# Patient Record
Sex: Female | Born: 1990
Health system: Southern US, Community
[De-identification: ages and names within clinical notes are randomized; demographics above are authoritative.]

## PROBLEM LIST (undated history)

## (undated) ENCOUNTER — Inpatient Hospital Stay (HOSPITAL_COMMUNITY): Payer: Self-pay

## (undated) DIAGNOSIS — R51 Headache: Secondary | ICD-10-CM

## (undated) DIAGNOSIS — T8859XA Other complications of anesthesia, initial encounter: Secondary | ICD-10-CM

## (undated) DIAGNOSIS — R7303 Prediabetes: Secondary | ICD-10-CM

## (undated) DIAGNOSIS — E669 Obesity, unspecified: Secondary | ICD-10-CM

## (undated) DIAGNOSIS — Z789 Other specified health status: Secondary | ICD-10-CM

## (undated) DIAGNOSIS — J189 Pneumonia, unspecified organism: Secondary | ICD-10-CM

## (undated) DIAGNOSIS — R519 Headache, unspecified: Secondary | ICD-10-CM

## (undated) HISTORY — PX: KNEE SURGERY: SHX244

## (undated) HISTORY — PX: INCISION AND DRAINAGE: SHX5863

## (undated) HISTORY — PX: ANTERIOR CRUCIATE LIGAMENT REPAIR: SHX115

---
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
## 2010-08-07 ENCOUNTER — Emergency Department (HOSPITAL_COMMUNITY): Admission: EM | Admit: 2010-08-07 | Discharge: 2010-08-07 | Payer: Self-pay | Admitting: Emergency Medicine

## 2010-12-09 ENCOUNTER — Emergency Department (HOSPITAL_COMMUNITY)
Admission: EM | Admit: 2010-12-09 | Discharge: 2010-12-09 | Payer: Self-pay | Source: Home / Self Care | Admitting: Family Medicine

## 2011-01-29 ENCOUNTER — Encounter: Payer: Self-pay | Admitting: *Deleted

## 2011-04-25 NOTE — Op Note (Signed)
Shannon Francis, Shannon Francis             ACCOUNT NO.:  [id number]   MEDICAL RECORD NO.:  [id number]          PATIENT TYPE:  AMB   LOCATION:  DSC                          FACILITY:  [facility]   PHYSICIAN:  Robert A. Thurston Hole, M.D. DATE OF BIRTH:  [birth year]   DATE OF PROCEDURE:  05/27/2007  DATE OF DISCHARGE:                               OPERATIVE REPORT   PREOPERATIVE DIAGNOSES:  1. Left knee anterior cruciate ligament deficiency.  2. Left knee medial lateral meniscal tears.   POSTOPERATIVE DIAGNOSES:  1. Left knee anterior cruciate ligament deficiency.  2. Left knee medial lateral meniscal tears.   PROCEDURE PERFORMED:  1. Left knee examination under anesthesia followed by arthroscopically      assisted endoscopic bone patellar tendon/bone allograft; anterior      cruciate ligament reconstruction using an 8 x 25 mm femoral      interference titanium screw, and an 8 x 25 mm tibial interference      BioScrew.  2. Left knee medial and lateral partial meniscectomies.   SURGEON:  Elana Alm. Thurston Hole, M.D.   ASSISTANT SURGEON:  Julien Girt, P.A.   ANESTHESIA:  General.   OPERATIVE TIME:  One hour and 30 minutes.   COMPLICATIONS:  None.   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  She  did well after this and then, over the last 3-6 months, had while  playing basketball again recurrent injuries to her left knee with  increased pain and swelling, with a new MRI scan showing ACL tear with  meniscal tearing.  She has failed conservative care and is now to  undergo arthroscopy with ACL reconstruction.   DESCRIPTION OF PROCEDURE:  Shannon Francis was brought into the operating room  on May 27, 2007.  A left anterior femoral block had been placed in the  holding  area by Anesthesia.  She was placed on the operative table in  the supine position.  After being placed under general anesthesia, her  left knee was examined.  She had full range of motion, 2 to 3+  Lachman's, positive pivot shift, and the knee stable to varus, valgus  and posterior stress, with normal patellar tracking.  The knee was  sterilely injected with 0.25% Marcaine with epinephrine.  She had  received Ancef 1 gm IV preoperatively for prophylaxis.  Her left leg was  then prepped using sterile DuraPrep and draped using sterile technique.   Originally through an anterolateral portal, the arthroscope with a pump  attached was placed, and through a anteromedial portal an arthroscope  probe was placed.  On initial inspection of the medial compartment, she  was found to have 25% grade III chondromalacia on the medial femoral  condyle which was debrided; only grade I-II changes medial tibial  plateau.  The medial meniscus, of which she had a previous partial  medial meniscectomy, had 75% remaining, and there was  tearing of another  25% of this posterior horn which was resected back to a stable rim.  The  intercondylar notch was inspected.  The ACL was found to be torn and  scarred and the notch with significant anterior laxity.  This was  thoroughly debrided and a notchplasty was performed.  The posterior  cruciate was intact and stable.   The lateral compartment was inspected, articular cartilage showed only  mild grade I-II chondromalacia.  The lateral meniscus showed a tear of  the posterior and lateral horn, of which 25% was resected back to a  stable rim.  The patellofemoral joint showed grade I-II chondromalacia.  The patella tracked normally.  The medial and lateral gutters had  moderate synovitis which was debrided.  Otherwise they were free of  pathology.   At this point, then, Julien Girt, whose expert surgical  assistance was necessary to prepare the ACL allograft on  the back table  while I proceeded to prepare the inside of the knee to accept the graft.  She prepared a 10 x 25 mm graft of patellar bone and tibial tubercle  bone in a tube 12 mm graft.  During this time I proceeded through a  separate anteromedial portal and a 1.5 cm anteromedial proximal tibial  incision using a tibial drill guide, a Steinmann pin was drilled up into  the ACL insertion point on the tibial plateau, and then overdrilled with  a 10 mm drill.  Through this tibial tunnel, the posterior femoral guide  was placed in the posterior femoral notch, the Steinmann drilled up into  the ACL origin point, and then overdrilled with a 10 mm drill to a depth  of 30 mm, leaving a posterior 2 mm of bone bridge.  A double Pentasa was  brought up through the tibial tunnel and joining up through the femoral  tunnel and through the femoral cortex and tied through a stab wound.  This was used to pass the ACL allograft up through the tibial tunnel and  joining into the femoral tunnel.  It was locked into position there with  an 8 x 25 mm interference titanium screw.   The knee was then brought through a full range of motion.  There was no  impingement of the graft.  The tibial bone plug was then looked in its  tunnel with an 8 x 25 mm interference BioScrew with the knee in 30  degrees of flexion and the tibia held reduced on the femur.  After this  was done, the knee was tested for stability.  Lachman's and pivot shift  were found to be totally eliminated, and the knee could be brought  through a full range of motion with no impingement on the graft.  At  this point it was felt that all pathology had been satisfactorily  addressed.  The instruments were removed.  The incisions were closed  with 0 and 2-0 Vicryl and 4-0 Prolene.  The arthroscopic portal was  closed with 3-0 nylon.  The knee was injected with 0.25% Marcaine with epinephrine and 4 mg of morphine.  Sterile dressings and a  long-leg  splint were applied, and then the patient was awakened and taken to the  recovery room in stable condition.   FOLLOW-UP:  Dannah will be followed overnight in the recovery care  center for IV pain control and neurovascular monitoring and CPM use.   DISPOSITION:  To be discharged tomorrow on Percocet and Robaxin with a  home  CPM.  She will be seen back in the office in a week for sutures out  and follow-up.      Robert A. Thurston Hole, M.D.  Electronically Signed     RAW/MEDQ  D:  05/27/2007  T:  05/27/2007  Job:  [id number]

## 2011-04-28 NOTE — Op Note (Signed)
Shannon Francis, Shannon Francis             ACCOUNT NO.:  [id number]   MEDICAL RECORD NO.:  [id number]          PATIENT TYPE:  AMB   LOCATION:  DSC                          FACILITY:  [facility]   PHYSICIAN:  Robert A. Thurston Hole, M.D. DATE OF BIRTH:  June 28, 1991   DATE OF PROCEDURE:  10/24/2004  DATE OF DISCHARGE:                                 OPERATIVE REPORT   PREOPERATIVE DIAGNOSES:  Left knee partial versus complete anterior cruciate  ligament tear with medial and lateral meniscal tears.   POSTOPERATIVE DIAGNOSES:  1.  Left knee lateral partial anterior cruciate ligament tear.  2.  Left knee medial and lateral meniscal tears.   PROCEDURE:  Left knee examination under anesthesia followed by arthroscopic  partial medial and lateral meniscectomies.   SURGEON:  Elana Alm. Thurston Hole, M.D.   ASSISTANT:  Julien Girt, P.A.   ANESTHESIA:  General.   OPERATIVE TIME:  30 minutes.   COMPLICATIONS:  None.   INDICATIONS FOR PROCEDURE:  Shannon Francis is a 20 year old who injured her left  knee approximately 2-1/2 months ago.  Significant pain with exam and MRI  documenting meniscal tearing and possible ACL tear, and is now to undergo  arthroscopy.   DESCRIPTION OF PROCEDURE:  Shannon Francis was brought to the operating room on  October 24, 2004, placed on the operating table in the supine position.  After an adequate level of general anesthesia was obtained, her left knee  was examined under anesthesia. Range of motion from 0-125 degrees, she had a  1-2+ Lachman but she did have an endpoint.  She had a negative pivot shift.  Her knee was stable to varus, valgus and posterior stress with normal  patellar tracking.  The knee was sterilely injected with 0.25% Marcaine with  epinephrine.  Left leg was prepped using sterile DuraPrep and draped using  sterile technique.   Originally, through an anterolateral portal, the arthroscope with a pump  attached was placed through an anteromedial portal and arthroscopic  probe  was placed.  On initial inspection of the medial compartment, the articular  cartilage was normal.  The medial meniscus showed a tear of the posterior  and medial horn, a complex type non-repairable tear, 50% of the posterior  horn was resected back to a stable rim. The intercondylar notch was  inspected, the anterior cruciate ligament had a partial tear of the  posteromedial bundle but not of the anterolateral bundle, and there was 3-4  mm of anxiety laxity; but there was a firm endpoint and thus I did not feel  an ACL reconstruction was indicated.  Posterior cruciate was intact and  stable.   The lateral compartment was then inspected. The articular cartilage was  normal.  Lateral meniscus showed a radial type tear of the posterolateral  corner of which 20% was resected back to a stable rim.   Patellofemoral joint was inspected. The articular cartilage in this joint  was normal.  The patella tracked normally.  The articular cartilage in the  lateral compartment was also inspected. This was found to be normal.  The medial and lateral gutters were free of  pathology.   After this was done, it was felt that all pathology had been satisfactorily  addressed. The instruments were removed. The portals were closed with 3-0  nylon suture.  Sterile dressings were applied.  The patient was awakened and  taken to the recovery room in stable condition.   FOLLOW UP CARE:  She will be treated as an outpatient on Vicodin and  Naprosyn. I will see her back in the office in a week for sutures out and  follow up.       RAW/MEDQ  D:  10/24/2004  T:  10/24/2004  Job:  [id number]

## 2011-05-09 ENCOUNTER — Emergency Department (HOSPITAL_COMMUNITY)
Admission: EM | Admit: 2011-05-09 | Discharge: 2011-05-10 | Disposition: A | Discharge status: Home / Self Care | Payer: Medicaid Other | Attending: Emergency Medicine | Admitting: Emergency Medicine

## 2011-05-09 DIAGNOSIS — IMO0002 Reserved for concepts with insufficient information to code with codable children: Secondary | ICD-10-CM | POA: Insufficient documentation

## 2011-05-09 DIAGNOSIS — H9209 Otalgia, unspecified ear: Secondary | ICD-10-CM | POA: Insufficient documentation

## 2011-05-09 DIAGNOSIS — S0003XA Contusion of scalp, initial encounter: Secondary | ICD-10-CM | POA: Insufficient documentation

## 2011-05-09 DIAGNOSIS — E669 Obesity, unspecified: Secondary | ICD-10-CM | POA: Insufficient documentation

## 2011-08-12 ENCOUNTER — Ambulatory Visit (INDEPENDENT_AMBULATORY_CARE_PROVIDER_SITE_OTHER): Payer: Medicaid Other

## 2011-08-12 ENCOUNTER — Inpatient Hospital Stay (INDEPENDENT_AMBULATORY_CARE_PROVIDER_SITE_OTHER)
Admission: RE | Admit: 2011-08-12 | Discharge: 2011-08-12 | Disposition: A | Discharge status: Home / Self Care | Payer: Medicaid Other | Source: Ambulatory Visit | Attending: Emergency Medicine | Admitting: Emergency Medicine

## 2011-08-12 DIAGNOSIS — S90129A Contusion of unspecified lesser toe(s) without damage to nail, initial encounter: Secondary | ICD-10-CM

## 2011-11-01 ENCOUNTER — Encounter: Payer: Self-pay | Admitting: *Deleted

## 2011-11-01 ENCOUNTER — Emergency Department (HOSPITAL_COMMUNITY): Payer: Medicaid Other

## 2011-11-01 ENCOUNTER — Emergency Department (HOSPITAL_COMMUNITY)
Admission: EM | Admit: 2011-11-01 | Discharge: 2011-11-01 | Disposition: A | Discharge status: Home / Self Care | Payer: Medicaid Other | Attending: Emergency Medicine | Admitting: Emergency Medicine

## 2011-11-01 DIAGNOSIS — Y92009 Unspecified place in unspecified non-institutional (private) residence as the place of occurrence of the external cause: Secondary | ICD-10-CM | POA: Insufficient documentation

## 2011-11-01 DIAGNOSIS — S161XXA Strain of muscle, fascia and tendon at neck level, initial encounter: Secondary | ICD-10-CM

## 2011-11-01 DIAGNOSIS — M545 Low back pain, unspecified: Secondary | ICD-10-CM | POA: Insufficient documentation

## 2011-11-01 DIAGNOSIS — Y998 Other external cause status: Secondary | ICD-10-CM | POA: Insufficient documentation

## 2011-11-01 DIAGNOSIS — S139XXA Sprain of joints and ligaments of unspecified parts of neck, initial encounter: Secondary | ICD-10-CM | POA: Insufficient documentation

## 2011-11-01 DIAGNOSIS — S86919A Strain of unspecified muscle(s) and tendon(s) at lower leg level, unspecified leg, initial encounter: Secondary | ICD-10-CM

## 2011-11-01 DIAGNOSIS — IMO0002 Reserved for concepts with insufficient information to code with codable children: Secondary | ICD-10-CM | POA: Insufficient documentation

## 2011-11-01 DIAGNOSIS — Z79899 Other long term (current) drug therapy: Secondary | ICD-10-CM | POA: Insufficient documentation

## 2011-11-01 MED ORDER — CYCLOBENZAPRINE HCL 10 MG PO TABS: 10.0000 mg | ORAL_TABLET | Freq: Two times a day (BID) | ORAL | Status: DC | PRN

## 2011-11-01 MED ORDER — HYDROCODONE-ACETAMINOPHEN 5-325 MG PO TABS: 2.0000 | ORAL_TABLET | ORAL | Status: DC | PRN

## 2011-11-01 NOTE — ED Notes (Signed)
Pt c/o left neck pain and left knee pain. No swelling or deformity noted. Pt is alert and oriented. Denies LOC. c Collar and spine board removed by dr. Rennis Chris on arrival.

## 2011-11-01 NOTE — ED Notes (Signed)
Patient transported to X-ray 
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
## 2011-11-09 ENCOUNTER — Emergency Department (HOSPITAL_COMMUNITY)
Admission: EM | Admit: 2011-11-09 | Discharge: 2011-11-10 | Disposition: A | Discharge status: Home / Self Care | Payer: Medicaid Other | Attending: Emergency Medicine | Admitting: Emergency Medicine

## 2011-11-09 ENCOUNTER — Encounter (HOSPITAL_COMMUNITY): Payer: Self-pay | Admitting: Emergency Medicine

## 2011-11-09 DIAGNOSIS — M79609 Pain in unspecified limb: Secondary | ICD-10-CM | POA: Insufficient documentation

## 2011-11-09 DIAGNOSIS — M545 Low back pain, unspecified: Secondary | ICD-10-CM | POA: Insufficient documentation

## 2011-11-09 DIAGNOSIS — M549 Dorsalgia, unspecified: Secondary | ICD-10-CM | POA: Insufficient documentation

## 2011-11-09 DIAGNOSIS — Z79899 Other long term (current) drug therapy: Secondary | ICD-10-CM | POA: Insufficient documentation

## 2011-11-09 HISTORY — DX: Obesity, unspecified: E66.9
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
## 2011-11-10 MED ORDER — CYCLOBENZAPRINE HCL 10 MG PO TABS
10.0000 mg | ORAL_TABLET | Freq: Once | ORAL | Status: AC
  Administered 2011-11-10: 10 mg via ORAL
  Filled 2011-11-10: qty 1

## 2011-11-10 MED ORDER — IBUPROFEN 800 MG PO TABS: 800.0000 mg | ORAL_TABLET | Freq: Three times a day (TID) | ORAL | Status: AC

## 2011-11-10 MED ORDER — CYCLOBENZAPRINE HCL 10 MG PO TABS: 10.0000 mg | ORAL_TABLET | Freq: Three times a day (TID) | ORAL | Status: AC | PRN

## 2011-11-10 MED ORDER — IBUPROFEN 800 MG PO TABS
800.0000 mg | ORAL_TABLET | Freq: Once | ORAL | Status: AC
  Administered 2011-11-10: 800 mg via ORAL
  Filled 2011-11-10: qty 1

## 2011-11-10 NOTE — ED Provider Notes (Signed)
History     CSN: [id number] Arrival date & time: 11/09/2011  7:33 PM   First MD Initiated Contact with Patient 11/10/11 0013      Chief Complaint  Patient presents with  . Back Pain  SUBJECTIVE:  Shannon Francis is a 20 y.o. female who complains of an injury causing low back pain 3 weeks ago. The pain is positional with bending or lifting, with radiation down the legs. Mechanism of injury: none. Symptoms have been constant since that time. Prior history of back problems: no prior back problems and recurrent self limited episodes of low back pain in the past. There is no numbness in the legs.  OBJECTIVE: Vital signs as noted above. Patient appears to be in mild to moderate pain, antalgic gait noted. Lumbosacral spine area reveals no local tenderness or mass. Painful and reduced LS ROM noted. Straight leg raise is positive at 30 degrees on right. DTR's, motor strength and sensation normal, including heel and toe gait.  Peripheral pulses are palpable. Lumbar spine X-Ray: not indicated.   ASSESSMENT:  lumbar strain  PLAN: For acute pain, rest, intermittent application of cold packs (later, may switch to heat, but do not sleep on heating pad), analgesics and muscle relaxants are recommended. Discussed longer term treatment plan of prn NSAID's and discussed a home back care exercise program with flexion exercise routine. Proper lifting with avoidance of heavy lifting discussed. Consider Physical Therapy and XRay studies if not improving. Call or return to clinic prn if these symptoms worsen or fail to improve as anticipated.  HPI  Past Medical History  Diagnosis Date  . Obesity     Past Surgical History  Procedure Date  . Knee surgery     History reviewed. No pertinent family history.  History  Substance Use Topics  . Smoking status: Never Smoker   . Smokeless tobacco: Not on file  . Alcohol Use: No    OB History    Grav Para Term Preterm Abortions TAB SAB Ect Mult Living                 Review of Systems  Constitutional: Negative for fever and chills.  HENT: Negative.   Respiratory: Negative.   Cardiovascular: Negative.   Gastrointestinal: Negative.  Negative for nausea.  Musculoskeletal: Positive for back pain.  Skin: Negative.   Neurological: Negative.  Negative for numbness and headaches.    Allergies  Review of patient's allergies indicates no known allergies.  Home Medications   Current Outpatient Rx  Name Route Sig Dispense Refill  . CYCLOBENZAPRINE HCL 10 MG PO TABS Oral Take 10 mg by mouth 2 (two) times daily as needed. FOR MUSCLE SPASMS     . HYDROCODONE-ACETAMINOPHEN 5-325 MG PO TABS Oral Take 2 tablets by mouth every 4 (four) hours as needed. FOR PAIN       BP 122/91  Pulse 97  Temp(Src) 98.3 F (36.8 C) (Oral)  Resp 18  SpO2 99%  LMP 10/09/2011  Physical Exam  Constitutional: She appears well-developed and well-nourished.  HENT:  Head: Normocephalic.  Neck: Normal range of motion. Neck supple.  Pulmonary/Chest: Effort normal.  Abdominal: Soft. Bowel sounds are normal. There is no tenderness. There is no rebound and no guarding.  Musculoskeletal: Normal range of motion. She exhibits no edema.       Mild lumbar and paralumbar tenderness without discoloration.  Neurological: She is alert. No cranial nerve deficit. Coordination normal.  Skin: Skin is warm and dry. No rash  noted.  Psychiatric: She has a normal mood and affect.    ED Course  Procedures (including critical care time)  Labs Reviewed - No data to display No results found.   No diagnosis found.    MDM          Rodena Medin, PA 11/10/11 [phone]

## 2011-11-10 NOTE — ED Provider Notes (Signed)
Medical screening examination/treatment/procedure(s) were performed by non-physician practitioner and as supervising physician I was immediately available for consultation/collaboration.   Lyanne Co, MD 11/10/11 [phone]

## 2011-12-01 ENCOUNTER — Emergency Department (INDEPENDENT_AMBULATORY_CARE_PROVIDER_SITE_OTHER)
Admission: EM | Admit: 2011-12-01 | Discharge: 2011-12-01 | Disposition: A | Discharge status: Home / Self Care | Payer: Medicaid Other | Source: Home / Self Care | Attending: Emergency Medicine | Admitting: Emergency Medicine

## 2011-12-01 ENCOUNTER — Encounter (HOSPITAL_COMMUNITY): Payer: Self-pay | Admitting: *Deleted

## 2011-12-01 DIAGNOSIS — N760 Acute vaginitis: Secondary | ICD-10-CM

## 2011-12-01 DIAGNOSIS — A499 Bacterial infection, unspecified: Secondary | ICD-10-CM

## 2011-12-01 DIAGNOSIS — L600 Ingrowing nail: Secondary | ICD-10-CM

## 2011-12-01 DIAGNOSIS — B9689 Other specified bacterial agents as the cause of diseases classified elsewhere: Secondary | ICD-10-CM

## 2011-12-01 LAB — POCT URINALYSIS DIP (DEVICE)
Ketones, ur: NEGATIVE mg/dL
Specific Gravity, Urine: 1.025 (ref 1.005–1.030)
pH: 6.5 (ref 5.0–8.0)

## 2011-12-01 LAB — POCT PREGNANCY, URINE: Preg Test, Ur: NEGATIVE

## 2011-12-01 LAB — WET PREP, GENITAL

## 2011-12-01 MED ORDER — MUPIROCIN 2 % EX OINT: TOPICAL_OINTMENT | Freq: Three times a day (TID) | CUTANEOUS | Status: AC

## 2011-12-01 MED ORDER — METRONIDAZOLE 500 MG PO TABS: 500.0000 mg | ORAL_TABLET | Freq: Two times a day (BID) | ORAL | Status: AC

## 2011-12-01 MED ORDER — CEPHALEXIN 500 MG PO CAPS: 500.0000 mg | ORAL_CAPSULE | Freq: Three times a day (TID) | ORAL | Status: AC

## 2011-12-01 NOTE — ED Notes (Signed)
Pt  Has symptoms  Of  Vaginal bleeding  And  White  Discharge     Which  She  Noticed  Last  Week  -  She  Also    Reports  She  stunbbed  Her l  Big toe  yest    And  It is  painfull

## 2011-12-01 NOTE — ED Provider Notes (Signed)
History     CSN: [id number]  Arrival date & time 12/01/11  1743   First MD Initiated Contact with Patient 12/01/11 1749      Chief Complaint  Patient presents with  . Vaginal Discharge    (Consider location/radiation/quality/duration/timing/severity/associated sxs/prior treatment) HPI Comments: [name] is a 20 year old female who is in today for 2 problems: Vaginal discharge and left great toe pain.  The vaginal discharge has been going on for a week. The discharge is white and she denies any odor, itching, burning, or pain. She has no external lesions. No pelvic pain, fever, chills, nausea, or vomiting. Her menses are somewhat irregular. Last menstrual period was November 24. She is sexually active and uses condoms for birth control although not consistently.  The second problem is pain over the lateral nail fold of left great toe. This seemed to come on yesterday. She thinks she might of stubbed it. It's not been draining a pus. It has been tender to touch.  Patient is a 20 y.o. female presenting with vaginal discharge.  Vaginal Discharge Pertinent negatives include no abdominal pain.    Past Medical History  Diagnosis Date  . Obesity     Past Surgical History  Procedure Date  . Knee surgery     History reviewed. No pertinent family history.  History  Substance Use Topics  . Smoking status: Never Smoker   . Smokeless tobacco: Not on file  . Alcohol Use: No    OB History    Grav Para Term Preterm Abortions TAB SAB Ect Mult Living                  Review of Systems  Constitutional: Negative for fever and chills.  Gastrointestinal: Negative for nausea, vomiting, abdominal pain and diarrhea.  Genitourinary: Positive for vaginal discharge. Negative for dysuria, urgency, frequency, hematuria, vaginal bleeding, difficulty urinating, genital sores, vaginal pain, menstrual problem, pelvic pain and dyspareunia.    Allergies  Hydrocodone  Home Medications    Current Outpatient Rx  Name Route Sig Dispense Refill  . CEPHALEXIN 500 MG PO CAPS Oral Take 1 capsule (500 mg total) by mouth 3 (three) times daily. 30 capsule 0  . METRONIDAZOLE 500 MG PO TABS Oral Take 1 tablet (500 mg total) by mouth 2 (two) times daily. 14 tablet 0  . MUPIROCIN 2 % EX OINT Topical Apply topically 3 (three) times daily. 22 g 0    BP 139/94  Pulse 74  Temp(Src) 98.3 F (36.8 C) (Oral)  Resp 20  SpO2 100%  Physical Exam  Nursing note and vitals reviewed. Constitutional: She appears well-developed and well-nourished. No distress.  Cardiovascular: Normal rate, regular rhythm, normal heart sounds and intact distal pulses.  Exam reveals no gallop and no friction rub.   No murmur heard. Pulmonary/Chest: Effort normal and breath sounds normal. No respiratory distress. She has no wheezes. She has no rales.  Abdominal: Soft. Bowel sounds are normal. She exhibits no distension and no mass. There is no tenderness. There is no rebound and no guarding.  Genitourinary: Uterus normal. There is no rash or lesion on the right labia. There is no rash or lesion on the left labia. Uterus is not deviated, not enlarged, not fixed and not tender. Cervix exhibits no motion tenderness, no discharge and no friability. Right adnexum displays no mass and no tenderness. Left adnexum displays no mass and no tenderness. No erythema, tenderness or bleeding around the vagina. No foreign body around the vagina. Vaginal discharge  found.       Pelvic exam reveals normal external genitalia. There was a small amount of dark blood in the vaginal vault. There was no clotting. She has very little discharge but did have some odor. The cervix otherwise appeared normal. There was no cervical motion tenderness. Uterus was midposition and nontender to palpation. No adnexal masses or tenderness.  Musculoskeletal: She exhibits tenderness.       Exam of the left great toe reveals an ingrown toenail with slight  inflammation and pain to palpation over the lateral nail fold. There was no purulent drainage and no granulation tissue.  Skin: Skin is warm and dry. No rash noted. She is not diaphoretic.    ED Course  Procedures (including critical care time)  Results for orders placed during the hospital encounter of 12/01/11  POCT URINALYSIS DIP (DEVICE)      Component Value Range   Glucose, UA NEGATIVE  NEGATIVE (mg/dL)   Bilirubin Urine SMALL (*) NEGATIVE    Ketones, ur NEGATIVE  NEGATIVE (mg/dL)   Specific Gravity, Urine 1.025  1.005 - 1.030    Hgb urine dipstick LARGE (*) NEGATIVE    pH 6.5  5.0 - 8.0    Protein, ur 100 (*) NEGATIVE (mg/dL)   Urobilinogen, UA 1.0  0.0 - 1.0 (mg/dL)   Nitrite NEGATIVE  NEGATIVE    Leukocytes, UA TRACE (*) NEGATIVE   POCT PREGNANCY, URINE      Component Value Range   Preg Test, Ur NEGATIVE       Labs Reviewed  POCT URINALYSIS DIP (DEVICE) - Abnormal; Notable for the following:    Bilirubin Urine SMALL (*)    Hgb urine dipstick LARGE (*)    Protein, ur 100 (*)    Leukocytes, UA TRACE (*) Biochemical Testing Only. Please order routine urinalysis from main lab if confirmatory testing is needed.   All other components within normal limits  POCT PREGNANCY, URINE  POCT URINALYSIS DIPSTICK  POCT PREGNANCY, URINE  GC/CHLAMYDIA PROBE AMP, GENITAL  WET PREP, GENITAL   No results found.   1. Bacterial vaginosis   2. Ingrown toenail       MDM  Her discharge appears to be do to bacterial vaginosis. We'll treat with metronidazole and I told her we would give her a call if anything else shows up.  She has a mild ingrown toenail. She hasn't tried any conservative treatment yet, so we'll treat with warm soaks, cephalexin, and mupirocin ointment. We will told her if no better in 10 days to return again for nail splitting procedure.        Roque Lias, MD 12/01/11 [phone]

## 2011-12-02 LAB — GC/CHLAMYDIA PROBE AMP, GENITAL: GC Probe Amp, Genital: NEGATIVE

## 2011-12-10 ENCOUNTER — Encounter (HOSPITAL_COMMUNITY): Payer: Self-pay | Admitting: *Deleted

## 2011-12-10 ENCOUNTER — Emergency Department (HOSPITAL_COMMUNITY)
Admission: EM | Admit: 2011-12-10 | Discharge: 2011-12-10 | Disposition: A | Discharge status: Home / Self Care | Payer: Medicaid Other | Attending: Emergency Medicine | Admitting: Emergency Medicine

## 2011-12-10 DIAGNOSIS — R509 Fever, unspecified: Secondary | ICD-10-CM | POA: Insufficient documentation

## 2011-12-10 DIAGNOSIS — H9201 Otalgia, right ear: Secondary | ICD-10-CM

## 2011-12-10 DIAGNOSIS — R51 Headache: Secondary | ICD-10-CM | POA: Insufficient documentation

## 2011-12-10 DIAGNOSIS — R070 Pain in throat: Secondary | ICD-10-CM | POA: Insufficient documentation

## 2011-12-10 DIAGNOSIS — J069 Acute upper respiratory infection, unspecified: Secondary | ICD-10-CM | POA: Insufficient documentation

## 2011-12-10 DIAGNOSIS — J3489 Other specified disorders of nose and nasal sinuses: Secondary | ICD-10-CM | POA: Insufficient documentation

## 2011-12-10 DIAGNOSIS — H9209 Otalgia, unspecified ear: Secondary | ICD-10-CM | POA: Insufficient documentation

## 2011-12-10 MED ORDER — ANTIPYRINE-BENZOCAINE 5.4-1.4 % OT SOLN
3.0000 [drp] | Freq: Once | OTIC | Status: AC
  Administered 2011-12-10: 4 [drp] via OTIC
  Filled 2011-12-10: qty 10

## 2011-12-10 MED ORDER — PSEUDOEPHEDRINE HCL 60 MG PO TABS: 60.0000 mg | ORAL_TABLET | Freq: Four times a day (QID) | ORAL | Status: AC | PRN

## 2011-12-10 MED ORDER — PSEUDOEPHEDRINE HCL 60 MG PO TABS
60.0000 mg | ORAL_TABLET | Freq: Once | ORAL | Status: AC
  Administered 2011-12-10: 60 mg via ORAL
  Filled 2011-12-10: qty 1

## 2011-12-10 NOTE — ED Notes (Signed)
Rx given x1 D/c instructions reviewed w/ pt - pt denies any further questions or concerns at present.

## 2011-12-10 NOTE — ED Notes (Signed)
Pt reports rt ear and rt side throat pain that began on 12/25 - pt denies any drainage. Pt admits to some fever.

## 2011-12-10 NOTE — ED Provider Notes (Signed)
Medical screening examination/treatment/procedure(s) were performed by non-physician practitioner and as supervising physician I was immediately available for consultation/collaboration.   Vida Roller, MD 12/10/11 0800

## 2011-12-10 NOTE — ED Provider Notes (Signed)
History     CSN: [id number]  Arrival date & time 12/10/11  0208   First MD Initiated Contact with Patient 12/10/11 0249      Chief Complaint  Patient presents with  . Otalgia  . Sore Throat  . Fever  . Headache    (Consider location/radiation/quality/duration/timing/severity/associated sxs/prior treatment) HPI Comments: Patient with history of URI symptoms for the last 5 days more congestion on the right than the left noticed that her right ear hurts, especially when she is laying on it on has taken numerous over-the-counter products one or 2 times without relief.  States she had a fever with chills, although in the emergency department.  She is normothermic  Patient is a 20 y.o. female presenting with ear pain, pharyngitis, fever, and headaches. The history is provided by the patient.  Otalgia This is a new problem. The current episode started more than 2 days ago. There is pain in the right ear. The problem occurs constantly. The problem has not changed since onset.There has been no fever. The pain is at a severity of 2/10. The pain is mild. Associated symptoms include rhinorrhea. Pertinent negatives include no headaches, no hearing loss, no sore throat, no neck pain and no cough. Her past medical history does not include chronic ear infection.  Sore Throat Associated symptoms include congestion and a fever. Pertinent negatives include no coughing, headaches, myalgias, neck pain or sore throat.  Fever Primary symptoms of the febrile illness include fever. Primary symptoms do not include headaches, cough, shortness of breath or myalgias.  Headache  Associated symptoms include a fever. Pertinent negatives include no shortness of breath.    Past Medical History  Diagnosis Date  . Obesity     Past Surgical History  Procedure Date  . Knee surgery     History reviewed. No pertinent family history.  History  Substance Use Topics  . Smoking status: Never Smoker   . Smokeless  tobacco: Not on file  . Alcohol Use: No    OB History    Grav Para Term Preterm Abortions TAB SAB Ect Mult Living                  Review of Systems  Constitutional: Positive for fever.  HENT: Positive for ear pain, congestion and rhinorrhea. Negative for hearing loss, sore throat and neck pain.   Respiratory: Negative for cough and shortness of breath.   Cardiovascular: Negative.   Musculoskeletal: Negative for myalgias.  Skin: Negative.   Neurological: Negative for dizziness and headaches.  Hematological: Negative for adenopathy.    Allergies  Hydrocodone  Home Medications   Current Outpatient Rx  Name Route Sig Dispense Refill  . CEPHALEXIN 500 MG PO CAPS Oral Take 1 capsule (500 mg total) by mouth 3 (three) times daily. 30 capsule 0  . IBUPROFEN 200 MG PO TABS Oral Take 600 mg by mouth every 6 (six) hours as needed.      Marland Kitchen METRONIDAZOLE 500 MG PO TABS Oral Take 1 tablet (500 mg total) by mouth 2 (two) times daily. 14 tablet 0  . PSEUDOEPHEDRINE HCL 60 MG PO TABS Oral Take 1 tablet (60 mg total) by mouth every 6 (six) hours as needed for congestion. 30 tablet 0    BP 136/84  Pulse 76  Temp(Src) 98.3 F (36.8 C) (Oral)  Resp 16  SpO2 100%  LMP 12/07/2011  Physical Exam  ED Course  Procedures (including critical care time)  Labs Reviewed - No data to  display No results found.   1. URI (upper respiratory infection)   2. Otalgia of right ear       MDM  Upper respiratory tract infection with right ear pain, sinus congestion        Arman Filter, NP 12/10/11 0301  Arman Filter, NP 12/10/11 1610  Arman Filter, NP 12/10/11 0305  Arman Filter, NP 12/10/11 [phone]

## 2011-12-10 NOTE — ED Notes (Signed)
Pt c/o sore throat, ear pain, fever and headache x 5 days.

## 2011-12-12 ENCOUNTER — Telehealth (HOSPITAL_COMMUNITY): Payer: Self-pay | Admitting: *Deleted

## 2012-04-15 ENCOUNTER — Emergency Department (HOSPITAL_COMMUNITY)
Admission: EM | Admit: 2012-04-15 | Discharge: 2012-04-15 | Disposition: A | Discharge status: Home / Self Care | Payer: Self-pay | Source: Home / Self Care

## 2012-07-15 ENCOUNTER — Inpatient Hospital Stay (HOSPITAL_COMMUNITY)
Admission: AD | Admit: 2012-07-15 | Discharge: 2012-07-15 | Disposition: A | Discharge status: Home / Self Care | Payer: Medicaid Other | Source: Ambulatory Visit | Attending: Obstetrics | Admitting: Obstetrics

## 2012-07-15 ENCOUNTER — Inpatient Hospital Stay (HOSPITAL_COMMUNITY): Payer: Medicaid Other

## 2012-07-15 DIAGNOSIS — N926 Irregular menstruation, unspecified: Secondary | ICD-10-CM

## 2012-07-15 DIAGNOSIS — N938 Other specified abnormal uterine and vaginal bleeding: Secondary | ICD-10-CM | POA: Insufficient documentation

## 2012-07-15 DIAGNOSIS — N939 Abnormal uterine and vaginal bleeding, unspecified: Secondary | ICD-10-CM

## 2012-07-15 DIAGNOSIS — N949 Unspecified condition associated with female genital organs and menstrual cycle: Secondary | ICD-10-CM | POA: Insufficient documentation

## 2012-07-15 LAB — CBC
MCH: 25.5 pg — ABNORMAL LOW (ref 26.0–34.0)
Platelets: 336 10*3/uL (ref 150–400)
RBC: 4.44 MIL/uL (ref 3.87–5.11)
RDW: 15 % (ref 11.5–15.5)

## 2012-07-15 LAB — URINALYSIS, ROUTINE W REFLEX MICROSCOPIC
Ketones, ur: NEGATIVE mg/dL
Leukocytes, UA: NEGATIVE
Nitrite: NEGATIVE
Protein, ur: 100 mg/dL — AB
Urobilinogen, UA: 1 mg/dL (ref 0.0–1.0)
pH: 6 (ref 5.0–8.0)

## 2012-07-15 LAB — WET PREP, GENITAL
Clue Cells Wet Prep HPF POC: NONE SEEN
WBC, Wet Prep HPF POC: NONE SEEN
Yeast Wet Prep HPF POC: NONE SEEN

## 2012-07-15 MED ORDER — NORGESTIMATE-ETH ESTRADIOL 0.25-35 MG-MCG PO TABS: ORAL_TABLET | ORAL | Status: DC

## 2012-07-15 NOTE — MAU Provider Note (Signed)
History     CSN: [id number]  Arrival date and time: 07/15/12 1352   First Provider Initiated Contact with Patient 07/15/12 1445      No chief complaint on file.  HPI  Pt is not pregnant and presents with irregular periods with LMP in June 2013 and has continued .  She is sexual active, not using anything for birth control and wants to be pregnant.  She denies vaginal discharge, itching or burning.  She was to be scheduled for an ultrasound.    She changes a pad every hour with clots.  She has some cramping.  Pt denies constipation, diarrhea, UTI symptoms or vaginal discharge.    Past Medical History  Diagnosis Date  . Obesity     Past Surgical History  Procedure Date  . Knee surgery     No family history on file.  History  Substance Use Topics  . Smoking status: Never Smoker   . Smokeless tobacco: Not on file  . Alcohol Use: No    Allergies:  Allergies  Allergen Reactions  . Hydrocodone Nausea And Vomiting    Prescriptions prior to admission  Medication Sig Dispense Refill  . ibuprofen (ADVIL,MOTRIN) 200 MG tablet Take 600 mg by mouth every 6 (six) hours as needed. For pain        Review of Systems  Constitutional: Negative for fever and chills.  Gastrointestinal: Negative for nausea, vomiting, abdominal pain, diarrhea and constipation.  Genitourinary: Negative for dysuria, urgency and frequency.  Neurological: Negative for headaches.   Physical Exam   There were no vitals taken for this visit.  Physical Exam  Vitals reviewed. Constitutional: She is oriented to person, place, and time. She appears well-developed and well-nourished. No distress.       Massively obese  HENT:  Head: Normocephalic.  Eyes: Pupils are equal, round, and reactive to light.  Neck: Normal range of motion. Neck supple.  Cardiovascular: Normal rate.   Respiratory: Effort normal.  GI: Soft. She exhibits no distension. There is no tenderness. There is no rebound and no guarding.    Genitourinary:       Small amount of bright red blood in vault; cervix clean, nullip, uterus and adnexa without palpable enlargement- bimanual difficult to assess due to habitus  Musculoskeletal: Normal range of motion.  Neurological: She is alert and oriented to person, place, and time.  Skin: Skin is warm and dry.  Psychiatric: She has a normal mood and affect.    MAU Course  Procedures Results for orders placed during the hospital encounter of 07/15/12 (from the past 24 hour(s))  URINALYSIS, ROUTINE W REFLEX MICROSCOPIC     Status: Abnormal   Collection Time   07/15/12  2:30 PM      Component Value Range   Color, Urine RED (*) YELLOW   APPearance CLOUDY (*) CLEAR   Specific Gravity, Urine >1.030 (*) 1.005 - 1.030   pH 6.0  5.0 - 8.0   Glucose, UA NEGATIVE  NEGATIVE mg/dL   Hgb urine dipstick LARGE (*) NEGATIVE   Bilirubin Urine SMALL (*) NEGATIVE   Ketones, ur NEGATIVE  NEGATIVE mg/dL   Protein, ur 409 (*) NEGATIVE mg/dL   Urobilinogen, UA 1.0  0.0 - 1.0 mg/dL   Nitrite NEGATIVE  NEGATIVE   Leukocytes, UA NEGATIVE  NEGATIVE  URINE MICROSCOPIC-ADD ON     Status: Abnormal   Collection Time   07/15/12  2:30 PM      Component Value Range   Squamous  Epithelial / LPF FEW (*) RARE   RBC / HPF TOO NUMEROUS TO COUNT  <3 RBC/hpf   Bacteria, UA FEW (*) RARE  WET PREP, GENITAL     Status: Normal   Collection Time   07/15/12  2:55 PM      Component Value Range   Yeast Wet Prep HPF POC NONE SEEN  NONE SEEN   Trich, Wet Prep NONE SEEN  NONE SEEN   Clue Cells Wet Prep HPF POC NONE SEEN  NONE SEEN   WBC, Wet Prep HPF POC NONE SEEN  NONE SEEN  CBC     Status: Abnormal   Collection Time   07/15/12  3:10 PM      Component Value Range   WBC 9.3  4.0 - 10.5 K/uL   RBC 4.44  3.87 - 5.11 MIL/uL   Hemoglobin 11.3 (*) 12.0 - 15.0 g/dL   HCT 96.0 (*) 45.4 - 09.8 %   MCV 80.0  78.0 - 100.0 fL   MCH 25.5 (*) 26.0 - 34.0 pg   MCHC 31.8  30.0 - 36.0 g/dL   RDW 11.9  14.7 - 82.9 %   Platelets 336   150 - 400 K/uL   Clinical Data: Abnormal uterine bleeding. Bleeding since June.  Premenopausal female.  TRANSABDOMINAL AND TRANSVAGINAL ULTRASOUND OF PELVIS  Technique: Both transabdominal and transvaginal ultrasound  examinations of the pelvis were performed. Transabdominal  technique was performed for global imaging of the pelvis including  uterus, ovaries, adnexal regions, and pelvic cul-de-sac.  It was necessary to proceed with endovaginal exam following the  transabdominal exam to visualize the endometrium and ovaries.  Comparison: None.  Findings:  Uterus: 7.8 x 3.5 x 4.5 cm. No fibroids or other uterine mass  identified.  Endometrium: Tiny cystic foci noted throughout endometrium. Double  layer thickness measures 17 mm transvaginally.  Right ovary: not directly visualized by transabdominal or  transvaginal sonography, however no adnexal mass identified.  Left ovary: 2.0 x 1.3 x 1.5 cm. Normal appearance.  Other Findings: No free fluid  IMPRESSION:  1. Diffusely thickened endometrium measuring 17 mm. Tinyinternal  cystic foci are suspicious for cystic hyperplasia. If bleeding  remains unresponsive to hormonal or medical therapy, focal lesion  work-up with sonohysterogram should be considered. Endometrial  biopsy should also be considered in pre-menopausal patients at high  risk for endometrial carcinoma. (Ref: Radiological Reasoning:  Algorithmic Workup of Abnormal Vaginal Bleeding with Endovaginal  Sonography and Sonohysterography. AJR 2008; 562:Z30-86).  2. Normal left ovary. Nonvisualization of right ovary.  3. No adnexal mass or free fluid identified.  Original Report Authenticated By: Danae Orleans, M.D.            External Result   Discussed findings with Dr. Gaynell Face Pt will go home on BCP and schedule an appointment for endometrial bx in Dr. Elsie Stain office   Assessment and Plan  Abnormal uterine bleeding- suspicious for hyperplasia BCP one every 4 hours  for 4 days the one BID for 7 days then one daily for one month F/u in Dr. Elsie Stain office for endometrial biopsy Morbid obesity  Neeraj Housand 07/15/2012, 2:45 PM

## 2012-07-16 LAB — GC/CHLAMYDIA PROBE AMP, GENITAL: GC Probe Amp, Genital: NEGATIVE

## 2012-08-08 ENCOUNTER — Encounter (HOSPITAL_COMMUNITY): Payer: Self-pay | Admitting: *Deleted

## 2012-08-08 ENCOUNTER — Emergency Department (HOSPITAL_COMMUNITY)
Admission: EM | Admit: 2012-08-08 | Discharge: 2012-08-09 | Disposition: A | Discharge status: Home / Self Care | Payer: Medicaid Other | Attending: Emergency Medicine | Admitting: Emergency Medicine

## 2012-08-08 DIAGNOSIS — N898 Other specified noninflammatory disorders of vagina: Secondary | ICD-10-CM | POA: Insufficient documentation

## 2012-08-08 DIAGNOSIS — R109 Unspecified abdominal pain: Secondary | ICD-10-CM | POA: Insufficient documentation

## 2012-08-08 DIAGNOSIS — E669 Obesity, unspecified: Secondary | ICD-10-CM | POA: Insufficient documentation

## 2012-08-08 DIAGNOSIS — N390 Urinary tract infection, site not specified: Secondary | ICD-10-CM | POA: Insufficient documentation

## 2012-08-08 DIAGNOSIS — N939 Abnormal uterine and vaginal bleeding, unspecified: Secondary | ICD-10-CM

## 2012-08-08 LAB — POCT PREGNANCY, URINE: Preg Test, Ur: NEGATIVE

## 2012-08-08 MED ORDER — OXYCODONE-ACETAMINOPHEN 5-325 MG PO TABS
1.0000 | ORAL_TABLET | Freq: Once | ORAL | Status: AC
  Administered 2012-08-09: 1 via ORAL
  Filled 2012-08-08: qty 1

## 2012-08-08 MED ORDER — ONDANSETRON 4 MG PO TBDP
8.0000 mg | ORAL_TABLET | Freq: Once | ORAL | Status: AC
  Administered 2012-08-09: 8 mg via ORAL
  Filled 2012-08-08: qty 2

## 2012-08-08 MED ORDER — NAPROXEN 250 MG PO TABS
500.0000 mg | ORAL_TABLET | Freq: Once | ORAL | Status: AC
  Administered 2012-08-09: 500 mg via ORAL
  Filled 2012-08-08: qty 2

## 2012-08-08 NOTE — ED Provider Notes (Signed)
History     CSN: [id number]  Arrival date & time 08/08/12  2209   First MD Initiated Contact with Patient 08/08/12 2323      Chief Complaint  Patient presents with  . Abdominal Pain    (Consider location/radiation/quality/duration/timing/severity/associated sxs/prior treatment) HPI Comments: Patient is a 21 year old obese female that presents emergency department with a chief complaint of suprapubic abdominal pain and vaginal bleeding.  Onset of current symptoms began around 9 p.m. this evening and are described as a sharp pressure-like sensation that is constant.  Severity was 10/10 but it has slightly improved now at 8/10.  Patient states that she did become diaphoretic when the pain started but this has since resolved.  She denies fevers, night sweats, chills, nausea, vomiting, syncope, dizziness, constipation or diarrhea. Pt was evaluated at [facility] on August [facility]th for irregular periods and abdominal pain. Results of [place] below. Pt was placed on birthcontrol and advised to follow up with Dr. Jerolyn Center in one month if symptoms persist for endometrial biopsy & sonohysterogram.   US transvaginal/pelvic IMPRESSION, 07/15/2012  1.  Diffusely thickened endometrium measuring 17 mm.  Tinyinternal cystic foci are suspicious for cystic hyperplasia. If bleeding remains unresponsive to hormonal or medical therapy, focal lesion work-up with sonohysterogram should be considered.  Endometrial biopsy should also be considered in pre-menopausal patients at high risk for endometrial carcinoma. (Ref:  Radiological Reasoning: Algorithmic Workup of Abnormal Vaginal Bleeding with Endovaginal Sonography and Sonohysterography. AJR 2008; 782:N56-21). 2.  Normal left ovary.  Nonvisualization of right ovary. 3.  No adnexal mass or free fluid identified.   Patient is a 21 y.o. female presenting with abdominal pain. The history is provided by the patient.  Abdominal Pain The primary symptoms of the illness include  abdominal pain and vaginal bleeding. The primary symptoms of the illness do not include fever, shortness of breath, nausea, vomiting, diarrhea, dysuria or vaginal discharge.  Additional symptoms associated with the illness include chills. Symptoms associated with the illness do not include constipation, urgency or hematuria.    Past Medical History  Diagnosis Date  . Obesity     Past Surgical History  Procedure Date  . Knee surgery     History reviewed. No pertinent family history.  History  Substance Use Topics  . Smoking status: Never Smoker   . Smokeless tobacco: Not on file  . Alcohol Use: No    OB History    Grav Para Term Preterm Abortions TAB SAB Ect Mult Living                  Review of Systems  Constitutional: Positive for chills. Negative for fever and appetite change.  HENT: Negative for neck stiffness and dental problem.   Eyes: Negative for visual disturbance.  Respiratory: Negative for cough, chest tightness, shortness of breath and wheezing.   Cardiovascular: Negative for chest pain.  Gastrointestinal: Positive for abdominal pain. Negative for nausea, vomiting, diarrhea, constipation, blood in stool, abdominal distention, anal bleeding and rectal pain.  Genitourinary: Positive for vaginal bleeding. Negative for dysuria, urgency, hematuria, flank pain and vaginal discharge.  Musculoskeletal: Negative for myalgias and arthralgias.  Skin: Negative for rash.  Neurological: Negative for dizziness, syncope, speech difficulty, numbness and headaches.  Hematological: Does not bruise/bleed easily.  All other systems reviewed and are negative.    Allergies  Hydrocodone  Home Medications   Current Outpatient Rx  Name Route Sig Dispense Refill  . IBUPROFEN 200 MG PO TABS Oral Take 600 mg by mouth  every 6 (six) hours as needed. For pain    . NORGESTIMATE-ETH ESTRADIOL 0.25-35 MG-MCG PO TABS Oral Take 1 tablet by mouth daily. Take 1 pill every 4 hours for 4 days  then one 2 times a day for 7 days and then one daily for one month; Start date 07/15/12    . HYDROCODONE-ACETAMINOPHEN 5-325 MG PO TABS Oral Take 1 tablet by mouth every 6 (six) hours as needed for pain. 15 tablet 0  . NAPROXEN 500 MG PO TABS Oral Take 1 tablet (500 mg total) by mouth 2 (two) times daily. 30 tablet 0    BP 143/86  Temp 98.3 F (36.8 C) (Oral)  Resp 20  SpO2 100%  LMP 07/08/2012  Physical Exam  Constitutional: She is oriented to person, place, and time. She appears well-developed and well-nourished. No distress.  HENT:  Head: Normocephalic and atraumatic.  Mouth/Throat: Oropharynx is clear and moist. No oropharyngeal exudate.  Eyes: Conjunctivae and EOM are normal. Pupils are equal, round, and reactive to light. No scleral icterus.  Neck: Normal range of motion. Neck supple. No tracheal deviation present. No thyromegaly present.  Cardiovascular: Normal rate, regular rhythm, normal heart sounds and intact distal pulses.   Pulmonary/Chest: Effort normal and breath sounds normal. No stridor. No respiratory distress. She has no wheezes.  Abdominal: Soft. Bowel sounds are normal. She exhibits no abdominal bruit and no pulsatile midline mass. There is tenderness.    Genitourinary:       Bimanual exam chaperoned. Unable to asses much d/t body habitus. No obvious masses palpable. Positive gross blood. No Cervical motion tenderness  Musculoskeletal: Normal range of motion. She exhibits no edema and no tenderness.  Neurological: She is alert and oriented to person, place, and time. Coordination normal.  Skin: Skin is warm and dry. No rash noted. She is not diaphoretic. No erythema. No pallor.  Psychiatric: She has a normal mood and affect. Her behavior is normal.    ED Course  Procedures (including critical care time)  Labs Reviewed  POCT I-STAT, CHEM 8 - Abnormal; Notable for the following:    Hemoglobin 11.9 (*)     HCT 35.0 (*)     All other components within normal  limits  POCT PREGNANCY, URINE  URINALYSIS, ROUTINE W REFLEX MICROSCOPIC   US Transvaginal Non-ob  08/09/2012  *RADIOLOGY REPORT*  Clinical Data: Vaginal bleeding.  Pain.  TRANSABDOMINAL AND TRANSVAGINAL ULTRASOUND OF PELVIS Technique:  Both transabdominal and transvaginal ultrasound examinations of the pelvis were performed. Transabdominal technique was performed for global imaging of the pelvis including uterus, ovaries, adnexal regions, and pelvic cul-de-sac.  It was necessary to proceed with endovaginal exam following the transabdominal exam to visualize the endometrium.  Comparison:  Pelvic ultrasound 07/15/2012.  Findings:  Uterus: Measures 8.2 x 4.3 x 4.9 cm.  No focal lesion.  Endometrium: Measures 13.4 mm in thickness compared to 17 mm on the prior study.  Small hypoechoic foci within the endometrial canal are less conspicuous.  Right ovary:  Not visualized.  Left ovary: Not visualized.  Other findings: No free fluid  IMPRESSION:  1. Some decrease in endometrial stripe thickness with decreased conspicuity of tiny cystic foci within the endometrial canal.  No new abnormality.  As on the prior study, if bleeding remains unresponsive to hormonal or medical therapy, focal lesion workup with sonohysterogram should be considered.  Endometrial biopsy should also be considered in a premenopausal patient is at high risk for endometrial carcinoma. 2.  Ovaries are not  visualized.   Original Report Authenticated By: Bernadene Bell. Maricela Curet, M.D.    US Pelvis Complete  08/09/2012  *RADIOLOGY REPORT*  Clinical Data: Vaginal bleeding.  Pain.  TRANSABDOMINAL AND TRANSVAGINAL ULTRASOUND OF PELVIS Technique:  Both transabdominal and transvaginal ultrasound examinations of the pelvis were performed. Transabdominal technique was performed for global imaging of the pelvis including uterus, ovaries, adnexal regions, and pelvic cul-de-sac.  It was necessary to proceed with endovaginal exam following the transabdominal exam to  visualize the endometrium.  Comparison:  Pelvic ultrasound 07/15/2012.  Findings:  Uterus: Measures 8.2 x 4.3 x 4.9 cm.  No focal lesion.  Endometrium: Measures 13.4 mm in thickness compared to 17 mm on the prior study.  Small hypoechoic foci within the endometrial canal are less conspicuous.  Right ovary:  Not visualized.  Left ovary: Not visualized.  Other findings: No free fluid  IMPRESSION:  1. Some decrease in endometrial stripe thickness with decreased conspicuity of tiny cystic foci within the endometrial canal.  No new abnormality.  As on the prior study, if bleeding remains unresponsive to hormonal or medical therapy, focal lesion workup with sonohysterogram should be considered.  Endometrial biopsy should also be considered in a premenopausal patient is at high risk for endometrial carcinoma. 2.  Ovaries are not visualized.   Original Report Authenticated By: Bernadene Bell. D'ALESSIO, M.D.      1. Vaginal bleeding   2. Abdominal pain       MDM  Abdominal pain & vaginal bleeding / UTI  Pt presents to ER c/o suprapubic abd pain and vaginal bleeding that has been previously evaluated at [facility] earlier this month. Pt at that time was placed on birthcontrol which she states she has been taking as directed. Pt has been re-US today bc of new onset of acute worse abdominal pain to assure no torsion or abscess of ovaries present.  Pt has been advised to follow up with Dr. Ashley Royalty as soon as possible for further evaluation. Dc with pain medication, return precautions discussed. Pt verbalizes understanding and is agreeable with plan. (Pt dc w Keflex for UTI)        Jaci Carrel, PA-C 08/09/12 2349

## 2012-08-08 NOTE — ED Notes (Signed)
Pt reports suprapubic pain and abnormal vaginal bleeding starting approx 2100 tonight. Pt denies N/V/D, vaginal odor, vaginal d/c, or burning w/urination. Pt reports LMP July 01 2012.

## 2012-08-08 NOTE — ED Notes (Signed)
Pt c/o mid-lower abdominal pain since 9 pm.  C/o vaginal bleeding as well. Denies n/v/d.

## 2012-08-09 ENCOUNTER — Emergency Department (HOSPITAL_COMMUNITY): Payer: Medicaid Other

## 2012-08-09 LAB — URINALYSIS, ROUTINE W REFLEX MICROSCOPIC
Bilirubin Urine: NEGATIVE
Glucose, UA: NEGATIVE mg/dL
Specific Gravity, Urine: 1.025 (ref 1.005–1.030)
pH: 5.5 (ref 5.0–8.0)

## 2012-08-09 LAB — POCT I-STAT, CHEM 8
BUN: 16 mg/dL (ref 6–23)
Creatinine, Ser: 1.1 mg/dL (ref 0.50–1.10)
Potassium: 4 mEq/L (ref 3.5–5.1)
Sodium: 143 mEq/L (ref 135–145)

## 2012-08-09 LAB — URINE MICROSCOPIC-ADD ON

## 2012-08-09 MED ORDER — OXYCODONE-ACETAMINOPHEN 5-325 MG PO TABS
1.0000 | ORAL_TABLET | Freq: Once | ORAL | Status: AC
  Administered 2012-08-09: 1 via ORAL
  Filled 2012-08-09: qty 1

## 2012-08-09 MED ORDER — NAPROXEN 500 MG PO TABS: 500.0000 mg | ORAL_TABLET | Freq: Two times a day (BID) | ORAL | Status: AC

## 2012-08-09 MED ORDER — CEPHALEXIN 500 MG PO CAPS: 500.0000 mg | ORAL_CAPSULE | Freq: Four times a day (QID) | ORAL | Status: AC

## 2012-08-09 MED ORDER — HYDROCODONE-ACETAMINOPHEN 5-325 MG PO TABS: 1.0000 | ORAL_TABLET | Freq: Four times a day (QID) | ORAL | Status: AC | PRN

## 2012-08-09 NOTE — ED Notes (Signed)
Patient transported to Ultrasound 

## 2012-08-10 LAB — URINE CULTURE: Colony Count: >=100000

## 2012-08-11 NOTE — ED Notes (Signed)
+  Urine. Patient treated with Keflex. Sensitive to same. Per protocol MD. °

## 2012-08-13 NOTE — ED Provider Notes (Signed)
Medical screening examination/treatment/procedure(s) were performed by non-physician practitioner and as supervising physician I was immediately available for consultation/collaboration.  Sunnie Nielsen, MD 08/13/12 [phone]

## 2013-12-10 ENCOUNTER — Encounter (HOSPITAL_COMMUNITY): Payer: Self-pay | Admitting: Emergency Medicine

## 2013-12-10 ENCOUNTER — Emergency Department (HOSPITAL_COMMUNITY)
Admission: EM | Admit: 2013-12-10 | Discharge: 2013-12-10 | Disposition: A | Discharge status: Home / Self Care | Payer: No Typology Code available for payment source | Attending: Emergency Medicine | Admitting: Emergency Medicine

## 2013-12-10 DIAGNOSIS — R509 Fever, unspecified: Secondary | ICD-10-CM | POA: Insufficient documentation

## 2013-12-10 DIAGNOSIS — E669 Obesity, unspecified: Secondary | ICD-10-CM | POA: Insufficient documentation

## 2013-12-10 DIAGNOSIS — Z3202 Encounter for pregnancy test, result negative: Secondary | ICD-10-CM | POA: Insufficient documentation

## 2013-12-10 DIAGNOSIS — R112 Nausea with vomiting, unspecified: Secondary | ICD-10-CM | POA: Insufficient documentation

## 2013-12-10 LAB — POCT PREGNANCY, URINE: Preg Test, Ur: NEGATIVE

## 2013-12-10 LAB — POCT I-STAT, CHEM 8
Calcium, Ion: 1.19 mmol/L (ref 1.12–1.23)
Chloride: 101 mEq/L (ref 96–112)
Glucose, Bld: 94 mg/dL (ref 70–99)
HCT: 39 % (ref 36.0–46.0)
Hemoglobin: 13.3 g/dL (ref 12.0–15.0)
Potassium: 3.6 mEq/L — ABNORMAL LOW (ref 3.7–5.3)

## 2013-12-10 LAB — URINALYSIS, ROUTINE W REFLEX MICROSCOPIC
Glucose, UA: NEGATIVE mg/dL
Ketones, ur: NEGATIVE mg/dL
Nitrite: NEGATIVE
Protein, ur: 30 mg/dL — AB
pH: 6 (ref 5.0–8.0)

## 2013-12-10 LAB — URINE MICROSCOPIC-ADD ON

## 2013-12-10 LAB — CBC WITH DIFFERENTIAL/PLATELET
Basophils Absolute: 0 10*3/uL (ref 0.0–0.1)
Basophils Relative: 0 % (ref 0–1)
HCT: 36.2 % (ref 36.0–46.0)
Lymphocytes Relative: 35 % (ref 12–46)
MCHC: 33.1 g/dL (ref 30.0–36.0)
Neutro Abs: 4.1 10*3/uL (ref 1.7–7.7)
Platelets: 352 10*3/uL (ref 150–400)
RBC: 4.58 MIL/uL (ref 3.87–5.11)
RDW: 15.1 % (ref 11.5–15.5)
WBC: 7.3 10*3/uL (ref 4.0–10.5)

## 2013-12-10 MED ORDER — ONDANSETRON 8 MG PO TBDP
8.0000 mg | ORAL_TABLET | Freq: Once | ORAL | Status: AC
  Administered 2013-12-10: 8 mg via ORAL
  Filled 2013-12-10: qty 1

## 2013-12-10 MED ORDER — OMEPRAZOLE 20 MG PO CPDR: 20.0000 mg | DELAYED_RELEASE_CAPSULE | Freq: Every day | ORAL | Status: DC

## 2013-12-10 MED ORDER — ONDANSETRON 8 MG PO TBDP: ORAL_TABLET | ORAL | Status: DC

## 2013-12-10 MED ORDER — FAMOTIDINE 20 MG PO TABS: 20.0000 mg | ORAL_TABLET | Freq: Two times a day (BID) | ORAL | Status: DC

## 2013-12-10 MED ORDER — GI COCKTAIL ~~LOC~~
30.0000 mL | Freq: Once | ORAL | Status: AC
  Administered 2013-12-10: 30 mL via ORAL
  Filled 2013-12-10: qty 30

## 2013-12-10 NOTE — ED Provider Notes (Signed)
CSN: [id number]     Arrival date & time 12/10/13  0252 History   First MD Initiated Contact with Patient 12/10/13 0444     Chief Complaint  Patient presents with  . Abdominal Pain   HPI  History provided by the patient. Patient is a 22 year old female with no significant PMH who presents with complaints of nausea vomiting and abdominal discomfort. Patient states she began to feel nauseous with vomiting symptoms yesterday morning. This was followed by intermittent episodes of sharp abdominal pains generally in the central abdomen area. Patient states she has felt like she has had to use the bathroom at bowel movements but has not had any diarrhea or constipation. She denies any urinary symptoms. No dysuria, hematuria urinary frequency. She is currently menstruating without any other abnormalities or discharge. Denies any lower abdomen or pelvic pains. She does report some subjective fevers at home. She has not used any medicines for her symptoms. No other aggravating or alleviating factors. No other associated symptoms.    Past Medical History  Diagnosis Date  . Obesity    Past Surgical History  Procedure Laterality Date  . Knee surgery     History reviewed. No pertinent family history. History  Substance Use Topics  . Smoking status: Never Smoker   . Smokeless tobacco: Not on file  . Alcohol Use: No   OB History   Grav Para Term Preterm Abortions TAB SAB Ect Mult Living                 Review of Systems  Constitutional: Positive for fever. Negative for chills and diaphoresis.  Gastrointestinal: Positive for nausea, vomiting and abdominal pain. Negative for diarrhea and constipation.  Genitourinary: Negative for dysuria, frequency, hematuria and flank pain.  All other systems reviewed and are negative.    Allergies  Hydrocodone  Home Medications  No current outpatient prescriptions on file. BP 141/74  Pulse 77  Temp(Src) 98.1 F (36.7 C) (Oral)  Resp 16  SpO2 100%   LMP 11/09/2013 Physical Exam  Nursing note and vitals reviewed. Constitutional: She is oriented to person, place, and time. She appears well-developed and well-nourished. No distress.  HENT:  Head: Normocephalic.  Cardiovascular: Normal rate and regular rhythm.   Pulmonary/Chest: Effort normal and breath sounds normal. No respiratory distress. She has no wheezes. She has no rales.  Abdominal: Soft. There is tenderness in the periumbilical area. There is no rebound, no guarding, no tenderness at McBurney's point and negative Murphy's sign.  Morbidly obese. Pains are mild.  Neurological: She is alert and oriented to person, place, and time.  Skin: Skin is warm and dry. No rash noted.  Psychiatric: She has a normal mood and affect. Her behavior is normal.    ED Course  Procedures   DIAGNOSTIC STUDIES: Oxygen Saturation is 100% on room air.    COORDINATION OF CARE:  Nursing notes reviewed. Vital signs reviewed. Initial pt interview and examination performed.   5:28 AM-patient seen and evaluated. Patient appears well no acute distress. Does not appear in significant discomfort or pain. Discussed work up plan with pt at bedside, which includes lab testing. Pt agrees with plan.  Patient is feeling better after IV fluids and nausea medications. She has unremarkable labs testing. Repeat abdominal exam has very mild tenderness. At this time did not suspect any concerning acute abdominal process. Symptoms most likely related to gastritis or possible viral gastroenteritis. Patient is tolerating by mouth fluids. At this time she'll be discharged with  prescription for Zofran. Advised to have a recheck of symptoms in 2-3 days or return if worsened.   Treatment plan initiated: Medications  ondansetron (ZOFRAN-ODT) disintegrating tablet 8 mg (8 mg Oral Given 12/10/13 0502)    Results for orders placed during the hospital encounter of 12/10/13  URINALYSIS, ROUTINE W REFLEX MICROSCOPIC      Result  Value Range   Color, Urine AMBER (*) YELLOW   APPearance CLOUDY (*) CLEAR   Specific Gravity, Urine 1.035 (*) 1.005 - 1.030   pH 6.0  5.0 - 8.0   Glucose, UA NEGATIVE  NEGATIVE mg/dL   Hgb urine dipstick LARGE (*) NEGATIVE   Bilirubin Urine SMALL (*) NEGATIVE   Ketones, ur NEGATIVE  NEGATIVE mg/dL   Protein, ur 30 (*) NEGATIVE mg/dL   Urobilinogen, UA 1.0  0.0 - 1.0 mg/dL   Nitrite NEGATIVE  NEGATIVE   Leukocytes, UA SMALL (*) NEGATIVE  CBC WITH DIFFERENTIAL      Result Value Range   WBC 7.3  4.0 - 10.5 K/uL   RBC 4.58  3.87 - 5.11 MIL/uL   Hemoglobin 12.0  12.0 - 15.0 g/dL   HCT 16.1  09.6 - 04.5 %   MCV 79.0  78.0 - 100.0 fL   MCH 26.2  26.0 - 34.0 pg   MCHC 33.1  30.0 - 36.0 g/dL   RDW 40.9  81.1 - 91.4 %   Platelets 352  150 - 400 K/uL   Neutrophils Relative % 57  43 - 77 %   Neutro Abs 4.1  1.7 - 7.7 K/uL   Lymphocytes Relative 35  12 - 46 %   Lymphs Abs 2.6  0.7 - 4.0 K/uL   Monocytes Relative 8  3 - 12 %   Monocytes Absolute 0.6  0.1 - 1.0 K/uL   Eosinophils Relative 0  0 - 5 %   Eosinophils Absolute 0.0  0.0 - 0.7 K/uL   Basophils Relative 0  0 - 1 %   Basophils Absolute 0.0  0.0 - 0.1 K/uL  URINE MICROSCOPIC-ADD ON      Result Value Range   Squamous Epithelial / LPF MANY (*) RARE   WBC, UA 3-6  <3 WBC/hpf   RBC / HPF 21-50  <3 RBC/hpf   Bacteria, UA MANY (*) RARE   Urine-Other MUCOUS PRESENT    POCT PREGNANCY, URINE      Result Value Range   Preg Test, Ur NEGATIVE  NEGATIVE  POCT I-STAT, CHEM 8      Result Value Range   Sodium 141  137 - 147 mEq/L   Potassium 3.6 (*) 3.7 - 5.3 mEq/L   Chloride 101  96 - 112 mEq/L   BUN 15  6 - 23 mg/dL   Creatinine, Ser 7.82  0.50 - 1.10 mg/dL   Glucose, Bld 94  70 - 99 mg/dL   Calcium, Ion 9.56  2.13 - 1.23 mmol/L   TCO2 29  0 - 100 mmol/L   Hemoglobin 13.3  12.0 - 15.0 g/dL   HCT 08.6  57.8 - 46.9 %      MDM   1. Nausea & vomiting        Angus Seller, PA-C 12/10/13 2059

## 2013-12-10 NOTE — ED Notes (Signed)
Pt complains of abd pain and vomiting since yesterday morning

## 2013-12-10 NOTE — ED Notes (Signed)
Pt tolerated fluid challenge well. 

## 2013-12-11 NOTE — ED Provider Notes (Signed)
Medical screening examination/treatment/procedure(s) were performed by non-physician practitioner and as supervising physician I was immediately available for consultation/collaboration.    Tyjay Galindo, MD 12/11/13 1000 

## 2013-12-12 LAB — URINE CULTURE: Colony Count: >=100000

## 2013-12-13 ENCOUNTER — Telehealth (HOSPITAL_COMMUNITY): Payer: Self-pay | Admitting: Emergency Medicine

## 2013-12-13 NOTE — ED Notes (Addendum)
Post ED Visit - Positive Culture Follow-up  Culture report reviewed by antimicrobial stewardship pharmacist: [x]  Wes [name], Pharm.D., BCPS []  Celedonio MiyamotoJeremy Frens, Pharm.D., BCPS []  Georgina PillionElizabeth Martin, [address]Ph[address]D., BCPS []  Legend LakeMinh Pham, [address]Pharm[address]D., BCPS, AAHIVP []  Estella HuskMichelle Turner, Pharm.D., BCPS, AAHIVP  Positive urine culture Per Felicie Mornavid Smith NP, no treatment indicated and no further patient follow-up is required at this time.  Zeb ComfortHolland, Novia Lansberry 12/13/2013, 6:36 PM

## 2013-12-13 NOTE — Progress Notes (Signed)
ED Antimicrobial Stewardship Positive Culture Follow Up   Shannon Francis is an 23 y.o. female [facility] with a chief complaint of  Chief Complaint  Patient presents with  . Abdominal Pain    Recent Results (from the past 720 hour(s))  URINE CULTURE     Status: None   Collection Time    12/10/13  4:00 AM      Result Value Range Status   Specimen Description URINE, CLEAN CATCH   Final   Special Requests NONE   Final   Culture  Setup Time     Final   Value: 12/10/2013 08:56     [facility]formed at [facility]   Colony Count     Final   Value: >=100,000 COLONIES/ML     [facility] at [facility]   Culture     Final   Value: ESCHERICHIA COLI     [facility] at [facility]   Report Status 12/12/2013 FINAL   Final   Organism ID, Bacteria ESCHERICHIA COLI   Final    Positive urine culture but most likely contamination with no urinary symptoms reported - no treatment indicated at this time.   ED Provider: Felicie Mornavid Smith, NP-C   Cleon DewDulaney, [name] 12/13/2013, 7:26 PM Infectious Diseases Pharmacist Phone# [phone]

## 2014-04-21 ENCOUNTER — Emergency Department (HOSPITAL_COMMUNITY)
Admission: EM | Admit: 2014-04-21 | Discharge: 2014-04-22 | Disposition: A | Discharge status: Home / Self Care | Payer: No Typology Code available for payment source | Attending: Emergency Medicine | Admitting: Emergency Medicine

## 2014-04-21 ENCOUNTER — Encounter (HOSPITAL_COMMUNITY): Payer: Self-pay | Admitting: Emergency Medicine

## 2014-04-21 DIAGNOSIS — R630 Anorexia: Secondary | ICD-10-CM | POA: Insufficient documentation

## 2014-04-21 DIAGNOSIS — R109 Unspecified abdominal pain: Secondary | ICD-10-CM

## 2014-04-21 DIAGNOSIS — N390 Urinary tract infection, site not specified: Secondary | ICD-10-CM | POA: Insufficient documentation

## 2014-04-21 DIAGNOSIS — Z3202 Encounter for pregnancy test, result negative: Secondary | ICD-10-CM | POA: Insufficient documentation

## 2014-04-21 DIAGNOSIS — E669 Obesity, unspecified: Secondary | ICD-10-CM | POA: Insufficient documentation

## 2014-04-21 LAB — POC URINE PREG, ED: PREG TEST UR: NEGATIVE

## 2014-04-21 NOTE — ED Notes (Signed)
Pt reports LLQ pain x 2 days, denies n/v/d at this time.  Pt reports pain is worse with food.  Denies any urinary sxs at this time.

## 2014-04-22 ENCOUNTER — Emergency Department (HOSPITAL_COMMUNITY): Payer: No Typology Code available for payment source

## 2014-04-22 LAB — BASIC METABOLIC PANEL
BUN: 14 mg/dL (ref 6–23)
CHLORIDE: 102 meq/L (ref 96–112)
CO2: 29 meq/L (ref 19–32)
Calcium: 9.1 mg/dL (ref 8.4–10.5)
Creatinine, Ser: 0.97 mg/dL (ref 0.50–1.10)
GFR calc Af Amer: >90 mL/min (ref >90)
GFR calc non Af Amer: 82 mL/min — ABNORMAL LOW (ref >90)
Glucose, Bld: 103 mg/dL — ABNORMAL HIGH (ref 70–99)
Potassium: 4.2 mEq/L (ref 3.7–5.3)
SODIUM: 141 meq/L (ref 137–147)

## 2014-04-22 LAB — URINALYSIS, ROUTINE W REFLEX MICROSCOPIC
Bilirubin Urine: NEGATIVE
Glucose, UA: NEGATIVE mg/dL
Ketones, ur: NEGATIVE mg/dL
Nitrite: POSITIVE — AB
PROTEIN: NEGATIVE mg/dL
SPECIFIC GRAVITY, URINE: 1.02 (ref 1.005–1.030)
Urobilinogen, UA: 0.2 mg/dL (ref 0.0–1.0)
pH: 5.5 (ref 5.0–8.0)

## 2014-04-22 LAB — URINE MICROSCOPIC-ADD ON

## 2014-04-22 LAB — CBC WITH DIFFERENTIAL/PLATELET
Basophils Absolute: 0 10*3/uL (ref 0.0–0.1)
Basophils Relative: 0 % (ref 0–1)
EOS PCT: 1 % (ref 0–5)
Eosinophils Absolute: 0.1 10*3/uL (ref 0.0–0.7)
HEMATOCRIT: 34.7 % — AB (ref 36.0–46.0)
Hemoglobin: 11.2 g/dL — ABNORMAL LOW (ref 12.0–15.0)
LYMPHS ABS: 3.5 10*3/uL (ref 0.7–4.0)
LYMPHS PCT: 38 % (ref 12–46)
MCH: 25.1 pg — ABNORMAL LOW (ref 26.0–34.0)
MCHC: 32.3 g/dL (ref 30.0–36.0)
MCV: 77.8 fL — AB (ref 78.0–100.0)
MONO ABS: 0.6 10*3/uL (ref 0.1–1.0)
Monocytes Relative: 6 % (ref 3–12)
Neutro Abs: 5 10*3/uL (ref 1.7–7.7)
Neutrophils Relative %: 54 % (ref 43–77)
Platelets: 325 10*3/uL (ref 150–400)
RBC: 4.46 MIL/uL (ref 3.87–5.11)
RDW: 15.7 % — ABNORMAL HIGH (ref 11.5–15.5)
WBC: 9.3 10*3/uL (ref 4.0–10.5)

## 2014-04-22 MED ORDER — ACETAMINOPHEN 500 MG PO TABS
1000.0000 mg | ORAL_TABLET | Freq: Once | ORAL | Status: AC
  Administered 2014-04-22: 1000 mg via ORAL
  Filled 2014-04-22: qty 2

## 2014-04-22 MED ORDER — IBUPROFEN 800 MG PO TABS
800.0000 mg | ORAL_TABLET | Freq: Once | ORAL | Status: AC
  Administered 2014-04-22: 800 mg via ORAL
  Filled 2014-04-22: qty 1

## 2014-04-22 MED ORDER — NITROFURANTOIN MONOHYD MACRO 100 MG PO CAPS: 100.0000 mg | ORAL_CAPSULE | Freq: Two times a day (BID) | ORAL | Status: DC

## 2014-04-22 MED ORDER — NITROFURANTOIN MONOHYD MACRO 100 MG PO CAPS
100.0000 mg | ORAL_CAPSULE | Freq: Once | ORAL | Status: AC
  Administered 2014-04-22: 100 mg via ORAL
  Filled 2014-04-22: qty 1

## 2014-04-22 MED ORDER — OXYCODONE-ACETAMINOPHEN 5-325 MG PO TABS: 1.0000 | ORAL_TABLET | ORAL | Status: DC | PRN

## 2014-04-22 MED ORDER — PROMETHAZINE HCL 25 MG PO TABS: 25.0000 mg | ORAL_TABLET | Freq: Four times a day (QID) | ORAL | Status: DC | PRN

## 2014-04-22 NOTE — Discharge Instructions (Signed)
You have a urinary tract infection. Antibiotic for one week. Medication for pain and nausea. Return if worse

## 2014-04-22 NOTE — ED Provider Notes (Signed)
CSN: [id number]     Arrival date & time 04/21/14  2320 History   First MD Initiated Contact with Patient 04/21/14 2339     Chief Complaint  Patient presents with  . Abdominal Pain     (Consider location/radiation/quality/duration/timing/severity/associated sxs/prior Treatment) HPI .Shannon Francis.... left lower quadrant abdominal pain for 2 days without nausea, vomiting, diarrhea, dysuria, fever, chills. Vaginal bleeding, vaginal discharge. Decreased appetite. Normal urination and bowel movements. Last menstrual period 3 days ago. Patient is obese but claims no other health problems. Pain is minimal. No radiation.   Past Medical History  Diagnosis Date  . Obesity    Past Surgical History  Procedure Laterality Date  . Knee surgery     No family history on file. History  Substance Use Topics  . Smoking status: Never Smoker   . Smokeless tobacco: Not on file  . Alcohol Use: No   OB History   Grav Para Term Preterm Abortions TAB SAB Ect Mult Living                 Review of Systems  All other systems reviewed and are negative.     Allergies  Hydrocodone  Home Medications   Prior to Admission medications   Medication Sig Start Date End Date Taking? Authorizing Provider  nitrofurantoin, macrocrystal-monohydrate, (MACROBID) 100 MG capsule Take 1 capsule (100 mg total) by mouth 2 (two) times daily. X 7 days 04/22/14   Donnetta HutchingBrian Kenyetta Wimbish, MD  oxyCODONE-acetaminophen (PERCOCET) 5-325 MG per tablet Take 1 tablet by mouth every 4 (four) hours as needed. 04/22/14   Donnetta HutchingBrian Cheralyn Oliver, MD  promethazine (PHENERGAN) 25 MG tablet Take 1 tablet (25 mg total) by mouth every 6 (six) hours as needed for nausea. 04/22/14   Donnetta HutchingBrian Manning Luna, MD   BP 136/81  Pulse 76  Temp(Src) 98.8 F (37.1 C)  Resp 20  SpO2 100%  LMP 04/20/2014 Physical Exam  Nursing note and vitals reviewed. Constitutional: She is oriented to person, place, and time.  Obese, no acute distress  HENT:  Head: Normocephalic and atraumatic.  Eyes:  Conjunctivae and EOM are normal. Pupils are equal, round, and reactive to light.  Neck: Normal range of motion. Neck supple.  Cardiovascular: Normal rate, regular rhythm and normal heart sounds.   Pulmonary/Chest: Effort normal and breath sounds normal.  Abdominal: Soft. Bowel sounds are normal.  Musculoskeletal: Normal range of motion.  Neurological: She is alert and oriented to person, place, and time.  Skin: Skin is warm and dry.  Psychiatric: She has a normal mood and affect. Her behavior is normal.    ED Course  Procedures (including critical care time) Labs Review Labs Reviewed  URINALYSIS, ROUTINE W REFLEX MICROSCOPIC - Abnormal; Notable for the following:    APPearance CLOUDY (*)    Hgb urine dipstick TRACE (*)    Nitrite POSITIVE (*)    Leukocytes, UA SMALL (*)    All other components within normal limits  BASIC METABOLIC PANEL - Abnormal; Notable for the following:    Glucose, Bld 103 (*)    GFR calc non Af Amer 82 (*)    All other components within normal limits  CBC WITH DIFFERENTIAL - Abnormal; Notable for the following:    Hemoglobin 11.2 (*)    HCT 34.7 (*)    MCV 77.8 (*)    MCH 25.1 (*)    RDW 15.7 (*)    All other components within normal limits  URINE MICROSCOPIC-ADD ON - Abnormal; Notable for the following:  Squamous Epithelial / LPF FEW (*)    Bacteria, UA MANY (*)    All other components within normal limits  URINE CULTURE  POC URINE PREG, ED    Imaging Review Dg Abd Acute W/chest  04/22/2014   CLINICAL DATA:  Left lower quadrant pain for 24 hr.  EXAM: ACUTE ABDOMEN SERIES (ABDOMEN 2 VIEW & CHEST 1 VIEW)  COMPARISON:  None.  FINDINGS: There is no evidence of dilated bowel loops or free intraperitoneal air. No radiopaque calculi or other significant radiographic abnormality is seen. Heart size and mediastinal contours are within normal limits. Both lungs are clear.  IMPRESSION: Negative abdominal radiographs.  No acute cardiopulmonary disease.    Electronically Signed   By: Burman NievesWilliam  Stevens M.D.   On: 04/22/2014 00:36     EKG Interpretation None      MDM   Final diagnoses:  Abdominal pain  UTI (lower urinary tract infection)    No acute abdomen. Pain is superior to the left pelvis. Urinalysis shows evidence of infection. Discharge medications Macrobid, Percocet, Phenergan 25 mg    Donnetta HutchingBrian Alphonzo Devera, MD 04/22/14 [phone]

## 2014-04-24 LAB — URINE CULTURE: Special Requests: NORMAL

## 2014-04-24 NOTE — ED Notes (Signed)
Post ED Visit - Positive Culture Follow-up  Culture report reviewed by antimicrobial stewardship pharmacist: []  Wes [name], Pharm.D., BCPS []  Celedonio MiyamotoJeremy Frens, Pharm.D., BCPS []  Georgina PillionElizabeth Martin, [address]Ph[address]., BCPS []  HopeMinh Pham, [address]Ph[address]., BCPS, AAHIVP []  Estella HuskMichelle Turner, Pharm.D., BCPS, AAHIVP []  Harvie JuniorNathan Cope, Pharm.D. X   Syang Positive urine culture Treated with nitrofurantoin, organism sensitive to the same and no further patient follow-up is required at this time.  Ashley Jacobsoni C Adante Courington 04/24/2014, 2:10 PM

## 2014-04-25 ENCOUNTER — Telehealth (HOSPITAL_BASED_OUTPATIENT_CLINIC_OR_DEPARTMENT_OTHER): Payer: Self-pay | Admitting: Emergency Medicine

## 2014-04-25 NOTE — Telephone Encounter (Signed)
Post ED Visit - Positive Culture Follow-up  Culture report reviewed by antimicrobial stewardship pharmacist: []  Wes [name], Pharm.D., BCPS []  Celedonio MiyamotoJeremy Frens, Pharm.D., BCPS []  Georgina PillionElizabeth Martin, Pharm.D., BCPS []  IukaMinh Pham, [address] BoulevardPharm.D., BCPS, AAHIVP []  Estella HuskMichelle Turner, Pharm.D., BCPS, AAHIVP []  Harvie JuniorNathan Cope, Pharm.D. [x]  Lysle Pearlachel Rumbarger, Pharm.D  Positive urine culture Treated with Macrobid, organism sensitive to the same and no further patient follow-up is required at this time.  Nora Sabey 04/25/2014, 4:10 PM

## 2014-09-03 ENCOUNTER — Emergency Department (HOSPITAL_COMMUNITY)
Admission: EM | Admit: 2014-09-03 | Discharge: 2014-09-03 | Disposition: A | Discharge status: Home / Self Care | Payer: No Typology Code available for payment source | Attending: Emergency Medicine | Admitting: Emergency Medicine

## 2014-09-03 ENCOUNTER — Emergency Department (HOSPITAL_COMMUNITY): Payer: No Typology Code available for payment source

## 2014-09-03 ENCOUNTER — Encounter (HOSPITAL_COMMUNITY): Payer: Self-pay | Admitting: Emergency Medicine

## 2014-09-03 DIAGNOSIS — W1811XA Fall from or off toilet without subsequent striking against object, initial encounter: Secondary | ICD-10-CM | POA: Insufficient documentation

## 2014-09-03 DIAGNOSIS — S93402A Sprain of unspecified ligament of left ankle, initial encounter: Secondary | ICD-10-CM

## 2014-09-03 DIAGNOSIS — Z792 Long term (current) use of antibiotics: Secondary | ICD-10-CM | POA: Insufficient documentation

## 2014-09-03 DIAGNOSIS — S99919A Unspecified injury of unspecified ankle, initial encounter: Secondary | ICD-10-CM | POA: Insufficient documentation

## 2014-09-03 DIAGNOSIS — E669 Obesity, unspecified: Secondary | ICD-10-CM | POA: Insufficient documentation

## 2014-09-03 DIAGNOSIS — Y9389 Activity, other specified: Secondary | ICD-10-CM | POA: Insufficient documentation

## 2014-09-03 DIAGNOSIS — S8990XA Unspecified injury of unspecified lower leg, initial encounter: Secondary | ICD-10-CM | POA: Insufficient documentation

## 2014-09-03 DIAGNOSIS — Y9289 Other specified places as the place of occurrence of the external cause: Secondary | ICD-10-CM | POA: Insufficient documentation

## 2014-09-03 DIAGNOSIS — S93409A Sprain of unspecified ligament of unspecified ankle, initial encounter: Secondary | ICD-10-CM | POA: Insufficient documentation

## 2014-09-03 DIAGNOSIS — S93401A Sprain of unspecified ligament of right ankle, initial encounter: Secondary | ICD-10-CM

## 2014-09-03 DIAGNOSIS — S99929A Unspecified injury of unspecified foot, initial encounter: Secondary | ICD-10-CM

## 2014-09-03 DIAGNOSIS — Z9889 Other specified postprocedural states: Secondary | ICD-10-CM | POA: Insufficient documentation

## 2014-09-03 MED ORDER — OXYCODONE-ACETAMINOPHEN 5-325 MG PO TABS
2.0000 | ORAL_TABLET | Freq: Once | ORAL | Status: AC
  Administered 2014-09-03: 2 via ORAL
  Filled 2014-09-03: qty 2

## 2014-09-03 MED ORDER — OXYCODONE-ACETAMINOPHEN 5-325 MG PO TABS: 2.0000 | ORAL_TABLET | ORAL | Status: DC | PRN

## 2014-09-03 NOTE — Discharge Instructions (Signed)
Take Percocet as needed for pain. Wear ankle braces as needed for support. Rest, ice, and elevate your ankles. Refer to attached documents for more information. Follow up with Dr. Turner Daniels for further evaluation.

## 2014-09-03 NOTE — ED Notes (Signed)
C/o pain and swelling  in both ankles. Pt fell twisting both ankles on 9/19. Marland Kitchen Seen at hospital in [facility] . Stated that she was told xray's where negative

## 2014-09-03 NOTE — ED Provider Notes (Signed)
CSN: [id number]     Arrival date & time 09/03/14  1642 History  This chart was scribed for non-physician practitioner Emilia Beck, PA-C, working with Audree Camel, MD by Littie Deeds, ED Scribe. This patient was seen in room WTR9/WTR9 and the patient's care was started at 5:50 PM.    Chief Complaint  Patient presents with  . Ankle Pain    both ankle are painful after a fall      The history is provided by the patient. No language interpreter was used.   HPI Comments: Shannon Francis is a 23 y.o. female who presents to the Emergency Department complaining of constant bilateral ankle pain that began 5 days ago. She was at [facility] when she fell off a toilet because the seat was not on correctly. Patient went to the hospital in [facility] where she had XR's done, which turned out negative. She was prescribed Tylenol and Ibuprofen which did not provide relief to symptoms. She has been elevating her ankles. Patient has not followed up with an orthopedic surgeon. She states that she cannot walk due to the pain. Patient denies any other injuries.   Past Medical History  Diagnosis Date  . Obesity    Past Surgical History  Procedure Laterality Date  . Knee surgery    . Anterior cruciate ligament repair      left   Family History  Problem Relation Age of Onset  . Hypertension Mother   . Cancer Sister   . Diabetes Other    History  Substance Use Topics  . Smoking status: Never Smoker   . Smokeless tobacco: Not on file  . Alcohol Use: No   OB History   Grav Para Term Preterm Abortions TAB SAB Ect Mult Living                 Review of Systems  Musculoskeletal: Positive for arthralgias (bilateral ankles).  All other systems reviewed and are negative.     Allergies  Hydrocodone  Home Medications   Prior to Admission medications   Medication Sig Start Date End Date Taking? Authorizing Provider  nitrofurantoin, macrocrystal-monohydrate, (MACROBID) 100 MG  capsule Take 1 capsule (100 mg total) by mouth 2 (two) times daily. X 7 days 04/22/14   Donnetta Hutching, MD  oxyCODONE-acetaminophen (PERCOCET) 5-325 MG per tablet Take 1 tablet by mouth every 4 (four) hours as needed. 04/22/14   Donnetta Hutching, MD  promethazine (PHENERGAN) 25 MG tablet Take 1 tablet (25 mg total) by mouth every 6 (six) hours as needed for nausea. 04/22/14   Donnetta Hutching, MD   BP 116/63  Pulse 82  Temp(Src) 98.8 F (37.1 C) (Oral)  Resp 20  SpO2 100%  LMP 08/20/2014 Physical Exam  Nursing note and vitals reviewed. Constitutional: She is oriented to person, place, and time. She appears well-developed and well-nourished. No distress.  HENT:  Head: Normocephalic and atraumatic.  Mouth/Throat: Oropharynx is clear and moist. No oropharyngeal exudate.  Eyes: Pupils are equal, round, and reactive to light.  Neck: Neck supple.  Cardiovascular: Normal rate and intact distal pulses.   Pulmonary/Chest: Effort normal.  Musculoskeletal: She exhibits no edema.  Bilateral generalized ankle edema and TTP, no obvious deformities, pt is unable to bear weight bilateral feet  Neurological: She is alert and oriented to person, place, and time. No cranial nerve deficit.  Skin: Skin is warm and dry. No rash noted.  Psychiatric: She has a normal mood and affect. Her behavior is  normal.    ED Course  Procedures  DIAGNOSTIC STUDIES: Oxygen Saturation is 100% on RA, nml by my interpretation.    COORDINATION OF CARE: 5:55 PM-Discussed treatment plan which includes a splint, pain medication with pt at bedside and pt agreed to plan.   Labs Review Labs Reviewed - No data to display  SPLINT APPLICATION Date/Time: 7:07 PM Authorized by: Emilia Beck Consent: Verbal consent obtained. Risks and benefits: risks, benefits and alternatives were discussed Consent given by: patient Splint applied by: orthopedic technician Location details: right ankle Splint type: ASO brace Supplies used: ASO  brace Post-procedure: The splinted body part was neurovascularly unchanged following the procedure. Patient tolerance: Patient tolerated the procedure well with no immediate complications.   SPLINT APPLICATION Date/Time: 7:07 PM Authorized by: Emilia Beck Consent: Verbal consent obtained. Risks and benefits: risks, benefits and alternatives were discussed Consent given by: patient Splint applied by: orthopedic technician Location details: left ankle Splint type: ASO brace Supplies used: ASO brace Post-procedure: The splinted body part was neurovascularly unchanged following the procedure. Patient tolerance: Patient tolerated the procedure well with no immediate complications.   Imaging Review Dg Ankle Complete Left  09/03/2014   CLINICAL DATA:  Bilateral ankle pain and foot pain  EXAM: LEFT ANKLE COMPLETE - 3+ VIEW  COMPARISON:  None.  FINDINGS: There is no evidence of fracture, dislocation, or joint effusion. There is mild osteoarthritis of the dorsal talonavicular joint. Enthesopathic changes are noted at the Achilles tendon insertion. There is generalized soft tissue swelling around the left ankle.  IMPRESSION: No acute osseous injury of the left ankle. Generalized soft tissue swelling around the left ankle.   Electronically Signed   By: Elige Ko   On: 09/03/2014 17:39   Dg Ankle Complete Right  09/03/2014   CLINICAL DATA:  Status post fall 5 days ago with bilateral ankle pain  EXAM: RIGHT ANKLE - COMPLETE 3+ VIEW  COMPARISON:  None.  FINDINGS: There is no evidence of fracture, dislocation, or joint effusion. There is no evidence of arthropathy or other focal bone abnormality. There is marked soft tissue swelling medially.  IMPRESSION: No acute fracture or dislocation. Marked soft tissue swelling medial ankle.   Electronically Signed   By: Sherian Rein M.D.   On: 09/03/2014 17:39   Dg Foot Complete Left  09/03/2014   CLINICAL DATA:  Bilateral ankle and foot pain  EXAM: LEFT  FOOT - COMPLETE 3+ VIEW  COMPARISON:  None.  FINDINGS: There is no evidence of fracture or dislocation. There are enthesopathic changes of the Achilles tendon insertion. There is mild osteoarthritis of the dorsal talonavicular joint. There is generalized soft tissue swelling of the left foot.  IMPRESSION: No acute osseous injury of the left foot.   Electronically Signed   By: Elige Ko   On: 09/03/2014 17:40   Dg Foot Complete Right  09/03/2014   CLINICAL DATA:  Status post fall 5 days ago with bilateral foot pain.  EXAM: RIGHT FOOT COMPLETE - 3+ VIEW  COMPARISON:  None.  FINDINGS: There is no evidence of fracture or dislocation. There is calcification at the Achilles tendon insertion to the calcaneus. Soft tissues are unremarkable.  IMPRESSION: No acute fracture or dislocation.   Electronically Signed   By: Sherian Rein M.D.   On: 09/03/2014 17:42     EKG Interpretation None      MDM   Final diagnoses:  Ankle sprain, left, initial encounter  Ankle sprain, right, initial encounter  Patient's bilateral ankle xrays unremarkable for fractures. Patient will have bilateral ASO brace for support. Patient requesting crutches. Patient will have Orthopedic referral. No neurovascular compromise. Patient will have Percocet for pain. No other injury.   I personally performed the services described in this documentation, which was scribed in my presence. The recorded information has been reviewed and is accurate.    Emilia Beck, [place] 09/03/14 1908

## 2014-09-08 NOTE — ED Provider Notes (Signed)
Medical screening examination/treatment/procedure(s) were performed by non-physician practitioner and as supervising physician I was immediately available for consultation/collaboration.  Audree CamelScott T Muzamil Harker, MD 09/08/14 [phone]

## 2014-10-30 ENCOUNTER — Emergency Department (HOSPITAL_COMMUNITY): Payer: Self-pay

## 2014-10-30 ENCOUNTER — Emergency Department (HOSPITAL_COMMUNITY)
Admission: EM | Admit: 2014-10-30 | Discharge: 2014-10-31 | Disposition: A | Discharge status: Home / Self Care | Payer: Self-pay | Attending: Emergency Medicine | Admitting: Emergency Medicine

## 2014-10-30 ENCOUNTER — Emergency Department (HOSPITAL_COMMUNITY): Payer: No Typology Code available for payment source

## 2014-10-30 ENCOUNTER — Encounter (HOSPITAL_COMMUNITY): Payer: Self-pay | Admitting: Emergency Medicine

## 2014-10-30 DIAGNOSIS — R071 Chest pain on breathing: Secondary | ICD-10-CM | POA: Insufficient documentation

## 2014-10-30 DIAGNOSIS — R05 Cough: Secondary | ICD-10-CM

## 2014-10-30 DIAGNOSIS — B9789 Other viral agents as the cause of diseases classified elsewhere: Secondary | ICD-10-CM

## 2014-10-30 DIAGNOSIS — J069 Acute upper respiratory infection, unspecified: Secondary | ICD-10-CM | POA: Insufficient documentation

## 2014-10-30 DIAGNOSIS — R0789 Other chest pain: Secondary | ICD-10-CM

## 2014-10-30 DIAGNOSIS — Z3202 Encounter for pregnancy test, result negative: Secondary | ICD-10-CM | POA: Insufficient documentation

## 2014-10-30 DIAGNOSIS — E669 Obesity, unspecified: Secondary | ICD-10-CM | POA: Insufficient documentation

## 2014-10-30 DIAGNOSIS — R0602 Shortness of breath: Secondary | ICD-10-CM

## 2014-10-30 DIAGNOSIS — R059 Cough, unspecified: Secondary | ICD-10-CM

## 2014-10-30 LAB — CBC WITH DIFFERENTIAL/PLATELET
Basophils Absolute: 0 10*3/uL (ref 0.0–0.1)
Basophils Relative: 0 % (ref 0–1)
EOS ABS: 0.1 10*3/uL (ref 0.0–0.7)
EOS PCT: 1 % (ref 0–5)
HEMATOCRIT: 40.5 % (ref 36.0–46.0)
Hemoglobin: 13.1 g/dL (ref 12.0–15.0)
LYMPHS ABS: 2.1 10*3/uL (ref 0.7–4.0)
Lymphocytes Relative: 26 % (ref 12–46)
MCH: 24.4 pg — AB (ref 26.0–34.0)
MCHC: 32.3 g/dL (ref 30.0–36.0)
MCV: 75.6 fL — AB (ref 78.0–100.0)
MONOS PCT: 9 % (ref 3–12)
Monocytes Absolute: 0.7 10*3/uL (ref 0.1–1.0)
Neutro Abs: 5.1 10*3/uL (ref 1.7–7.7)
Neutrophils Relative %: 64 % (ref 43–77)
Platelets: 304 10*3/uL (ref 150–400)
RBC: 5.36 MIL/uL — ABNORMAL HIGH (ref 3.87–5.11)
RDW: 15.5 % (ref 11.5–15.5)
WBC: 7.9 10*3/uL (ref 4.0–10.5)

## 2014-10-30 LAB — PRO B NATRIURETIC PEPTIDE: Pro B Natriuretic peptide (BNP): 32.1 pg/mL (ref 0–125)

## 2014-10-30 LAB — BASIC METABOLIC PANEL
Anion gap: 15 (ref 5–15)
BUN: 12 mg/dL (ref 6–23)
CO2: 22 mEq/L (ref 19–32)
Calcium: 9.1 mg/dL (ref 8.4–10.5)
Chloride: 102 mEq/L (ref 96–112)
Creatinine, Ser: 0.85 mg/dL (ref 0.50–1.10)
GFR calc non Af Amer: >90 mL/min (ref >90)
GLUCOSE: 98 mg/dL (ref 70–99)
POTASSIUM: 4.5 meq/L (ref 3.7–5.3)
SODIUM: 139 meq/L (ref 137–147)

## 2014-10-30 LAB — D-DIMER, QUANTITATIVE (NOT AT ARMC): D DIMER QUANT: 0.98 ug{FEU}/mL — AB (ref 0.00–0.48)

## 2014-10-30 LAB — I-STAT TROPONIN, ED: Troponin i, poc: 0.01 ng/mL (ref 0.00–0.08)

## 2014-10-30 MED ORDER — IOHEXOL 350 MG/ML SOLN: 100.0000 mL | Freq: Once | INTRAVENOUS | Status: AC | PRN

## 2014-10-30 MED ORDER — IPRATROPIUM BROMIDE 0.02 % IN SOLN
0.5000 mg | Freq: Once | RESPIRATORY_TRACT | Status: AC
  Administered 2014-10-30: 0.5 mg via RESPIRATORY_TRACT
  Filled 2014-10-30: qty 2.5

## 2014-10-30 MED ORDER — ALBUTEROL SULFATE (2.5 MG/3ML) 0.083% IN NEBU
5.0000 mg | INHALATION_SOLUTION | Freq: Once | RESPIRATORY_TRACT | Status: AC
  Administered 2014-10-30: 5 mg via RESPIRATORY_TRACT
  Filled 2014-10-30: qty 6

## 2014-10-30 MED ORDER — OXYCODONE-ACETAMINOPHEN 5-325 MG PO TABS
2.0000 | ORAL_TABLET | Freq: Once | ORAL | Status: AC
  Administered 2014-10-30: 2 via ORAL
  Filled 2014-10-30: qty 2

## 2014-10-30 MED ORDER — PREDNISONE 20 MG PO TABS
60.0000 mg | ORAL_TABLET | Freq: Once | ORAL | Status: AC
  Administered 2014-10-30: 60 mg via ORAL
  Filled 2014-10-30: qty 3

## 2014-10-30 NOTE — ED Notes (Signed)
Pt presents with c/o URI with associated upper chest discomfort intermittent x 2 weeks.

## 2014-10-30 NOTE — ED Provider Notes (Signed)
CSN: [id number]     Arrival date & time 10/30/14  2006 History  This chart was scribed for non-physician practitioner, Antony Madura, PA-C working with Layla Maw Ward, DO by Luisa Dago, ED scribe. This patient was seen in room WTR9/WTR9 and the patient's care was started at 8:30 PM.    Chief Complaint  Patient presents with  . URI   The history is provided by the patient. No language interpreter was used.    HPI Comments: Shannon Francis is a 23 y.o. female who presents to the Emergency Department complaining of sudden onset worsening URI symptoms that started 2 week ago. Pt states that her symptoms started with a dry cough, which is still persistent. Over time the symptoms developed into a chest and nasal congestion. She states that she noticed herself wheezing and also reports a subjective fever with unknown TMAX. Current ED temperature is 98.2. Pt also endorses some chest discomfort which she describes as "stabbing" in nature. Chest discomfort is exacerbated by deep breathing. Denies any sore throat, trouble swallowing, hx of asthma, chills, emesis, nausea, or SOB.  Past Medical History  Diagnosis Date  . Obesity    Past Surgical History  Procedure Laterality Date  . Knee surgery    . Anterior cruciate ligament repair      left   Family History  Problem Relation Age of Onset  . Hypertension Mother   . Cancer Sister   . Diabetes Other    History  Substance Use Topics  . Smoking status: Never Smoker   . Smokeless tobacco: Not on file  . Alcohol Use: No   OB History    No data available      Review of Systems  HENT: Positive for congestion.   Respiratory: Positive for cough and wheezing. Negative for shortness of breath.   Gastrointestinal: Negative for nausea and vomiting.  All other systems reviewed and are negative.   Allergies  Hydrocodone  Home Medications   Prior to Admission medications   Medication Sig Start Date End Date Taking? Authorizing Provider   ibuprofen (ADVIL,MOTRIN) 200 MG tablet Take 200 mg by mouth every 6 (six) hours as needed for moderate pain.   Yes Historical Provider, MD  benzonatate (TESSALON) 100 MG capsule Take 1 capsule (100 mg total) by mouth every 8 (eight) hours. 10/31/14   Antony Madura, PA-C  nitrofurantoin, macrocrystal-monohydrate, (MACROBID) 100 MG capsule Take 1 capsule (100 mg total) by mouth 2 (two) times daily. X 7 days Patient not taking: Reported on 10/31/2014 04/22/14   Donnetta Hutching, MD  oxyCODONE-acetaminophen (PERCOCET/ROXICET) 5-325 MG per tablet Take 1-2 tablets by mouth every 6 (six) hours as needed for moderate pain or severe pain. 10/31/14   Antony Madura, PA-C  predniSONE (DELTASONE) 20 MG tablet Take 2 tablets (40 mg total) by mouth daily. 10/31/14   Antony Madura, PA-C  promethazine (PHENERGAN) 25 MG tablet Take 1 tablet (25 mg total) by mouth every 6 (six) hours as needed for nausea. Patient not taking: Reported on 10/31/2014 04/22/14   Donnetta Hutching, MD   Triage Vitals:BP 165/92 mmHg  Pulse 84  Temp(Src) 98.2 F (36.8 C) (Oral)  Resp 20  SpO2 94%  LMP 10/30/2014  Physical Exam  Constitutional: She is oriented to person, place, and time. She appears well-developed and well-nourished. No distress.  Nontoxic/nonseptic appearing  HENT:  Head: Normocephalic and atraumatic.  Audible nasal congestion  Eyes: Conjunctivae and EOM are normal.  Neck: Normal range of motion.  Cardiovascular: Normal  rate, regular rhythm and normal heart sounds.   Pulmonary/Chest: Effort normal. No respiratory distress. She has no wheezes. She has no rales.  Respirations even and unlabored. No tachypnea or accessory muscle use. Lungs clear bilaterally.  Musculoskeletal: Normal range of motion.  Neurological: She is alert and oriented to person, place, and time. She exhibits normal muscle tone. Coordination normal.  Skin: Skin is warm and dry. No rash noted. She is not diaphoretic. No erythema. No pallor.  Psychiatric: She has  a normal mood and affect. Her behavior is normal.  Nursing note and vitals reviewed.   ED Course  Procedures (including critical care time)  DIAGNOSTIC STUDIES: Oxygen Saturation is 94% on RA, normal by my interpretation.    COORDINATION OF CARE: 8:36 PM-Will order a CXR. Pt advised of plan for treatment and pt agrees.  Medications  iohexol (OMNIPAQUE) 350 MG/ML injection 100 mL (not administered)  albuterol (PROVENTIL) (2.5 MG/3ML) 0.083% nebulizer solution 5 mg (5 mg Nebulization Given 10/30/14 2057)  ipratropium (ATROVENT) nebulizer solution 0.5 mg (0.5 mg Nebulization Given 10/30/14 2057)  predniSONE (DELTASONE) tablet 60 mg (60 mg Oral Given 10/30/14 2058)  oxyCODONE-acetaminophen (PERCOCET/ROXICET) 5-325 MG per tablet 2 tablet (2 tablets Oral Given 10/30/14 2057)  iohexol (OMNIPAQUE) 350 MG/ML injection 100 mL (100 mLs Intravenous Contrast Given 10/31/14 0157)  albuterol (PROVENTIL HFA;VENTOLIN HFA) 108 (90 BASE) MCG/ACT inhaler 2 puff (2 puffs Inhalation Given 10/31/14 0311)   Labs Review Labs Reviewed  CBC WITH DIFFERENTIAL - Abnormal; Notable for the following:    RBC 5.36 (*)    MCV 75.6 (*)    MCH 24.4 (*)    All other components within normal limits  D-DIMER, QUANTITATIVE - Abnormal; Notable for the following:    D-Dimer, Quant 0.98 (*)    All other components within normal limits  PRO B NATRIURETIC PEPTIDE  BASIC METABOLIC PANEL  I-STAT TROPOININ, ED  POC URINE PREG, ED    Imaging Review Dg Chest 2 View  10/30/2014   CLINICAL DATA:  Initial evaluation for left-sided chest pain with cough, shortness of breath.  EXAM: CHEST  2 VIEW  COMPARISON:  Prior radiograph from 04/22/2014.  FINDINGS: There is mild cardiomegaly, stable from prior. Mediastinal silhouette within normal limits.  Lungs are normally inflated. There is mild central perihilar vascular congestion without overt pulmonary edema. No focal infiltrates identified. No focal infiltrates identified. No  pleural effusion or pneumothorax.  No acute osseous abnormality  IMPRESSION: Mild cardiomegaly with central perihilar vascular congestion without overt pulmonary edema. No focal infiltrates identified.   Electronically Signed   By: Rise MuBenjamin  McClintock M.D.   On: 10/30/2014 20:51   Ct Angio Chest Pe W/cm &/or Wo Cm  10/31/2014   CLINICAL DATA:  Upper respiratory infection symptoms for 2 weeks with chest and nasal congestion and discomfort with deep inspiration. Question pulmonary embolism. Initial encounter.  EXAM: CT ANGIOGRAPHY CHEST WITH CONTRAST  TECHNIQUE: Multidetector CT imaging of the chest was performed using the standard protocol during bolus administration of intravenous contrast. Multiplanar CT image reconstructions and MIPs were obtained to evaluate the vascular anatomy.  CONTRAST:  100mL OMNIPAQUE IOHEXOL 350 MG/ML SOLN  COMPARISON:  Chest radiographs 04/22/2014 and 10/30/2014.  FINDINGS: Vascular: The pulmonary arteries are marginally opacified with contrast. No central pulmonary emboli are demonstrated. Evaluation the pulmonary arteries beyond the segmental levels is suboptimal. No significant atherosclerosis is identified.  Mediastinum: There is extensive mediastinal and hilar adenopathy bilaterally. Representative nodes include a 3.2 x 2.4 cm AP window  node on image 25, a 2.8 x 3.3 cm subcarinal node on image 39 and a 4.5 x 2.7 cm right hilar node on image 45. The thyroid gland, trachea and esophagus demonstrate no significant findings.  Lungs/Pleura: There is no pleural or pericardial effusion.There is some central airway thickening on the left with infrahilar lower lobe atelectasis. There is minimal right lower lobe atelectasis. No consolidation or focal endobronchial lesion demonstrated.  Upper abdomen: Unremarkable.  There is no adrenal mass.  Musculoskeletal/Chest wall: No chest wall lesion or acute osseous findings.  Review of the MIP images confirms the above findings.  IMPRESSION: 1. No  evidence of acute pulmonary embolism. Contrast bolus is suboptimal. If there is persistent concern of thromboembolic disease, correlation with lower extremity Doppler ultrasound suggested. 2. Extensive mediastinal and hilar adenopathy bilaterally with associated central airway thickening and left lower lobe infrahilar atelectasis. Primary differential considerations include sarcoidosis and lymphoproliferative process. Clinical evaluation and possible tissue sampling recommended as clinically warranted.   Electronically Signed   By: Roxy HorsemanBill  Veazey M.D.   On: 10/31/2014 02:26    MDM   Final diagnoses:  Shortness of breath  Viral URI with cough  Costochondral chest pain    23 year old female presents to the emergency department for further evaluation of upper respiratory symptoms. She states that symptoms began 2 weeks ago and have been worsening since onset. Symptoms include cough as well as the development of chest discomfort which is pleuritic in nature. Suspect costochondral chest pain. Chest x-ray ordered to evaluate for pneumonia which was negative for infiltrate, but significant for cardiomegaly and vascular congestion. Patient denies any history of CHF or any cardiac problems or hypertension. Further workup was pursued at this time.   Labs revealed no leukocytosis, anemia, or electrolyte imbalance. Cardiac workup was unremarkable and BNP was normal. D-dimer was elevated to 0.98. For this, CT scan was performed to evaluate for PE. CT scan showed no evidence of pulmonary embolism, though radiologist did remark that the study was suboptimal. CT scan did also show extensive mediastinal and hilar adenopathy bilaterally with central airway thickening and left lower lobe infrahilar atelectasis. Given these findings, radiologist commented on possibility of sarcoidosis or lymphoproliferative process.  Labs and imaging findings reviewed with patient extensively. I answered all questions that the patient had  regarding these findings and advised outpatient pulmonology follow-up for further evaluation. Over ED course, patient has received Percocet for pain control as well as albuterol nebulizer treatment. She has had some improvement in her symptoms with this regimen. I do suspect that symptoms are secondary to an upper respiratory infection given her acute and persistent nature. We'll manage these symptoms as an outpatient with an albuterol inhaler, 5 day course of prednisone, Tessalon, and Percocet. Return precautions discussed and provided. Patient agreeable to plan with known address concerns. Patient discharged in good condition; VSS.  I personally performed the services described in this documentation, which was scribed in my presence. The recorded information has been reviewed and is accurate.   Filed Vitals:   10/30/14 2010 10/30/14 2209 10/31/14 0311  BP: 165/92 130/93 128/88  Pulse: 84 98 102  Temp: 98.2 F (36.8 C) 98.2 F (36.8 C) 98 F (36.7 C)  TempSrc: Oral Oral Oral  Resp: 20 21 20   SpO2: 94% 100% 94%     Antony MaduraKelly Nisha Dhami, PA-C 10/31/14 0328  Layla MawKristen N Ward, DO 11/05/14 1937

## 2014-10-31 LAB — POC URINE PREG, ED: PREG TEST UR: NEGATIVE

## 2014-10-31 MED ORDER — OXYCODONE-ACETAMINOPHEN 5-325 MG PO TABS: 1.0000 | ORAL_TABLET | Freq: Four times a day (QID) | ORAL | Status: DC | PRN

## 2014-10-31 MED ORDER — BENZONATATE 100 MG PO CAPS: 100.0000 mg | ORAL_CAPSULE | Freq: Three times a day (TID) | ORAL | Status: DC

## 2014-10-31 MED ORDER — PREDNISONE 20 MG PO TABS: 40.0000 mg | ORAL_TABLET | Freq: Every day | ORAL | Status: DC

## 2014-10-31 MED ORDER — IOHEXOL 350 MG/ML SOLN
100.0000 mL | Freq: Once | INTRAVENOUS | Status: AC | PRN
  Administered 2014-10-31: 100 mL via INTRAVENOUS

## 2014-10-31 MED ORDER — ALBUTEROL SULFATE HFA 108 (90 BASE) MCG/ACT IN AERS
2.0000 | INHALATION_SPRAY | Freq: Once | RESPIRATORY_TRACT | Status: AC
  Administered 2014-10-31: 2 via RESPIRATORY_TRACT
  Filled 2014-10-31: qty 6.7

## 2014-10-31 NOTE — Discharge Instructions (Signed)
Upper Respiratory Infection, Adult °An upper respiratory infection (URI) is also sometimes known as the common cold. The upper respiratory tract includes the nose, sinuses, throat, trachea, and bronchi. Bronchi are the airways leading to the lungs. Most people improve within 1 week, but symptoms can last up to 2 weeks. A residual cough may last even longer.  °CAUSES °Many different viruses can infect the tissues lining the upper respiratory tract. The tissues become irritated and inflamed and often become very moist. Mucus production is also common. A cold is contagious. You can easily spread the virus to others by oral contact. This includes kissing, sharing a glass, coughing, or sneezing. Touching your mouth or nose and then touching a surface, which is then touched by another person, can also spread the virus. °SYMPTOMS  °Symptoms typically develop 1 to 3 days after you come in contact with a cold virus. Symptoms vary from person to person. They may include: °· Runny nose. °· Sneezing. °· Nasal congestion. °· Sinus irritation. °· Sore throat. °· Loss of voice (laryngitis). °· Cough. °· Fatigue. °· Muscle aches. °· Loss of appetite. °· Headache. °· Low-grade fever. °DIAGNOSIS  °You might diagnose your own cold based on familiar symptoms, since most people get a cold 2 to 3 times a year. Your caregiver can confirm this based on your exam. Most importantly, your caregiver can check that your symptoms are not due to another disease such as strep throat, sinusitis, pneumonia, asthma, or epiglottitis. Blood tests, throat tests, and X-rays are not necessary to diagnose a common cold, but they may sometimes be helpful in excluding other more serious diseases. Your caregiver will decide if any further tests are required. °RISKS AND COMPLICATIONS  °You may be at risk for a more severe case of the common cold if you smoke cigarettes, have chronic heart disease (such as heart failure) or lung disease (such as asthma), or if  you have a weakened immune system. The very young and very old are also at risk for more serious infections. Bacterial sinusitis, middle ear infections, and bacterial pneumonia can complicate the common cold. The common cold can worsen asthma and chronic obstructive pulmonary disease (COPD). Sometimes, these complications can require emergency medical care and may be life-threatening. °PREVENTION  °The best way to protect against getting a cold is to practice good hygiene. Avoid oral or hand contact with people with cold symptoms. Wash your hands often if contact occurs. There is no clear evidence that vitamin C, vitamin E, echinacea, or exercise reduces the chance of developing a cold. However, it is always recommended to get plenty of rest and practice good nutrition. °TREATMENT  °Treatment is directed at relieving symptoms. There is no cure. Antibiotics are not effective, because the infection is caused by a virus, not by bacteria. Treatment may include: °· Increased fluid intake. Sports drinks offer valuable electrolytes, sugars, and fluids. °· Breathing heated mist or steam (vaporizer or shower). °· Eating chicken soup or other clear broths, and maintaining good nutrition. °· Getting plenty of rest. °· Using gargles or lozenges for comfort. °· Controlling fevers with ibuprofen or acetaminophen as directed by your caregiver. °· Increasing usage of your inhaler if you have asthma. °Zinc gel and zinc lozenges, taken in the first 24 hours of the common cold, can shorten the duration and lessen the severity of symptoms. Pain medicines may help with fever, muscle aches, and throat pain. A variety of non-prescription medicines are available to treat congestion and runny nose. Your caregiver   can make recommendations and may suggest nasal or lung inhalers for other symptoms.  HOME CARE INSTRUCTIONS   Only take over-the-counter or prescription medicines for pain, discomfort, or fever as directed by your  caregiver.  Use a warm mist humidifier or inhale steam from a shower to increase air moisture. This may keep secretions moist and make it easier to breathe.  Drink enough water and fluids to keep your urine clear or pale yellow.  Rest as needed.  Return to work when your temperature has returned to normal or as your caregiver advises. You may need to stay home longer to avoid infecting others. You can also use a face mask and careful hand washing to prevent spread of the virus. SEEK MEDICAL CARE IF:   After the first few days, you feel you are getting worse rather than better.  You need your caregiver's advice about medicines to control symptoms.  You develop chills, worsening shortness of breath, or brown or red sputum. These may be signs of pneumonia.  You develop yellow or brown nasal discharge or pain in the face, especially when you bend forward. These may be signs of sinusitis.  You develop a fever, swollen neck glands, pain with swallowing, or white areas in the back of your throat. These may be signs of strep throat. SEEK IMMEDIATE MEDICAL CARE IF:   You have a fever.  You develop severe or persistent headache, ear pain, sinus pain, or chest pain.  You develop wheezing, a prolonged cough, cough up blood, or have a change in your usual mucus (if you have chronic lung disease).  You develop sore muscles or a stiff neck. Document Released: 05/23/2001 Document Revised: 02/19/2012 Document Reviewed: 03/04/2014 [facility] Patient Information 2015 [employer] [place], [place]. This information is not intended to replace advice given to you by your health care provider. Make sure you discuss any questions you have with your health care provider. Costochondritis Costochondritis, sometimes called Tietze syndrome, is a swelling and irritation (inflammation) of the tissue (cartilage) that connects your ribs with your breastbone (sternum). It causes pain in the chest and rib area. Costochondritis usually  goes away on its own over time. It can take up to 6 weeks or longer to get better, especially if you are unable to limit your activities. CAUSES  Some cases of costochondritis have no known cause. Possible causes include:  Injury (trauma).  Exercise or activity such as lifting.  Severe coughing. SIGNS AND SYMPTOMS  Pain and tenderness in the chest and rib area.  Pain that gets worse when coughing or taking deep breaths.  Pain that gets worse with specific movements. DIAGNOSIS  Your health care provider will do a physical exam and ask about your symptoms. Chest X-rays or other tests may be done to rule out other problems. TREATMENT  Costochondritis usually goes away on its own over time. Your health care provider may prescribe medicine to help relieve pain. HOME CARE INSTRUCTIONS   Avoid exhausting physical activity. Try not to strain your ribs during normal activity. This would include any activities using chest, abdominal, and side muscles, especially if heavy weights are used.  Apply ice to the affected area for the first 2 days after the pain begins.  Put ice in a plastic bag.  Place a towel between your skin and the bag.  Leave the ice on for 20 minutes, 2-3 times a day.  Only take over-the-counter or prescription medicines as directed by your health care provider. SEEK MEDICAL CARE IF:  You have redness or swelling at the rib joints. These are signs of infection.  Your pain does not go away despite rest or medicine. SEEK IMMEDIATE MEDICAL CARE IF:   Your pain increases or you are very uncomfortable.  You have shortness of breath or difficulty breathing.  You cough up blood.  You have worse chest pains, sweating, or vomiting.  You have a fever or persistent symptoms for more than 2-3 days.  You have a fever and your symptoms suddenly get worse. MAKE SURE YOU:   Understand these instructions.  Will watch your condition.  Will get help right away if you are  not doing well or get worse. Document Released: 09/06/2005 Document Revised: 09/17/2013 Document Reviewed: 07/01/2013 [facility] Patient Information 2015 [place], [place]. This information is not intended to replace advice given to you by your health care provider. Make sure you discuss any questions you have with your health care provider. Cough, Adult  A cough is a reflex that helps clear your throat and airways. It can help heal the body or may be a reaction to an irritated airway. A cough may only last 2 or 3 weeks (acute) or may last more than 8 weeks (chronic).  CAUSES Acute cough:  Viral or bacterial infections. Chronic cough:  Infections.  Allergies.  Asthma.  Post-nasal drip.  Smoking.  Heartburn or acid reflux.  Some medicines.  Chronic lung problems (COPD).  Cancer. SYMPTOMS   Cough.  Fever.  Chest pain.  Increased breathing rate.  High-pitched whistling sound when breathing (wheezing).  Colored mucus that you cough up (sputum). TREATMENT   A bacterial cough may be treated with antibiotic medicine.  A viral cough must run its course and will not respond to antibiotics.  Your caregiver may recommend other treatments if you have a chronic cough. HOME CARE INSTRUCTIONS   Only take over-the-counter or prescription medicines for pain, discomfort, or fever as directed by your caregiver. Use cough suppressants only as directed by your caregiver.  Use a cold steam vaporizer or humidifier in your bedroom or home to help loosen secretions.  Sleep in a semi-upright position if your cough is worse at night.  Rest as needed.  Stop smoking if you smoke. SEEK IMMEDIATE MEDICAL CARE IF:   You have pus in your sputum.  Your cough starts to worsen.  You cannot control your cough with suppressants and are losing sleep.  You begin coughing up blood.  You have difficulty breathing.  You develop pain which is getting worse or is uncontrolled with  medicine.  You have a fever. MAKE SURE YOU:   Understand these instructions.  Will watch your condition.  Will get help right away if you are not doing well or get worse. Document Released: 05/26/2011 Document Revised: 02/19/2012 Document Reviewed: 05/26/2011 [facility] Patient Information 2015 [place], [place]. This information is not intended to replace advice given to you by your health care provider. Make sure you discuss any questions you have with your health care provider.

## 2014-10-31 NOTE — ED Notes (Signed)
Patient transported to CT 

## 2014-10-31 NOTE — ED Notes (Signed)
Continuing to await IV team.

## 2014-12-19 ENCOUNTER — Emergency Department (HOSPITAL_COMMUNITY)
Admission: EM | Admit: 2014-12-19 | Discharge: 2014-12-19 | Disposition: A | Discharge status: Home / Self Care | Payer: No Typology Code available for payment source | Attending: Emergency Medicine | Admitting: Emergency Medicine

## 2014-12-19 ENCOUNTER — Encounter (HOSPITAL_COMMUNITY): Payer: Self-pay | Admitting: Emergency Medicine

## 2014-12-19 ENCOUNTER — Emergency Department (HOSPITAL_COMMUNITY): Payer: No Typology Code available for payment source

## 2014-12-19 DIAGNOSIS — H73892 Other specified disorders of tympanic membrane, left ear: Secondary | ICD-10-CM | POA: Insufficient documentation

## 2014-12-19 DIAGNOSIS — R51 Headache: Secondary | ICD-10-CM | POA: Insufficient documentation

## 2014-12-19 DIAGNOSIS — Z3202 Encounter for pregnancy test, result negative: Secondary | ICD-10-CM | POA: Insufficient documentation

## 2014-12-19 DIAGNOSIS — R05 Cough: Secondary | ICD-10-CM

## 2014-12-19 DIAGNOSIS — E669 Obesity, unspecified: Secondary | ICD-10-CM | POA: Insufficient documentation

## 2014-12-19 DIAGNOSIS — R059 Cough, unspecified: Secondary | ICD-10-CM

## 2014-12-19 DIAGNOSIS — J159 Unspecified bacterial pneumonia: Secondary | ICD-10-CM | POA: Insufficient documentation

## 2014-12-19 DIAGNOSIS — R509 Fever, unspecified: Secondary | ICD-10-CM

## 2014-12-19 DIAGNOSIS — Z7952 Long term (current) use of systemic steroids: Secondary | ICD-10-CM | POA: Insufficient documentation

## 2014-12-19 DIAGNOSIS — R000 Tachycardia, unspecified: Secondary | ICD-10-CM | POA: Insufficient documentation

## 2014-12-19 DIAGNOSIS — J189 Pneumonia, unspecified organism: Secondary | ICD-10-CM

## 2014-12-19 DIAGNOSIS — Z79899 Other long term (current) drug therapy: Secondary | ICD-10-CM | POA: Insufficient documentation

## 2014-12-19 LAB — POC URINE PREG, ED: Preg Test, Ur: NEGATIVE

## 2014-12-19 MED ORDER — LEVOFLOXACIN 750 MG PO TABS: 750.0000 mg | ORAL_TABLET | Freq: Every day | ORAL | Status: DC

## 2014-12-19 MED ORDER — HYDROCODONE-HOMATROPINE 5-1.5 MG/5ML PO SYRP: 5.0000 mL | ORAL_SOLUTION | Freq: Four times a day (QID) | ORAL | Status: DC | PRN

## 2014-12-19 MED ORDER — OXYCODONE-ACETAMINOPHEN 5-325 MG PO TABS
2.0000 | ORAL_TABLET | Freq: Once | ORAL | Status: AC
  Administered 2014-12-19: 2 via ORAL
  Filled 2014-12-19: qty 2

## 2014-12-19 NOTE — Discharge Instructions (Signed)
Take Levaquin as directed until gone. Take hycodan as needed for cough. Refer to attached documents for more information. Return to the ED with worsening or concerning symptoms.  °

## 2014-12-19 NOTE — ED Notes (Signed)
Pt from home c/o bodyaches, fever,and congestion since November. She reports taking OTC meds without relief.

## 2014-12-19 NOTE — ED Provider Notes (Signed)
CSN: [id number]     Arrival date & time 12/19/14  1191 History  This chart was scribed for non-physician practitioner Emilia Beck, PA-C working with Gerhard Munch, MD, by Modena Jansky, ED Scribe. This patient was seen in room WTR6/WTR6 and the patient's care was started at 8:23 PM.  Chief Complaint  Patient presents with  . Generalized Body Aches  . Fever   Patient is a 24 y.o. female presenting with fever. The history is provided by the patient. No language interpreter was used.  Fever Temp source:  Subjective Severity:  Moderate Timing:  Intermittent Progression:  Unchanged Chronicity:  New Relieved by:  Nothing Worsened by:  Nothing tried Ineffective treatments: OTC medications. Associated symptoms: congestion, cough, ear pain, headaches, myalgias (generalized) and vomiting   Risk factors: no sick contacts    HPI Comments: Shannon Francis is a 24 y.o. female who presents to the Emergency Department complaining of a moderate intermittent subjective fever that has been going on for about 2 months ago. She states that she has been sick with constant moderate ear pain, generalized body aches, congestion, vomiting, cough, and headache. She states that she was She reports that OTC medications provided no relief. She denies any sick contacts.   Past Medical History  Diagnosis Date  . Obesity    Past Surgical History  Procedure Laterality Date  . Knee surgery    . Anterior cruciate ligament repair      left   Family History  Problem Relation Age of Onset  . Hypertension Mother   . Cancer Sister   . Diabetes Other    History  Substance Use Topics  . Smoking status: Never Smoker   . Smokeless tobacco: Not on file  . Alcohol Use: No   OB History    No data available     Review of Systems  Constitutional: Positive for fever.  HENT: Positive for congestion and ear pain.   Respiratory: Positive for cough.   Gastrointestinal: Positive for vomiting.   Musculoskeletal: Positive for myalgias (generalized).  Neurological: Positive for headaches.  All other systems reviewed and are negative.  Allergies  Hydrocodone  Home Medications   Prior to Admission medications   Medication Sig Start Date End Date Taking? Authorizing Provider  benzonatate (TESSALON) 100 MG capsule Take 1 capsule (100 mg total) by mouth every 8 (eight) hours. 10/31/14   Antony Madura, PA-C  ibuprofen (ADVIL,MOTRIN) 200 MG tablet Take 200 mg by mouth every 6 (six) hours as needed for moderate pain.    Historical Provider, MD  nitrofurantoin, macrocrystal-monohydrate, (MACROBID) 100 MG capsule Take 1 capsule (100 mg total) by mouth 2 (two) times daily. X 7 days Patient not taking: Reported on 10/31/2014 04/22/14   Donnetta Hutching, MD  oxyCODONE-acetaminophen (PERCOCET/ROXICET) 5-325 MG per tablet Take 1-2 tablets by mouth every 6 (six) hours as needed for moderate pain or severe pain. 10/31/14   Antony Madura, PA-C  predniSONE (DELTASONE) 20 MG tablet Take 2 tablets (40 mg total) by mouth daily. 10/31/14   Antony Madura, PA-C  promethazine (PHENERGAN) 25 MG tablet Take 1 tablet (25 mg total) by mouth every 6 (six) hours as needed for nausea. Patient not taking: Reported on 10/31/2014 04/22/14   Donnetta Hutching, MD   BP 149/87 mmHg  Pulse 115  Temp(Src) 99.6 F (37.6 C) (Oral)  Resp 20  SpO2 95%  LMP 10/30/2014 Physical Exam  Constitutional: She is oriented to person, place, and time. She appears well-developed and well-nourished. No distress.  HENT:  Head: Normocephalic and atraumatic.  Mouth/Throat: Oropharynx is clear and moist. No oropharyngeal exudate.  Left external ear canal and TM was erythematous. No TM bulging. Bilateral TM intact.   Eyes: Conjunctivae and EOM are normal. Pupils are equal, round, and reactive to light.  Neck: Neck supple. No tracheal deviation present.  Cardiovascular: Regular rhythm.   Tachycardic.   Pulmonary/Chest: Effort normal. No respiratory  distress.  Rhonchi noted in the left lower lobe. Pt coughing throughout exam.   Abdominal: Soft.  Musculoskeletal: Normal range of motion.  Neurological: She is alert and oriented to person, place, and time.  Skin: Skin is warm and dry.  Psychiatric: She has a normal mood and affect. Her behavior is normal.  Nursing note and vitals reviewed.   ED Course  Procedures (including critical care time) DIAGNOSTIC STUDIES: Oxygen Saturation is 95% on RA, normal by my interpretation.    COORDINATION OF CARE: 8:27 PM- Pt advised of plan for treatment which includes radiology and pt agrees.  Labs Review Labs Reviewed - No data to display  Imaging Review Dg Chest 2 View  12/19/2014   CLINICAL DATA:  Body aches, fever and cough, chest pain beginning in November 2015  EXAM: CHEST  2 VIEW  COMPARISON:  Chest radiograph October 30, 2014  FINDINGS: The cardiac silhouette appears mildly enlarged, unchanged. Mediastinal silhouette is unremarkable. Pulmonary vascular congestion without pleural effusion or focal consolidation. Bronchitic changes. Fullness of the RIGHT pulmonary hilum. No pneumothorax.  Large body habitus.  Osseous structures are nonsuspicious.  IMPRESSION: Mild cardiomegaly and pulmonary vascular congestion.  Fullness of the RIGHT pulmonary hilum corresponding to known lymphadenopathy.   Electronically Signed   By: Awilda Metroourtnay  Bloomer   On: 12/19/2014 21:36     EKG Interpretation None      MDM   Final diagnoses:  Cough  Fever  CAP (community acquired pneumonia)    9:50 PM Patient's chest xray shows lymphadenopathy. I will treat patient for CAP based on clinical appearance and focal lung finding on exam. Patient will have Levaquin and hycodan. Patient instructed to return with worsening or concerning symptoms.    I personally performed the services described in this documentation, which was scribed in my presence. The recorded information has been reviewed and is  accurate.     Emilia BeckKaitlyn Brittanni Cariker, PA-C 12/19/14 2151  Gerhard Munchobert Lockwood, MD 12/19/14 2245

## 2015-12-10 ENCOUNTER — Encounter (HOSPITAL_COMMUNITY): Payer: Self-pay | Admitting: Emergency Medicine

## 2015-12-10 ENCOUNTER — Emergency Department (HOSPITAL_COMMUNITY): Payer: No Typology Code available for payment source

## 2015-12-10 ENCOUNTER — Emergency Department (HOSPITAL_COMMUNITY)
Admission: EM | Admit: 2015-12-10 | Discharge: 2015-12-10 | Disposition: A | Discharge status: Home / Self Care | Payer: No Typology Code available for payment source | Attending: Emergency Medicine | Admitting: Emergency Medicine

## 2015-12-10 DIAGNOSIS — Y9289 Other specified places as the place of occurrence of the external cause: Secondary | ICD-10-CM | POA: Insufficient documentation

## 2015-12-10 DIAGNOSIS — Z9889 Other specified postprocedural states: Secondary | ICD-10-CM | POA: Insufficient documentation

## 2015-12-10 DIAGNOSIS — E669 Obesity, unspecified: Secondary | ICD-10-CM | POA: Insufficient documentation

## 2015-12-10 DIAGNOSIS — W01198A Fall on same level from slipping, tripping and stumbling with subsequent striking against other object, initial encounter: Secondary | ICD-10-CM | POA: Insufficient documentation

## 2015-12-10 DIAGNOSIS — S060X0A Concussion without loss of consciousness, initial encounter: Secondary | ICD-10-CM | POA: Insufficient documentation

## 2015-12-10 DIAGNOSIS — S8992XA Unspecified injury of left lower leg, initial encounter: Secondary | ICD-10-CM | POA: Insufficient documentation

## 2015-12-10 DIAGNOSIS — Y998 Other external cause status: Secondary | ICD-10-CM | POA: Insufficient documentation

## 2015-12-10 DIAGNOSIS — Y9389 Activity, other specified: Secondary | ICD-10-CM | POA: Insufficient documentation

## 2015-12-10 DIAGNOSIS — M25562 Pain in left knee: Secondary | ICD-10-CM

## 2015-12-10 MED ORDER — IBUPROFEN 800 MG PO TABS: 800.0000 mg | ORAL_TABLET | Freq: Three times a day (TID) | ORAL | Status: DC | PRN

## 2015-12-10 MED ORDER — OXYCODONE-ACETAMINOPHEN 5-325 MG PO TABS
1.0000 | ORAL_TABLET | Freq: Once | ORAL | Status: AC
  Administered 2015-12-10: 1 via ORAL
  Filled 2015-12-10: qty 1

## 2015-12-10 MED ORDER — TRAMADOL HCL 50 MG PO TABS: 50.0000 mg | ORAL_TABLET | Freq: Four times a day (QID) | ORAL | Status: DC | PRN

## 2015-12-10 MED ORDER — KETOROLAC TROMETHAMINE 60 MG/2ML IM SOLN
60.0000 mg | Freq: Once | INTRAMUSCULAR | Status: AC
  Administered 2015-12-10: 60 mg via INTRAMUSCULAR
  Filled 2015-12-10: qty 2

## 2015-12-10 NOTE — Discharge Instructions (Signed)
Return here as needed.  Follow-up with the orthopedist provided.  Your CT scan and x-rays did not show any abnormalities

## 2015-12-10 NOTE — ED Notes (Signed)
Pt c/o left knee pain and headache and pressure in left eye after fall yesterday in which she landed on posterior head and left knee, which has had multiple surgeries in the past. PERL, red reflex intact.

## 2015-12-10 NOTE — ED Provider Notes (Signed)
CSN: [id number]     Arrival date & time 12/10/15  1122 History   First MD Initiated Contact with Patient 12/10/15 1334     Chief Complaint  Patient presents with  . Fall     (Consider location/radiation/quality/duration/timing/severity/associated sxs/prior Treatment) HPI Patient presents to the emergency department with left knee pain and posterior headache with pressure behind her eyes from a fall that occurred yesterday.  The patient states she tripped, fell, landing on the back of her head and hitting her left knee.  Patient, states she has had surgery on her left knee in the past, states she did not lose consciousness.  She has no chest pain, shortness of breath, nausea, vomiting, weakness, dizziness, blurred vision, back pain, neck pain, near syncope or syncope.  The patient states that she did not take any medications prior to arrival.  She states nothing seems make her condition better or worse Past Medical History  Diagnosis Date  . Obesity    Past Surgical History  Procedure Laterality Date  . Knee surgery    . Anterior cruciate ligament repair      left   Family History  Problem Relation Age of Onset  . Hypertension Mother   . Cancer Sister   . Diabetes Other    Social History  Substance Use Topics  . Smoking status: Never Smoker   . Smokeless tobacco: None  . Alcohol Use: No   OB History    No data available     Review of Systems  All other systems negative except as documented in the HPI. All pertinent positives and negatives as reviewed in the HPI.  Allergies  Hydrocodone  Home Medications   Prior to Admission medications   Not on File   BP 138/89 mmHg  Pulse 72  Temp(Src) 98.4 F (36.9 C) (Oral)  Resp 16  SpO2 100%  LMP 12/07/2015 Physical Exam  Constitutional: She is oriented to person, place, and time. She appears well-developed and well-nourished. No distress.  HENT:  Head: Normocephalic and atraumatic.  Mouth/Throat: Oropharynx is clear  and moist.  Eyes: Pupils are equal, round, and reactive to light.  Neck: Normal range of motion. Neck supple.  Cardiovascular: Normal rate, regular rhythm and normal heart sounds.  Exam reveals no gallop and no friction rub.   No murmur heard. Pulmonary/Chest: Effort normal and breath sounds normal. No respiratory distress. She has no wheezes.  Musculoskeletal:       Left knee: She exhibits decreased range of motion. She exhibits no swelling, no effusion, no ecchymosis and no deformity. Tenderness found. Medial joint line and lateral joint line tenderness noted.  Neurological: She is alert and oriented to person, place, and time. She exhibits normal muscle tone. Coordination normal.  Skin: Skin is warm and dry. No rash noted. No erythema.  Psychiatric: She has a normal mood and affect. Her behavior is normal.  Nursing note and vitals reviewed.   ED Course  Procedures (including critical care time) Labs Review Labs Reviewed - No data to display  Imaging Review Ct Head Wo Contrast  12/10/2015  CLINICAL DATA:  Headache and pressure sensation in the left eye since the patient fell and struck her head yesterday. EXAM: CT HEAD WITHOUT CONTRAST TECHNIQUE: Contiguous axial images were obtained from the base of the skull through the vertex without intravenous contrast. COMPARISON:  None. FINDINGS: No mass lesion. No midline shift. No acute hemorrhage or hematoma. No extra-axial fluid collections. No evidence of acute infarction. Brain parenchyma  is normal. Osseous structures are normal. IMPRESSION: Normal exam. Electronically Signed   By: Francene Boyers M.D.   On: 12/10/2015 14:43   Dg Knee Complete 4 Views Left  12/10/2015  CLINICAL DATA:  Fall last night with left knee pain, initial encounter EXAM: LEFT KNEE - COMPLETE 4+ VIEW COMPARISON:  11/01/2011 FINDINGS: There are changes consistent with prior ACL repair. Degenerative changes are noted with osteophytic change medially and laterally. No acute  fracture or dislocation is noted. No joint effusion is seen. IMPRESSION: Postsurgical changes.  Mild degenerative changes are again noted. Electronically Signed   By: Alcide Clever M.D.   On: 12/10/2015 14:48   I have personally reviewed and evaluated these images and lab results as part of my medical decision-making.   EKG Interpretation None      Patient will be treated for her left knee discomfort.  Advised follow-up with orthopedics.  Told to return here as needed.  Ice and elevate her knee.  The patient does not have any significant findings noted on head CT.  I think she does have a concussion and that is causing her discomfort.  She does not have any neurological deficits noted on exam    Charlestine Night, PA-C 12/10/15 1457  Raeford Razor, MD 12/11/15 [phone]

## 2015-12-10 NOTE — ED Notes (Signed)
PT DISCHARGED. INSTRUCTIONS AND PRESCRIPTIONS GIVEN. AAOX3. PT IN NO APPARENT DISTRESS. THE OPPORTUNITY TO ASK QUESTIONS WAS PROVIDED. 

## 2016-02-10 ENCOUNTER — Encounter (HOSPITAL_COMMUNITY): Payer: Self-pay | Admitting: Emergency Medicine

## 2016-02-10 ENCOUNTER — Emergency Department (HOSPITAL_COMMUNITY)
Admission: EM | Admit: 2016-02-10 | Discharge: 2016-02-11 | Disposition: A | Discharge status: Home / Self Care | Payer: No Typology Code available for payment source | Attending: Emergency Medicine | Admitting: Emergency Medicine

## 2016-02-10 DIAGNOSIS — R509 Fever, unspecified: Secondary | ICD-10-CM | POA: Insufficient documentation

## 2016-02-10 DIAGNOSIS — J029 Acute pharyngitis, unspecified: Secondary | ICD-10-CM | POA: Insufficient documentation

## 2016-02-10 DIAGNOSIS — R51 Headache: Secondary | ICD-10-CM | POA: Insufficient documentation

## 2016-02-10 DIAGNOSIS — E669 Obesity, unspecified: Secondary | ICD-10-CM | POA: Insufficient documentation

## 2016-02-10 NOTE — ED Notes (Signed)
Patient presents for generalized body aches, sore throat, fever (didn't check at home), headache x3 days. Denies N/V/D, visual changes, lightheadedness, dizziness, tolerating PO fluids.

## 2016-02-11 NOTE — ED Notes (Signed)
[facility]

## 2016-02-11 NOTE — ED Notes (Signed)
Patient not in waiting room or restroom. 

## 2016-02-11 NOTE — ED Notes (Signed)
No answer from waiting room.

## 2016-04-11 DIAGNOSIS — E663 Overweight: Secondary | ICD-10-CM | POA: Diagnosis not present

## 2016-04-11 DIAGNOSIS — Z113 Encounter for screening for infections with a predominantly sexual mode of transmission: Secondary | ICD-10-CM | POA: Diagnosis not present

## 2016-04-11 DIAGNOSIS — Z1272 Encounter for screening for malignant neoplasm of vagina: Secondary | ICD-10-CM | POA: Diagnosis not present

## 2016-04-11 LAB — PROCEDURE REPORT - SCANNED: Pap: NEGATIVE

## 2016-09-03 ENCOUNTER — Encounter: Payer: Self-pay | Admitting: *Deleted

## 2016-09-26 DIAGNOSIS — N939 Abnormal uterine and vaginal bleeding, unspecified: Secondary | ICD-10-CM | POA: Diagnosis not present

## 2016-09-26 DIAGNOSIS — N925 Other specified irregular menstruation: Secondary | ICD-10-CM | POA: Diagnosis not present

## 2016-09-26 DIAGNOSIS — N979 Female infertility, unspecified: Secondary | ICD-10-CM | POA: Diagnosis not present

## 2016-09-26 DIAGNOSIS — Z113 Encounter for screening for infections with a predominantly sexual mode of transmission: Secondary | ICD-10-CM | POA: Diagnosis not present

## 2016-10-04 DIAGNOSIS — N979 Female infertility, unspecified: Secondary | ICD-10-CM | POA: Diagnosis not present

## 2016-10-14 ENCOUNTER — Ambulatory Visit (HOSPITAL_COMMUNITY)
Admission: EM | Admit: 2016-10-14 | Discharge: 2016-10-14 | Disposition: A | Discharge status: Home / Self Care | Payer: No Typology Code available for payment source

## 2016-10-16 ENCOUNTER — Encounter (HOSPITAL_COMMUNITY): Payer: Self-pay | Admitting: *Deleted

## 2016-10-16 ENCOUNTER — Inpatient Hospital Stay (HOSPITAL_COMMUNITY)
Admission: AD | Admit: 2016-10-16 | Discharge: 2016-10-16 | Disposition: A | Discharge status: Home / Self Care | Payer: BLUE CROSS/BLUE SHIELD | Source: Ambulatory Visit | Attending: Obstetrics and Gynecology | Admitting: Obstetrics and Gynecology

## 2016-10-16 DIAGNOSIS — N939 Abnormal uterine and vaginal bleeding, unspecified: Secondary | ICD-10-CM

## 2016-10-16 HISTORY — DX: Other specified health status: Z78.9

## 2016-10-16 LAB — CBC
HCT: 31.4 % — ABNORMAL LOW (ref 36.0–46.0)
HEMOGLOBIN: 10.6 g/dL — AB (ref 12.0–15.0)
MCH: 26.4 pg (ref 26.0–34.0)
MCHC: 33.8 g/dL (ref 30.0–36.0)
MCV: 78.3 fL (ref 78.0–100.0)
PLATELETS: 330 10*3/uL (ref 150–400)
RBC: 4.01 MIL/uL (ref 3.87–5.11)
RDW: 14.9 % (ref 11.5–15.5)
WBC: 9.8 10*3/uL (ref 4.0–10.5)

## 2016-10-16 NOTE — Discharge Instructions (Signed)

## 2016-10-16 NOTE — MAU Note (Signed)
Bleeding, started a medication to stop bleeding, only stopped it for 10 days, started back 5 days ago.  Is heavy , changing overnight pads, every 30 min

## 2016-10-16 NOTE — MAU Provider Note (Signed)
History     CSN: [id number]  Arrival date and time: 10/16/16 1722   First Provider Initiated Contact with Patient 10/16/16 1947      Chief Complaint  Patient presents with  . Vaginal Bleeding   HPI Shannon Francis is 25 y.o. G1P0010 presents with report of heavy vaginal bleeding.  She is currently being treated by Dr. Normand Sloopillard for same sxs.  Hx of abnormal bleeding treated in past with OCPs.  Treated recently with Francis medication to stop bleeding.  When I asked she thought Provera sounded "right".   AFter she began the medication, the bleeding stopped for 10 days.  She was told she shouldn't bleed for another 14 days. Today is the 8th straight day of bleeding with clots and soaking through an overnight pad q30 minutes.  She has sonohysterogram scheduled for  10/18/2016.     Past Medical History:  Diagnosis Date  . Medical history non-contributory   . Obesity     Past Surgical History:  Procedure Laterality Date  . ANTERIOR CRUCIATE LIGAMENT REPAIR     left  . KNEE SURGERY      Family History  Problem Relation Age of Onset  . Hypertension Mother   . Cancer Sister   . Diabetes Other     Social History  Substance Use Topics  . Smoking status: Never Smoker  . Smokeless tobacco: Never Used  . Alcohol use No    Allergies:  Allergies  Allergen Reactions  . Hydrocodone Nausea And Vomiting    Prescriptions Prior to Admission  Medication Sig Dispense Refill Last Dose  . ibuprofen (ADVIL,MOTRIN) 800 MG tablet Take 1 tablet (800 mg total) by mouth every 8 (eight) hours as needed. 21 tablet 0   . traMADol (ULTRAM) 50 MG tablet Take 1 tablet (50 mg total) by mouth every 6 (six) hours as needed. 15 tablet 0     Review of Systems  Constitutional: Negative for chills and fever.  Gastrointestinal: Positive for abdominal pain (cramping). Negative for nausea and vomiting.  Genitourinary: Negative for dysuria, frequency, hematuria and urgency.       + for heavy vaginal bleeding.   Neurological: Negative for dizziness, weakness and headaches.   Physical Exam   Blood pressure 143/90, pulse 86, temperature 99 F (37.2 C), temperature source Oral, resp. rate 18, height 5\' 4"  (1.626 m), weight (!) 370 lb 12.8 oz (168.2 kg), last menstrual period 10/12/2016.  Physical Exam  Nursing note and vitals reviewed. Constitutional: She is oriented to person, place, and time. She appears well-developed and well-nourished.  HENT:  Head: Normocephalic.  Neck: Normal range of motion.  GI: There is no tenderness. There is no rebound and no guarding.  Genitourinary: There is no rash, tenderness or lesion on the right labia. There is no rash, tenderness or lesion on the left labia. Uterus is not tender. Enlarged: mild tenderness on exam. Cervix exhibits no motion tenderness, no discharge and no friability. Right adnexum displays no mass, no tenderness and no fullness. Left adnexum displays no tenderness and no fullness. There is bleeding (moderate amount of bright red bleeding with Francis few small clots) in the vagina. No erythema or tenderness in the vagina. No foreign body in the vagina.  Genitourinary Comments: Exam is limited by body habitus  Neurological: She is alert and oriented to person, place, and time.  Skin: Skin is warm and dry.  Psychiatric: She has Francis normal mood and affect. Her behavior is normal. Thought content normal.  Results for orders placed or performed during the hospital encounter of 10/16/16 (from the past 24 hour(s))  CBC     Status: Abnormal (Preliminary result)   Collection Time: 10/16/16  9:32 PM  Result Value Ref Range   WBC PENDING 4.0 - 10.5 K/uL   RBC 4.01 3.87 - 5.11 MIL/uL   Hemoglobin 10.6 (L) 12.0 - 15.0 g/dL   HCT 09.831.4 (L) 11.936.0 - 14.746.0 %   MCV 78.3 78.0 - 100.0 fL   MCH 26.4 26.0 - 34.0 pg   MCHC 33.8 30.0 - 36.0 g/dL   RDW 82.914.9 56.211.5 - 13.015.5 %   Platelets PENDING 150 - 400 K/uL    MAU Course  Procedures  Cultures not done, patient states they  were done recently in the office  MDM MSE Labs Exam  Care turned over to H. Mathews RobinsonsHogan, CNM at 21:10  ASSESSMENT AND PLAN Francis:  Abnormal uterine bleeding  DC home Comfort measures reviewed  Bleeding precautions RX: none  Return to MAU as needed FU with OB as planned  Follow-up Information    Shannon Francis,Shannon A, MD Follow up.   Specialty:  Obstetrics and Gynecology Contact information: [address] [place] [address] [phone]            Matt HolmesKEY,EVE M 10/16/2016, 7:58 PM

## 2016-10-17 DIAGNOSIS — N939 Abnormal uterine and vaginal bleeding, unspecified: Secondary | ICD-10-CM | POA: Diagnosis not present

## 2016-10-30 DIAGNOSIS — N979 Female infertility, unspecified: Secondary | ICD-10-CM | POA: Diagnosis not present

## 2016-12-08 DIAGNOSIS — N939 Abnormal uterine and vaginal bleeding, unspecified: Secondary | ICD-10-CM | POA: Diagnosis not present

## 2017-01-01 DIAGNOSIS — N939 Abnormal uterine and vaginal bleeding, unspecified: Secondary | ICD-10-CM | POA: Diagnosis not present

## 2017-07-11 ENCOUNTER — Encounter (HOSPITAL_COMMUNITY): Payer: Self-pay | Admitting: Emergency Medicine

## 2017-07-11 ENCOUNTER — Ambulatory Visit (HOSPITAL_COMMUNITY)
Admission: EM | Admit: 2017-07-11 | Discharge: 2017-07-11 | Disposition: A | Discharge status: Home / Self Care | Payer: BLUE CROSS/BLUE SHIELD | Attending: Family Medicine | Admitting: Family Medicine

## 2017-07-11 DIAGNOSIS — N939 Abnormal uterine and vaginal bleeding, unspecified: Secondary | ICD-10-CM | POA: Diagnosis not present

## 2017-07-11 DIAGNOSIS — R102 Pelvic and perineal pain: Secondary | ICD-10-CM

## 2017-07-11 LAB — POCT PREGNANCY, URINE: PREG TEST UR: NEGATIVE

## 2017-07-11 MED ORDER — MEDROXYPROGESTERONE ACETATE 5 MG PO TABS: 5.0000 mg | ORAL_TABLET | Freq: Every day | ORAL | 0 refills | Status: DC

## 2017-07-11 NOTE — Discharge Instructions (Signed)
I'm going to prescribe you Provera as previous prescribed. Please follow up with OBGYN in the next week to discuss next steps. Please take tylenol and ibuprofen for the pain.

## 2017-07-11 NOTE — ED Triage Notes (Signed)
[facility] a [facility] discharge and pelvic pain x 3 days.

## 2017-07-11 NOTE — ED Provider Notes (Signed)
CSN: [id number]     Arrival date & time 07/11/17  1413 History   None    Chief Complaint  Patient presents with  . Vaginal Discharge  . Pelvic Pain   (Consider location/radiation/quality/duration/timing/severity/associated sxs/prior Treatment) HPI  Patient presenting with pelvic pain for about 3 days. Stabbing pain that comes ago. Patient states that the more active she is more pelvic pain she has. Patient states she has always had irregular periods. She often has periods that last for weeks. This menstrual period has been on/off for 3 weeks. However that 3 days patient has had heavy periods. No vaginal discharge. Patient has taken 650 mg of ibuprofen every 8 hours without much relief. Patient is sexually active. Is not using any contraception as she is trying to get pregnant. No concerns about STDs. Patient had pelvic pain similar to this [place]she went to [facility] this pain. Patient was given Provera; patient ran out of provera. Per patient, her OBGYN, has previously done ultrasound. No pain with urination   Symptoms Fever: None  Dysuria:None  Vaginal bleeding: Yes  Abdomen or Pelvic pain: Yes  Genital sores or ulcers:None Pain during sex: None    ROS see HPI Smoking Status noted   Past Medical History:  Diagnosis Date  . Medical history non-contributory   . Obesity    Past Surgical History:  Procedure Laterality Date  . ANTERIOR CRUCIATE LIGAMENT REPAIR     left  . KNEE SURGERY     Family History  Problem Relation Age of Onset  . Hypertension Mother   . Cancer Sister   . Diabetes Other    Social History  Substance Use Topics  . Smoking status: Never Smoker  . Smokeless tobacco: Never Used  . Alcohol use No   OB History    Gravida Para Term Preterm AB Living   1       1 0   SAB TAB Ectopic Multiple Live Births     1           Review of Systems  All other systems reviewed and are negative.   Allergies  Hydrocodone  Home Medications   Prior  to Admission medications   Medication Sig Start Date End Date Taking? Authorizing Provider  ibuprofen (ADVIL,MOTRIN) 800 MG tablet Take 1 tablet (800 mg total) by mouth every 8 (eight) hours as needed. 12/10/15  Yes Lawyer, Cristal Deerhristopher, PA-C  medroxyPROGESTERone (PROVERA) 5 MG tablet Take 1 tablet (5 mg total) by mouth daily. 07/11/17   Aziah Kaiser, Antionette PolesAsiyah Zahra, MD   Meds Ordered and Administered this Visit  Medications - No data to display  BP (!) 148/84 (BP Location: Right Arm)   Pulse 77   Temp 99.1 F (37.3 C) (Oral)   Resp 20   SpO2 100%  No data found.   Physical Exam  Constitutional: She is oriented to person, place, and time. She appears well-developed and well-nourished.  Eyes: Conjunctivae are normal.  Neck: Normal range of motion.  Cardiovascular: Normal rate.   Pulmonary/Chest: Effort normal and breath sounds normal.  Abdominal: Soft.  Very obese abdomen, slight suprapubic tenderness, bowel sounds present, no other pain throughout   Musculoskeletal: Normal range of motion.  Neurological: She is alert and oriented to person, place, and time.  Skin: Skin is warm and dry.    Urgent Care Course     Procedures (including critical care time)  Labs Review Labs Reviewed  POCT PREGNANCY, URINE    Imaging Review No  results found.   MDM   1. Abnormal uterine bleeding    Long-standing history of irregular vaginal bleeding followed by Dr. Normand Sloopillard her OB/GYN. Slight suprapubic pain consistent with menstrual period, negative pregnancy test. Provided Provera to patient as previously given by OBGYN to stop menstrual period     Berton BonMikell, Zyion Leidner Zahra, MD 07/11/17 [phone]

## 2018-01-23 DIAGNOSIS — Z6841 Body Mass Index (BMI) 40.0 and over, adult: Secondary | ICD-10-CM | POA: Diagnosis not present

## 2018-01-23 DIAGNOSIS — N915 Oligomenorrhea, unspecified: Secondary | ICD-10-CM | POA: Diagnosis not present

## 2018-03-27 DIAGNOSIS — N915 Oligomenorrhea, unspecified: Secondary | ICD-10-CM | POA: Diagnosis not present

## 2018-03-27 DIAGNOSIS — N939 Abnormal uterine and vaginal bleeding, unspecified: Secondary | ICD-10-CM | POA: Diagnosis not present

## 2018-08-07 DIAGNOSIS — N939 Abnormal uterine and vaginal bleeding, unspecified: Secondary | ICD-10-CM | POA: Diagnosis not present

## 2018-08-08 ENCOUNTER — Other Ambulatory Visit: Payer: Self-pay | Admitting: Obstetrics and Gynecology

## 2018-08-13 ENCOUNTER — Ambulatory Visit (HOSPITAL_COMMUNITY): Payer: BLUE CROSS/BLUE SHIELD | Admitting: Anesthesiology

## 2018-08-13 ENCOUNTER — Encounter (HOSPITAL_COMMUNITY)
Admission: RE | Disposition: A | Discharge status: Home / Self Care | Payer: Self-pay | Source: Ambulatory Visit | Attending: Obstetrics and Gynecology

## 2018-08-13 ENCOUNTER — Other Ambulatory Visit: Payer: Self-pay

## 2018-08-13 ENCOUNTER — Encounter (HOSPITAL_COMMUNITY): Payer: Self-pay

## 2018-08-13 ENCOUNTER — Ambulatory Visit (HOSPITAL_COMMUNITY)
Admission: RE | Admit: 2018-08-13 | Discharge: 2018-08-13 | Disposition: A | Discharge status: Home / Self Care | Payer: BLUE CROSS/BLUE SHIELD | Source: Ambulatory Visit | Attending: Obstetrics and Gynecology | Admitting: Obstetrics and Gynecology

## 2018-08-13 DIAGNOSIS — Z79899 Other long term (current) drug therapy: Secondary | ICD-10-CM | POA: Diagnosis not present

## 2018-08-13 DIAGNOSIS — N939 Abnormal uterine and vaginal bleeding, unspecified: Secondary | ICD-10-CM | POA: Diagnosis not present

## 2018-08-13 DIAGNOSIS — N840 Polyp of corpus uteri: Secondary | ICD-10-CM | POA: Diagnosis not present

## 2018-08-13 DIAGNOSIS — N871 Moderate cervical dysplasia: Secondary | ICD-10-CM | POA: Diagnosis not present

## 2018-08-13 DIAGNOSIS — Z885 Allergy status to narcotic agent status: Secondary | ICD-10-CM | POA: Diagnosis not present

## 2018-08-13 DIAGNOSIS — Z6841 Body Mass Index (BMI) 40.0 and over, adult: Secondary | ICD-10-CM | POA: Diagnosis not present

## 2018-08-13 HISTORY — DX: Headache, unspecified: R51.9

## 2018-08-13 HISTORY — DX: Headache: R51

## 2018-08-13 HISTORY — PX: DILATATION & CURETTAGE/HYSTEROSCOPY WITH MYOSURE: SHX6511

## 2018-08-13 LAB — CBC
HEMATOCRIT: 32.9 % — AB (ref 36.0–46.0)
HEMOGLOBIN: 10.4 g/dL — AB (ref 12.0–15.0)
MCH: 25.4 pg — ABNORMAL LOW (ref 26.0–34.0)
MCHC: 31.6 g/dL (ref 30.0–36.0)
MCV: 80.2 fL (ref 78.0–100.0)
Platelets: 366 10*3/uL (ref 150–400)
RBC: 4.1 MIL/uL (ref 3.87–5.11)
RDW: 15.3 % (ref 11.5–15.5)
WBC: 8.6 10*3/uL (ref 4.0–10.5)

## 2018-08-13 LAB — HCG, SERUM, QUALITATIVE: Preg, Serum: NEGATIVE

## 2018-08-13 SURGERY — DILATATION & CURETTAGE/HYSTEROSCOPY WITH MYOSURE
Anesthesia: General

## 2018-08-13 MED ORDER — SUGAMMADEX SODIUM 500 MG/5ML IV SOLN
INTRAVENOUS | Status: AC
  Filled 2018-08-13: qty 5

## 2018-08-13 MED ORDER — ROCURONIUM BROMIDE 100 MG/10ML IV SOLN
INTRAVENOUS | Status: AC
  Filled 2018-08-13: qty 1

## 2018-08-13 MED ORDER — SCOPOLAMINE 1 MG/3DAYS TD PT72
MEDICATED_PATCH | TRANSDERMAL | Status: AC
  Filled 2018-08-13: qty 1

## 2018-08-13 MED ORDER — BUPIVACAINE HCL (PF) 0.25 % IJ SOLN
INTRAMUSCULAR | Status: AC
  Filled 2018-08-13: qty 30

## 2018-08-13 MED ORDER — DEXAMETHASONE SODIUM PHOSPHATE 10 MG/ML IJ SOLN
INTRAMUSCULAR | Status: AC
  Filled 2018-08-13: qty 1

## 2018-08-13 MED ORDER — OXYMETAZOLINE HCL 0.05 % NA SOLN
1.0000 | Freq: Two times a day (BID) | NASAL | Status: DC
  Filled 2018-08-13: qty 15

## 2018-08-13 MED ORDER — ONDANSETRON HCL 4 MG/2ML IJ SOLN
INTRAMUSCULAR | Status: AC
  Filled 2018-08-13: qty 2

## 2018-08-13 MED ORDER — GLYCOPYRROLATE 0.2 MG/ML IJ SOLN
INTRAMUSCULAR | Status: AC
  Filled 2018-08-13: qty 1

## 2018-08-13 MED ORDER — PROPOFOL 10 MG/ML IV BOLUS
INTRAVENOUS | Status: DC | PRN
  Administered 2018-08-13: 250 mg via INTRAVENOUS

## 2018-08-13 MED ORDER — FENTANYL CITRATE (PF) 100 MCG/2ML IJ SOLN
INTRAMUSCULAR | Status: DC | PRN
  Administered 2018-08-13 (×2): 50 ug via INTRAVENOUS

## 2018-08-13 MED ORDER — METOCLOPRAMIDE HCL 5 MG/ML IJ SOLN: 10.0000 mg | Freq: Once | INTRAMUSCULAR | Status: DC | PRN

## 2018-08-13 MED ORDER — FENTANYL CITRATE (PF) 100 MCG/2ML IJ SOLN: 25.0000 ug | INTRAMUSCULAR | Status: DC | PRN

## 2018-08-13 MED ORDER — PROPOFOL 10 MG/ML IV BOLUS
INTRAVENOUS | Status: AC
  Filled 2018-08-13: qty 40

## 2018-08-13 MED ORDER — KETOROLAC TROMETHAMINE 30 MG/ML IJ SOLN
INTRAMUSCULAR | Status: DC | PRN
  Administered 2018-08-13: 30 mg via INTRAVENOUS

## 2018-08-13 MED ORDER — LACTATED RINGERS IV SOLN: INTRAVENOUS | Status: DC

## 2018-08-13 MED ORDER — SODIUM CHLORIDE 0.9 % IR SOLN
Status: DC | PRN
  Administered 2018-08-13: 3000 mL

## 2018-08-13 MED ORDER — MIDAZOLAM HCL 2 MG/2ML IJ SOLN
INTRAMUSCULAR | Status: AC
  Filled 2018-08-13: qty 2

## 2018-08-13 MED ORDER — SCOPOLAMINE 1 MG/3DAYS TD PT72
1.0000 | MEDICATED_PATCH | Freq: Once | TRANSDERMAL | Status: DC
  Administered 2018-08-13: 1.5 mg via TRANSDERMAL

## 2018-08-13 MED ORDER — GLYCOPYRROLATE 0.2 MG/ML IJ SOLN
INTRAMUSCULAR | Status: DC | PRN
  Administered 2018-08-13 (×2): 0.1 mg via INTRAVENOUS

## 2018-08-13 MED ORDER — TRAMADOL HCL 50 MG PO TABS: 50.0000 mg | ORAL_TABLET | Freq: Four times a day (QID) | ORAL | 0 refills | Status: DC | PRN

## 2018-08-13 MED ORDER — FENTANYL CITRATE (PF) 100 MCG/2ML IJ SOLN
INTRAMUSCULAR | Status: AC
  Filled 2018-08-13: qty 2

## 2018-08-13 MED ORDER — ROCURONIUM BROMIDE 100 MG/10ML IV SOLN
INTRAVENOUS | Status: AC
  Filled 2018-08-13: qty 2

## 2018-08-13 MED ORDER — LACTATED RINGERS IV SOLN
INTRAVENOUS | Status: DC
  Administered 2018-08-13: 15:00:00 via INTRAVENOUS
  Administered 2018-08-13: 1000 mL via INTRAVENOUS

## 2018-08-13 MED ORDER — LIDOCAINE HCL (CARDIAC) PF 100 MG/5ML IV SOSY
PREFILLED_SYRINGE | INTRAVENOUS | Status: AC
  Filled 2018-08-13: qty 5

## 2018-08-13 MED ORDER — BUPIVACAINE HCL (PF) 0.25 % IJ SOLN
INTRAMUSCULAR | Status: DC | PRN
  Administered 2018-08-13: 20 mL

## 2018-08-13 MED ORDER — MEPERIDINE HCL 25 MG/ML IJ SOLN: 6.2500 mg | INTRAMUSCULAR | Status: DC | PRN

## 2018-08-13 MED ORDER — SODIUM CHLORIDE 0.9 % IJ SOLN
INTRAMUSCULAR | Status: AC
  Filled 2018-08-13: qty 50

## 2018-08-13 MED ORDER — VASOPRESSIN 20 UNIT/ML IV SOLN
INTRAVENOUS | Status: AC
  Filled 2018-08-13: qty 1

## 2018-08-13 MED ORDER — ONDANSETRON HCL 4 MG/2ML IJ SOLN
INTRAMUSCULAR | Status: DC | PRN
  Administered 2018-08-13: 4 mg via INTRAVENOUS

## 2018-08-13 MED ORDER — DEXAMETHASONE SODIUM PHOSPHATE 4 MG/ML IJ SOLN
INTRAMUSCULAR | Status: DC | PRN
  Administered 2018-08-13: 4 mg via INTRAVENOUS

## 2018-08-13 MED ORDER — LIDOCAINE HCL (CARDIAC) PF 100 MG/5ML IV SOSY
PREFILLED_SYRINGE | INTRAVENOUS | Status: DC | PRN
  Administered 2018-08-13: 100 mg via INTRAVENOUS

## 2018-08-13 MED ORDER — MIDAZOLAM HCL 2 MG/2ML IJ SOLN
INTRAMUSCULAR | Status: DC | PRN
  Administered 2018-08-13: 2 mg via INTRAVENOUS

## 2018-08-13 MED ORDER — METOCLOPRAMIDE HCL 5 MG/ML IJ SOLN
INTRAMUSCULAR | Status: DC | PRN
  Administered 2018-08-13: 10 mg via INTRAVENOUS

## 2018-08-13 MED ORDER — KETOROLAC TROMETHAMINE 30 MG/ML IJ SOLN
INTRAMUSCULAR | Status: AC
  Filled 2018-08-13: qty 1

## 2018-08-13 MED ORDER — PHENYLEPHRINE HCL 0.5 % NA SOLN: 1.0000 [drp] | Freq: Four times a day (QID) | NASAL | Status: DC | PRN

## 2018-08-13 MED ORDER — VASOPRESSIN 20 UNIT/ML IV SOLN
INTRAVENOUS | Status: DC | PRN
  Administered 2018-08-13: 18 mL via INTRAMUSCULAR

## 2018-08-13 SURGICAL SUPPLY — 20 items
CANISTER SUCT 3000ML PPV (MISCELLANEOUS) ×2 IMPLANT
CATH ROBINSON RED A/P 16FR (CATHETERS) ×2 IMPLANT
DECANTER SPIKE VIAL GLASS SM (MISCELLANEOUS) ×4 IMPLANT
DEVICE MYOSURE LITE (MISCELLANEOUS) ×2 IMPLANT
DEVICE MYOSURE REACH (MISCELLANEOUS) IMPLANT
FILTER ARTHROSCOPY CONVERTOR (FILTER) ×2 IMPLANT
GLOVE BIO SURGEON STRL SZ7.5 (GLOVE) ×2 IMPLANT
GLOVE BIOGEL PI IND STRL 7.0 (GLOVE) ×1 IMPLANT
GLOVE BIOGEL PI INDICATOR 7.0 (GLOVE) ×1
GOWN STRL REUS W/TWL LRG LVL3 (GOWN DISPOSABLE) ×4 IMPLANT
HOVERMATT SINGLE USE (MISCELLANEOUS) ×2 IMPLANT
NEEDLE SPNL 22GX3.5 QUINCKE BK (NEEDLE) ×2 IMPLANT
PACK VAGINAL MINOR WOMEN LF (CUSTOM PROCEDURE TRAY) ×2 IMPLANT
PAD OB MATERNITY 4.3X12.25 (PERSONAL CARE ITEMS) ×2 IMPLANT
SEAL ROD LENS SCOPE MYOSURE (ABLATOR) ×2 IMPLANT
SYR CONTROL 10ML LL (SYRINGE) ×2 IMPLANT
SYR TB 1ML 25GX5/8 (SYRINGE) ×2 IMPLANT
TOWEL OR 17X24 6PK STRL BLUE (TOWEL DISPOSABLE) ×4 IMPLANT
TUBING AQUILEX INFLOW (TUBING) ×2 IMPLANT
TUBING AQUILEX OUTFLOW (TUBING) ×2 IMPLANT

## 2018-08-13 NOTE — Op Note (Signed)
08/13/2018  2:53 PM  PATIENT:  Shannon Francis  27 y.o. female  PRE-OPERATIVE DIAGNOSIS:  Abnormal Uterine Bleeding  POST-OPERATIVE DIAGNOSIS:  Abnormal Uterine Bleeding  PROCEDURE:  Procedure(s): DILATATION & CURETTAGE DIAGNOSTIC HYSTEROSCOPY WITH MYOSURE  SURGEON:  Surgeon(s): Olivia Mackie, MD  ASSISTANTS: none   ANESTHESIA:   local and general  ESTIMATED BLOOD LOSS: MINIMAL  DRAINS: none   LOCAL MEDICATIONS USED:  MARCAINE    and Amount: 20 ml  SPECIMEN:  Source of Specimen:  EMC AND POLYPOID FRAGMENTS  DISPOSITION OF SPECIMEN:  PATHOLOGY  COUNTS:  YES  DICTATION #: [id number]  PLAN OF CARE: DC HOME  PATIENT DISPOSITION:  PACU - hemodynamically stable.

## 2018-08-13 NOTE — Anesthesia Procedure Notes (Signed)
Procedure Name: LMA Insertion Date/Time: 08/13/2018 2:16 PM Performed by: Earmon Phoenix, CRNA Pre-anesthesia Checklist: Patient identified, Emergency Drugs available, Suction available, Patient being monitored and Timeout performed Patient Re-evaluated:Patient Re-evaluated prior to induction Oxygen Delivery Method: Circle system utilized Preoxygenation: Pre-oxygenation with 100% oxygen Induction Type: IV induction Ventilation: Mask ventilation without difficulty and Oral airway inserted - appropriate to patient size LMA: LMA with gastric port inserted LMA Size: 4.0 Number of attempts: 1 Intubation method: blankwts under head and chest for optimal sniffing. Placement Confirmation: positive ETCO2,  CO2 detector and breath sounds checked- equal and bilateral Tube secured with: Tape Dental Injury: Teeth and Oropharynx as per pre-operative assessment

## 2018-08-13 NOTE — Discharge Instructions (Signed)
DISCHARGE INSTRUCTIONS: HYSTEROSCOPY / ENDOMETRIAL ABLATION The following instructions have been prepared to help you care for yourself upon your return home.  May Remove Scop patch on or before Friday Morning.  May take Ibuprofen after 8:30 PM today.  May take stool softner while taking narcotic pain medication to prevent constipation.  Drink plenty of water.  Personal hygiene:  Use sanitary pads for vaginal drainage, not tampons.  Shower the day after your procedure.  NO tub baths, pools or Jacuzzis for 2-3 weeks.  Wipe front to back after using the bathroom.  Activity and limitations:  Do NOT drive or operate any equipment for 24 hours. The effects of anesthesia are still present and drowsiness may result.  Do NOT rest in bed all day.  Walking is encouraged.  Walk up and down stairs slowly.  You may resume your normal activity in one to two days or as indicated by your physician.  Sexual activity: NO intercourse for at least 2 weeks after the procedure, or as indicated by your Doctor.  Diet: Eat a light meal as desired this evening. You may resume your usual diet tomorrow.  Return to Work: You may resume your work activities in one to two days or as indicated by Therapist, sports.  What to expect after your surgery: Expect to have vaginal bleeding/discharge for 2-3 days and spotting for up to 10 days. It is not unusual to have soreness for up to 1-2 weeks. You may have a slight burning sensation when you urinate for the first day. Mild cramps may continue for a couple of days. You may have a regular period in 2-6 weeks.  Call your doctor for any of the following:  Excessive vaginal bleeding or clotting, saturating and changing one pad every hour.  Inability to urinate 6 hours after discharge from hospital.  Pain not relieved by pain medication.  Fever of 100.4 F or greater.  Unusual vaginal discharge or odor.   Post Anesthesia Home Care  Instructions  Activity: Get plenty of rest for the remainder of the day. A responsible individual must stay with you for 24 hours following the procedure.  For the next 24 hours, DO NOT: -Drive a car -Advertising copywriter -Drink alcoholic beverages -Take any medication unless instructed by your physician -Make any legal decisions or sign important papers.  Meals: Start with liquid foods such as gelatin or soup. Progress to regular foods as tolerated. Avoid greasy, spicy, heavy foods. If nausea and/or vomiting occur, drink only clear liquids until the nausea and/or vomiting subsides. Call your physician if vomiting continues.  Special Instructions/Symptoms: Your throat may feel dry or sore from the anesthesia or the breathing tube placed in your throat during surgery. If this causes discomfort, gargle with warm salt water. The discomfort should disappear within 24 hours.  If you had a scopolamine patch placed behind your ear for the management of post- operative nausea and/or vomiting:  1. The medication in the patch is effective for 72 hours, after which it should be removed.  Wrap patch in a tissue and discard in the trash. Wash hands thoroughly with soap and water. 2. You may remove the patch earlier than 72 hours if you experience unpleasant side effects which may include dry mouth, dizziness or visual disturbances. 3. Avoid touching the patch. Wash your hands with soap and water after contact with the patch.

## 2018-08-13 NOTE — Anesthesia Postprocedure Evaluation (Signed)
Anesthesia Post Note  Patient: Shannon Francis  Procedure(s) Performed: DILATATION & CURETTAGE/HYSTEROSCOPY WITH MYOSURE (N/A )     Patient location during evaluation: PACU Anesthesia Type: General Level of consciousness: awake and alert Pain management: pain level controlled Vital Signs Assessment: post-procedure vital signs reviewed and stable Respiratory status: spontaneous breathing, nonlabored ventilation, respiratory function stable and patient connected to nasal cannula oxygen Cardiovascular status: blood pressure returned to baseline and stable Postop Assessment: no apparent nausea or vomiting Anesthetic complications: yes Anesthetic complication details: anesthesia complicationsComments: Post op epistaxis from nasal trumpet. Ceased after packing with surgicell.    Last Vitals:  Vitals:   08/13/18 1545 08/13/18 1705  BP: (!) 113/58 (!) 155/91  Pulse: 86 71  Resp: (!) 22 20  Temp:    SpO2: 96% 97%    Last Pain:  Vitals:   08/13/18 1530  TempSrc:   PainSc: 0-No pain   Pain Goal: Patients Stated Pain Goal: 3 (08/13/18 1234)               Phillips Grout

## 2018-08-13 NOTE — H&P (Signed)
Shannon Francis is an 27 y.o. female. AUB with structural lesion of DIag HS and myosure.  Pertinent Gynecological History: Menses: regular every irregularly days without intermenstrual spotting Bleeding: intermenstrual bleeding Contraception: none DES exposure: denies Blood transfusions: none Sexually transmitted diseases: no past history Previous GYN Procedures: na  Last mammogram: na Date: na Last pap: normal Date: 2019 OB History: G0, P0   Menstrual History: Menarche age: 3 No LMP recorded. (Menstrual status: Irregular Periods).    Past Medical History:  Diagnosis Date  . Headache   . Medical history non-contributory   . Obesity     Past Surgical History:  Procedure Laterality Date  . ANTERIOR CRUCIATE LIGAMENT REPAIR     left  . INCISION AND DRAINAGE     chest  . KNEE SURGERY      Family History  Problem Relation Age of Onset  . Hypertension Mother   . Cancer Sister   . Diabetes Other     Social History:  reports that she has never smoked. She has never used smokeless tobacco. She reports that she does not drink alcohol or use drugs.  Allergies:  Allergies  Allergen Reactions  . Hydrocodone Nausea And Vomiting    Medications Prior to Admission  Medication Sig Dispense Refill Last Dose  . medroxyPROGESTERone (PROVERA) 10 MG tablet Take 10 mg by mouth daily.  0 Past Month at Unknown time  . ibuprofen (ADVIL,MOTRIN) 800 MG tablet Take 1 tablet (800 mg total) by mouth every 8 (eight) hours as needed. (Patient not taking: Reported on 08/08/2018) 21 tablet 0 Not Taking at Unknown time  . medroxyPROGESTERone (PROVERA) 5 MG tablet Take 1 tablet (5 mg total) by mouth daily. (Patient not taking: Reported on 08/08/2018) 30 tablet 0 Not Taking at Unknown time    Review of Systems  Constitutional: Negative.   All other systems reviewed and are negative.   Blood pressure (!) 145/95, pulse 72, temperature 98.4 F (36.9 C), temperature source Oral, resp. rate 16,  height 5\' 5"  (1.651 m), weight (!) 164.9 kg, SpO2 100 %. Physical Exam  Nursing note and vitals reviewed. Constitutional: She is oriented to person, place, and time. She appears well-developed and well-nourished.  HENT:  Head: Atraumatic.  Neck: Normal range of motion. Neck supple.  Cardiovascular: Normal rate and regular rhythm.  Respiratory: Effort normal and breath sounds normal.  GI: Soft. Bowel sounds are normal.  Genitourinary: Vagina normal and uterus normal.  Musculoskeletal: Normal range of motion.  Neurological: She is alert and oriented to person, place, and time. She has normal reflexes.  Skin: Skin is warm and dry.  Psychiatric: She has a normal mood and affect.    Results for orders placed or performed during the hospital encounter of 08/13/18 (from the past 24 hour(s))  CBC     Status: Abnormal   Collection Time: 08/13/18 12:23 PM  Result Value Ref Range   WBC 8.6 4.0 - 10.5 K/uL   RBC 4.10 3.87 - 5.11 MIL/uL   Hemoglobin 10.4 (L) 12.0 - 15.0 g/dL   HCT 78.2 (L) 95.6 - 21.3 %   MCV 80.2 78.0 - 100.0 fL   MCH 25.4 (L) 26.0 - 34.0 pg   MCHC 31.6 30.0 - 36.0 g/dL   RDW 08.6 57.8 - 46.9 %   Platelets 366 150 - 400 K/uL  hCG, serum, qualitative     Status: None   Collection Time: 08/13/18 12:23 PM  Result Value Ref Range   Preg, Serum NEGATIVE  NEGATIVE    No results found.  Assessment/Plan: AUB with structural lesion DIag HS with D&C , Myosure Risks vs benefits of surgery discussed. Consent done.  Jeromie Gainor J 08/13/2018, 1:10 PM

## 2018-08-13 NOTE — Anesthesia Preprocedure Evaluation (Signed)
Anesthesia Evaluation  Patient identified by MRN, date of birth, ID band Patient awake    Reviewed: Allergy & Precautions, NPO status , Patient's Chart, lab work & pertinent test results  Airway Mallampati: II  TM Distance: >3 FB Neck ROM: Full    Dental no notable dental hx.    Pulmonary neg pulmonary ROS,    Pulmonary exam normal breath sounds clear to auscultation       Cardiovascular negative cardio ROS Normal cardiovascular exam Rhythm:Regular Rate:Normal     Neuro/Psych negative neurological ROS  negative psych ROS   GI/Hepatic negative GI ROS, Neg liver ROS,   Endo/Other  Morbid obesity  Renal/GU negative Renal ROS  negative genitourinary   Musculoskeletal negative musculoskeletal ROS (+)   Abdominal   Peds negative pediatric ROS (+)  Hematology negative hematology ROS (+)   Anesthesia Other Findings   Reproductive/Obstetrics negative OB ROS                             Anesthesia Physical Anesthesia Plan  ASA: III  Anesthesia Plan: General   Post-op Pain Management:    Induction: Intravenous  PONV Risk Score and Plan: 4 or greater and Ondansetron and Treatment may vary due to age or medical condition  Airway Management Planned: LMA and Oral ETT  Additional Equipment:   Intra-op Plan:   Post-operative Plan: Extubation in OR  Informed Consent: I have reviewed the patients History and Physical, chart, labs and discussed the procedure including the risks, benefits and alternatives for the proposed anesthesia with the patient or authorized representative who has indicated his/her understanding and acceptance.   Dental advisory given  Plan Discussed with: CRNA  Anesthesia Plan Comments:         Anesthesia Quick Evaluation

## 2018-08-13 NOTE — Progress Notes (Signed)
Patient ID: Shannon Francis, female   DOB: [birth year], 27 y.o.   MRN: [id number] Patient seen and examined. Consent witnessed and signed. No changes noted. Update completed.

## 2018-08-13 NOTE — Transfer of Care (Signed)
Immediate Anesthesia Transfer of Care Note  Patient: Shannon Francis  Procedure(s) Performed: DILATATION & CURETTAGE/HYSTEROSCOPY WITH MYOSURE (N/A )  Patient Location: PACU  Anesthesia Type:General  Level of Consciousness: awake, alert  and oriented  Airway & Oxygen Therapy: Patient Spontanous Breathing and Patient connected to face mask oxygen  Post-op Assessment: Report given to RN and Post -op Vital signs reviewed and stable  Post vital signs: Reviewed and stable  Last Vitals:  Vitals Value Taken Time  BP 141/82 08/13/2018  3:05 PM  Temp    Pulse 87 08/13/2018  3:10 PM  Resp 27 08/13/2018  3:10 PM  SpO2 100 % 08/13/2018  3:10 PM  Vitals shown include unvalidated device data.  Last Pain:  Vitals:   08/13/18 1234  TempSrc: Oral  PainSc: 0-No pain      Patients Stated Pain Goal: 3 (08/13/18 1234)  Complications: No apparent anesthesia complications

## 2018-08-13 NOTE — Op Note (Signed)
NAME: Shannon Francis, MASLIN MEDICAL RECORD [id number] ACCOUNT [id number] DATE OF BIRTH:December 15, 1990 FACILITY: [facility] LOCATION: [facility] PHYSICIAN:Bertha Earwood J. Sendy Pluta, MD  OPERATIVE REPORT  DATE OF PROCEDURE:  08/13/2018  PREOPERATIVE DIAGNOSIS:  Abnormal uterine bleeding with structural lesion.  POSTOPERATIVE DIAGNOSIS:  Multiple endometrial polyps.  PROCEDURE:  Diagnostic hysteroscopy, D and C, MyoSure resection of multiple polypoid fragments.  SURGEON:  Olivia Mackie, MD  ASSISTANT:  None.  ANESTHESIA:  General and local.  ESTIMATED BLOOD LOSS:  Less than 50 mL.  FLUID DEFICIT:  500 mL.  COMPLICATIONS:  None.  DRAINS:  None.  COUNTS:  Correct.  The patient was taken to the recovery in good condition.  BRIEF OPERATIVE NOTE:  After being apprised of the risks of anesthesia, infection, bleeding, injury to surrounding organs, possible need for repair, delayed risks and complications to include bowel and bladder injury, possible need for repair, the  patient was brought to the operating room where she was administered general anesthetic without complications.  Prepped and draped in usual sterile fashion, catheterized the bladder until it was empty.  Exam under anesthesia revealed a bulky anteflexed  uterus and no adnexal masses.  Dilute Pitressin solution placed 3 and 9 o'clock, 18 mL total.  Standard paracervical block, 20 mL total.  Using 0.25% Marcaine, cervix easily dilated up to 21 Pratt dilator.  Hysteroscope placed.  Visualization reveals  multiple anterior and posterior wall polypoid areas which appear irregular, extending up towards the tubal ostia on the left and right.  These focal areas are resected using the MyoSure device, which is entered without difficulty.  D and C was performed  using sharp curettage in a 4-quadrant method.  SPECIMENS:  All sent together to pathology.  FLUID DEFICIT:  As noted.  The patient was awakened and transferred to recovery after assuring  good hemostasis and was recovered in good condition.  LN/NUANCE  D:08/13/2018 T:08/13/2018 JOB:[id number]

## 2018-08-14 ENCOUNTER — Encounter (HOSPITAL_COMMUNITY): Payer: Self-pay | Admitting: Obstetrics and Gynecology

## 2018-09-10 DIAGNOSIS — N939 Abnormal uterine and vaginal bleeding, unspecified: Secondary | ICD-10-CM | POA: Diagnosis not present

## 2018-10-23 DIAGNOSIS — Y939 Activity, unspecified: Secondary | ICD-10-CM | POA: Insufficient documentation

## 2018-10-23 DIAGNOSIS — Y929 Unspecified place or not applicable: Secondary | ICD-10-CM | POA: Diagnosis not present

## 2018-10-23 DIAGNOSIS — S8992XA Unspecified injury of left lower leg, initial encounter: Secondary | ICD-10-CM | POA: Diagnosis not present

## 2018-10-23 DIAGNOSIS — Y999 Unspecified external cause status: Secondary | ICD-10-CM | POA: Insufficient documentation

## 2018-10-23 DIAGNOSIS — M25462 Effusion, left knee: Secondary | ICD-10-CM | POA: Diagnosis not present

## 2018-10-23 DIAGNOSIS — M25562 Pain in left knee: Secondary | ICD-10-CM | POA: Diagnosis not present

## 2018-10-24 ENCOUNTER — Encounter (HOSPITAL_COMMUNITY): Payer: Self-pay | Admitting: Emergency Medicine

## 2018-10-24 ENCOUNTER — Other Ambulatory Visit: Payer: Self-pay

## 2018-10-24 ENCOUNTER — Emergency Department (HOSPITAL_COMMUNITY)
Admission: EM | Admit: 2018-10-24 | Discharge: 2018-10-24 | Disposition: A | Discharge status: Home / Self Care | Payer: BLUE CROSS/BLUE SHIELD | Attending: Emergency Medicine | Admitting: Emergency Medicine

## 2018-10-24 ENCOUNTER — Emergency Department (HOSPITAL_COMMUNITY): Payer: BLUE CROSS/BLUE SHIELD

## 2018-10-24 DIAGNOSIS — S8992XA Unspecified injury of left lower leg, initial encounter: Secondary | ICD-10-CM

## 2018-10-24 DIAGNOSIS — M25462 Effusion, left knee: Secondary | ICD-10-CM | POA: Diagnosis not present

## 2018-10-24 MED ORDER — NAPROXEN 500 MG PO TABS
ORAL_TABLET | ORAL | Status: AC
  Filled 2018-10-24: qty 1
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
## 2018-10-24 NOTE — ED Notes (Signed)
Bed: WA07 Expected date:  Expected time:  Means of arrival:  Comments: 

## 2018-10-24 NOTE — ED Provider Notes (Signed)
See Epic downtime documentation.   Paula LibraMolpus, Jackee Glasner, MD 10/24/18 [phone]
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
## 2018-11-19 DIAGNOSIS — N915 Oligomenorrhea, unspecified: Secondary | ICD-10-CM | POA: Diagnosis not present

## 2018-12-06 DIAGNOSIS — Z3202 Encounter for pregnancy test, result negative: Secondary | ICD-10-CM | POA: Diagnosis not present

## 2019-01-06 ENCOUNTER — Inpatient Hospital Stay (HOSPITAL_COMMUNITY): Payer: BLUE CROSS/BLUE SHIELD

## 2019-01-06 ENCOUNTER — Encounter (HOSPITAL_COMMUNITY): Payer: Self-pay | Admitting: *Deleted

## 2019-01-06 ENCOUNTER — Inpatient Hospital Stay (HOSPITAL_COMMUNITY)
Admission: AD | Admit: 2019-01-06 | Discharge: 2019-01-06 | Disposition: A | Discharge status: Home / Self Care | Payer: BLUE CROSS/BLUE SHIELD | Source: Ambulatory Visit | Attending: Obstetrics and Gynecology | Admitting: Obstetrics and Gynecology

## 2019-01-06 DIAGNOSIS — O200 Threatened abortion: Secondary | ICD-10-CM | POA: Diagnosis not present

## 2019-01-06 DIAGNOSIS — O209 Hemorrhage in early pregnancy, unspecified: Secondary | ICD-10-CM | POA: Diagnosis not present

## 2019-01-06 DIAGNOSIS — O208 Other hemorrhage in early pregnancy: Secondary | ICD-10-CM

## 2019-01-06 DIAGNOSIS — Z3A01 Less than 8 weeks gestation of pregnancy: Secondary | ICD-10-CM | POA: Diagnosis not present

## 2019-01-06 LAB — URINALYSIS, ROUTINE W REFLEX MICROSCOPIC
BILIRUBIN URINE: NEGATIVE
Glucose, UA: NEGATIVE mg/dL
KETONES UR: NEGATIVE mg/dL
LEUKOCYTES UA: NEGATIVE
NITRITE: NEGATIVE
PH: 5 (ref 5.0–8.0)
Protein, ur: NEGATIVE mg/dL
SPECIFIC GRAVITY, URINE: 1.029 (ref 1.005–1.030)

## 2019-01-06 LAB — POCT PREGNANCY, URINE: Preg Test, Ur: NEGATIVE

## 2019-01-06 LAB — CBC
HCT: 32.8 % — ABNORMAL LOW (ref 36.0–46.0)
Hemoglobin: 10.3 g/dL — ABNORMAL LOW (ref 12.0–15.0)
MCH: 24.4 pg — ABNORMAL LOW (ref 26.0–34.0)
MCHC: 31.4 g/dL (ref 30.0–36.0)
MCV: 77.7 fL — ABNORMAL LOW (ref 80.0–100.0)
Platelets: 317 10*3/uL (ref 150–400)
RBC: 4.22 MIL/uL (ref 3.87–5.11)
RDW: 16.9 % — ABNORMAL HIGH (ref 11.5–15.5)
WBC: 9.3 10*3/uL (ref 4.0–10.5)
nRBC: 0 % (ref 0.0–0.2)

## 2019-01-06 LAB — ABO/RH: ABO/RH(D): B POS

## 2019-01-06 LAB — HCG, QUANTITATIVE, PREGNANCY: hCG, Beta Chain, Quant, S: 32 m[IU]/mL — ABNORMAL HIGH (ref <5)

## 2019-01-06 NOTE — Discharge Instructions (Signed)
Threatened Miscarriage    A threatened miscarriage is when you have bleeding from your vagina during the first 20 weeks of pregnancy but the pregnancy does not end. Your doctor will do tests to make sure you are still pregnant. The cause of the bleeding may not be known. This condition does not mean your pregnancy will end, but it does increase the risk that it will end (complete miscarriage).  Follow these instructions at home:  · Get plenty of rest.  · If you have bleeding in your vagina, do not have sex or use tampons.  · Do not douche.  · Do not smoke or use drugs.  · Do not drink alcohol.  · Avoid caffeine.  · Keep all follow-up prenatal visits as told by your doctor. This is important.  Contact a doctor if:  · You have light bleeding from your vagina.  · You have belly pain or cramping.  · You have a fever.  Get help right away if:  · You have heavy bleeding from your vagina.  · You have clots of blood coming from your vagina.  · You pass tissue from your vagina.  · You have a gush of fluid from your vagina.  · You are leaking fluid from your vagina.  · You have very bad pain or cramps in your low back or belly.  · You have fever, chills, and very bad belly pain.  Summary  · A threatened miscarriage is when you have bleeding from your vagina during the first 20 weeks of pregnancy but the pregnancy does not end.  · This condition does not mean your pregnancy will end, but it does increase the risk that it will end (complete miscarriage).  · Get plenty of rest. If you have bleeding in your vagina, do not have sex or use tampons.  · Keep all follow-up prenatal visits as told by your doctor. This is important.  This information is not intended to replace advice given to you by your health care provider. Make sure you discuss any questions you have with your health care provider.  Document Released: 11/09/2008 Document Revised: 02/23/2017 Document Reviewed: 02/23/2017  Elsevier Interactive Patient Education © 2019  Elsevier Inc[employer]

## 2019-01-06 NOTE — MAU Note (Signed)
Pt reports positive home preg test today, spotting.

## 2019-01-06 NOTE — MAU Provider Note (Signed)
History     CSN: [id number]  Arrival date and time: 01/06/19 0258   None     Chief Complaint  Patient presents with  . Possible Pregnancy  . Vaginal Bleeding   HPI   Patient presenting for vaginal spotting. Reports spotting started this evening. Seeing some bleeding while wiping. She had a positive HPT this morning. LMP 12/29. Denies abdominal cramping and pain. No fevers, vomiting, vaginal discharge, dysuria, pelvic pain.   OB History    Gravida  1   Para      Term      Preterm      AB  1   Living  0     SAB      TAB  1   Ectopic      Multiple      Live Births              Past Medical History:  Diagnosis Date  . Headache   . Medical history non-contributory   . Obesity     Past Surgical History:  Procedure Laterality Date  . ANTERIOR CRUCIATE LIGAMENT REPAIR     left  . DILATATION & CURETTAGE/HYSTEROSCOPY WITH MYOSURE N/A 08/13/2018   Procedure: DILATATION & CURETTAGE/HYSTEROSCOPY WITH MYOSURE;  Surgeon: Olivia Mackie, MD;  Location: [facility];  Service: Gynecology;  Laterality: N/A;  . INCISION AND DRAINAGE     chest  . KNEE SURGERY      Family History  Problem Relation Age of Onset  . Hypertension Mother   . Cancer Sister   . Diabetes Other     Social History   Tobacco Use  . Smoking status: Never Smoker  . Smokeless tobacco: Never Used  Substance Use Topics  . Alcohol use: No  . Drug use: No    Allergies:  Allergies  Allergen Reactions  . Hydrocodone Nausea And Vomiting and Anaphylaxis    Medications Prior to Admission  Medication Sig Dispense Refill Last Dose  . traMADol (ULTRAM) 50 MG tablet Take 1-2 tablets (50-100 mg total) by mouth every 6 (six) hours as needed. 20 tablet 0     Review of Systems  Constitutional: Negative for appetite change, chills and fever.  Respiratory: Negative for shortness of breath.   Cardiovascular: Negative for chest pain.  Gastrointestinal: Negative for abdominal pain, diarrhea, nausea  and vomiting.  Genitourinary: Positive for vaginal bleeding. Negative for dysuria, pelvic pain and vaginal discharge.  Musculoskeletal: Negative for back pain.  Neurological: Negative for dizziness and light-headedness.   Physical Exam   Blood pressure 137/86, pulse 89, temperature 98.6 F (37 C), temperature source Oral, resp. rate 16, height 5\' 5"  (1.651 m), weight (!) 161.9 kg, last menstrual period 12/08/2018, SpO2 98 %.  Physical Exam  Constitutional: She is oriented to person, place, and time. She appears well-developed and well-nourished. No distress.  HENT:  Head: Normocephalic and atraumatic.  Eyes: Conjunctivae and EOM are normal.  Neck: Normal range of motion. Neck supple.  Cardiovascular: Normal rate, regular rhythm and normal heart sounds.  Respiratory: Effort normal and breath sounds normal. No respiratory distress.  GI: Soft. Bowel sounds are normal. She exhibits no distension. There is no abdominal tenderness. There is no rebound.  Musculoskeletal: Normal range of motion.        General: No edema.  Neurological: She is alert and oriented to person, place, and time.  Skin: Skin is warm and dry. She is not diaphoretic.  Psychiatric: She has a normal mood and affect. Her  behavior is normal.    MAU Course  Procedures  MDM UPT negative in MAU. Given report of positive HPT, b-hcg obtained and found to be minimally elevated at 32. Will obtain transvaginal ultrasound to rule out ectopic pregnancy. Abo/Rh obtained and B+, not candidate for Rhogam.   US Ob Less Than 14 Weeks With Ob Transvaginal  Result Date: 01/06/2019 CLINICAL DATA:  Vaginal bleeding. Estimated gestational age of [redacted] weeks, 1 day by LMP. EXAM: OBSTETRIC <14 WK [place] AND TRANSVAGINAL OB US TECHNIQUE: Both transabdominal and transvaginal ultrasound examinations were performed for complete evaluation of the gestation as well as the maternal uterus, adnexal regions, and pelvic cul-de-sac. Transvaginal technique was  performed to assess early pregnancy. COMPARISON:  Pelvic ultrasound dated August 09, 2012. FINDINGS: Intrauterine gestational sac: None. Yolk sac:  Not Visualized. Embryo:  Not Visualized. Maternal uterus/adnexae: Unremarkable. IMPRESSION: 1.  No IUP is visualized. By definition, in the setting of a positive pregnancy test, this reflects a pregnancy of unknown location. Differential considerations include early normal IUP, abnormal IUP/missed abortion, or nonvisualized ectopic pregnancy. Serial beta HCG is suggested. Consider repeat pelvic ultrasound in 14 days. Electronically Signed   By: Obie Dredge M.D.   On: 01/06/2019 21:14    Assessment and Plan   1. Threatened abortion   2. Vaginal bleeding affecting early pregnancy    No IUP visualized. Differential remains normal IUP vs. Abnormal IUP/missed abortion vs. nonvisualized ectopic. Will obtain bhcg on 1/30, lab visit scheduled. Strict return precautions discussed.   De Hollingshead 01/06/2019, 8:40 PM

## 2019-01-08 ENCOUNTER — Inpatient Hospital Stay (HOSPITAL_COMMUNITY)
Admission: AD | Admit: 2019-01-08 | Discharge: 2019-01-08 | Disposition: A | Discharge status: Home / Self Care | Payer: BLUE CROSS/BLUE SHIELD | Attending: Obstetrics | Admitting: Obstetrics

## 2019-01-08 DIAGNOSIS — O209 Hemorrhage in early pregnancy, unspecified: Secondary | ICD-10-CM | POA: Diagnosis not present

## 2019-01-08 DIAGNOSIS — Z3A01 Less than 8 weeks gestation of pregnancy: Secondary | ICD-10-CM | POA: Diagnosis not present

## 2019-01-08 DIAGNOSIS — O3680X0 Pregnancy with inconclusive fetal viability, not applicable or unspecified: Secondary | ICD-10-CM

## 2019-01-08 LAB — HCG, QUANTITATIVE, PREGNANCY: hCG, Beta Chain, Quant, S: 74 m[IU]/mL — ABNORMAL HIGH (ref <5)

## 2019-01-08 NOTE — Discharge Instructions (Signed)
Vaginal Bleeding During Pregnancy, First Trimester ° °A small amount of bleeding from the vagina (spotting) is relatively common during early pregnancy. It usually stops on its own. Various things may cause bleeding or spotting during early pregnancy. Some bleeding may be related to the pregnancy, and some may not. In many cases, the bleeding is normal and is not a problem. However, bleeding can also be a sign of something serious. Be sure to tell your health care provider about any vaginal bleeding right away. °Some possible causes of vaginal bleeding during the first trimester include: °· Infection or inflammation of the cervix. °· Growths (polyps) on the cervix. °· Miscarriage or threatened miscarriage. °· Pregnancy tissue developing outside of the uterus (ectopic pregnancy). °· A mass of tissue developing in the uterus due to an egg being fertilized incorrectly (molar pregnancy). °Follow these instructions at home: °Activity °· Follow instructions from your health care provider about limiting your activity. Ask what activities are safe for you. °· If needed, make plans for someone to help with your regular activities. °· Do not have sex or orgasms until your health care provider says that this is safe. °General instructions °· Take over-the-counter and prescription medicines only as told by your health care provider. °· Pay attention to any changes in your symptoms. °· Do not use tampons or douche. °· Write down how many pads you use each day, how often you change pads, and how soaked (saturated) they are. °· If you pass any tissue from your vagina, save the tissue so you can show it to your health care provider. °· Keep all follow-up visits as told by your health care provider. This is important. °Contact a health care provider if: °· You have vaginal bleeding during any part of your pregnancy. °· You have cramps or labor pains. °· You have a fever. °Get help right away if: °· You have severe cramps in your  back or abdomen. °· You pass large clots or a large amount of tissue from your vagina. °· Your bleeding increases. °· You feel light-headed or weak, or you faint. °· You have chills. °· You are leaking fluid or have a gush of fluid from your vagina. °Summary °· A small amount of bleeding (spotting) from the vagina is relatively common during early pregnancy. °· Various things may cause bleeding or spotting in early pregnancy. °· Be sure to tell your health care provider about any vaginal bleeding right away. °This information is not intended to replace advice given to you by your health care provider. Make sure you discuss any questions you have with your health care provider. °Document Released: 09/06/2005 Document Revised: 03/01/2017 Document Reviewed: 03/01/2017 °Elsevier Interactive Patient Education © 2019 Elsevier Inc. ° °

## 2019-01-08 NOTE — MAU Provider Note (Signed)
Chief Complaint: Vaginal Bleeding   First Provider Initiated Contact with Patient 01/08/19 1648        SUBJECTIVE HPI: Shannon Francis is a 28 y.o. G2P0010 at [redacted]w[redacted]d by LMP who presents to maternity admissions reporting increased bleeding since two days ago.  Was seen then for spotting and HCG level was only 32.  Was due to repeat tomorrow but bleeding got heavier today. Not saturating pads.  Has appt with Dr Billy Coast Friday. . She denies vaginal itching/burning, urinary symptoms, h/a, dizziness, n/v, or fever/chills.     MAU note from 01/06/2019 Patient presenting for vaginal spotting. Reports spotting started this evening. Seeing some bleeding while wiping. She had a positive HPT this morning. LMP 12/29. Denies abdominal cramping and pain. No fevers, vomiting, vaginal discharge, dysuria, pelvic pain.   UPT negative in MAU. Given report of positive HPT, b-hcg obtained and found to be minimally elevated at 32. Will obtain transvaginal ultrasound to rule out ectopic pregnancy. Abo/Rh obtained and B+, not candidate for Rhogam.  Past Medical History:  Diagnosis Date  . Headache   . Medical history non-contributory   . Obesity    Past Surgical History:  Procedure Laterality Date  . ANTERIOR CRUCIATE LIGAMENT REPAIR     left  . DILATATION & CURETTAGE/HYSTEROSCOPY WITH MYOSURE N/A 08/13/2018   Procedure: DILATATION & CURETTAGE/HYSTEROSCOPY WITH MYOSURE;  Surgeon: Olivia Mackie, MD;  Location: [facility];  Service: Gynecology;  Laterality: N/A;  . INCISION AND DRAINAGE     chest  . KNEE SURGERY     Social History   Socioeconomic History  . Marital status: Married    Spouse name: Not on file  . Number of children: Not on file  . Years of education: Not on file  . Highest education level: Not on file  Occupational History  . Not on file  Social Needs  . Financial resource strain: Not on file  . Food insecurity:    Worry: Not on file    Inability: Not on file  . Transportation needs:   Medical: Not on file    Non-medical: Not on file  Tobacco Use  . Smoking status: Never Smoker  . Smokeless tobacco: Never Used  Substance and Sexual Activity  . Alcohol use: No  . Drug use: No  . Sexual activity: Yes    Birth control/protection: None  Lifestyle  . Physical activity:    Days per week: Not on file    Minutes per session: Not on file  . Stress: Not on file  Relationships  . Social connections:    Talks on phone: Not on file    Gets together: Not on file    Attends religious service: Not on file    Active member of club or organization: Not on file    Attends meetings of clubs or organizations: Not on file    Relationship status: Not on file  . Intimate partner violence:    Fear of current or ex partner: Not on file    Emotionally abused: Not on file    Physically abused: Not on file    Forced sexual activity: Not on file  Other Topics Concern  . Not on file  Social History Narrative  . Not on file   No current facility-administered medications on file prior to encounter.    Current Outpatient Medications on File Prior to Encounter  Medication Sig Dispense Refill  . traMADol (ULTRAM) 50 MG tablet Take 1-2 tablets (50-100 mg total) by mouth  every 6 (six) hours as needed. 20 tablet 0   Allergies  Allergen Reactions  . Hydrocodone Nausea And Vomiting and Anaphylaxis    I have reviewed patient's Past Medical Hx, Surgical Hx, Family Hx, Social Hx, medications and allergies.   ROS:  Review of Systems  Constitutional: Negative for chills and fever.  Respiratory: Negative for shortness of breath.   Genitourinary: Positive for vaginal bleeding.  Neurological: Negative for dizziness.   Review of Systems  Other systems negative   Physical Exam  Physical Exam Patient Vitals for the past 24 hrs:  BP Temp Temp src Pulse Resp Weight  01/08/19 1441 125/69 98.3 F (36.8 C) Oral 76 18 (!) 161.9 kg   Constitutional: Well-developed, well-nourished female in no  acute distress.  Cardiovascular: normal rate Respiratory: normal effort GI: Abd soft, non-tender. Pos BS x 4 MS: Extremities nontender, no edema, normal ROM Neurologic: Alert and oriented x 4.  GU: Neg CVAT.  PELVIC EXAM: deferred  LAB RESULTS Results for orders placed or performed during the hospital encounter of 01/08/19 (from the past 24 hour(s))  hCG, quantitative, pregnancy     Status: Abnormal   Collection Time: 01/08/19  3:59 PM  Result Value Ref Range   hCG, Beta Chain, Quant, S 74 (H) <5 mIU/mL   Last HCG was 32  --/--/B POS Performed at [facility], [address], [place], [place] [address]  [phone])  IMAGING US Ob Less Than 14 Weeks With Ob Transvaginal  Result Date: 01/06/2019 CLINICAL DATA:  Vaginal bleeding. Estimated gestational age of [redacted] weeks, 1 day by LMP. EXAM: OBSTETRIC <14 WK [place] AND TRANSVAGINAL OB US TECHNIQUE: Both transabdominal and transvaginal ultrasound examinations were performed for complete evaluation of the gestation as well as the maternal uterus, adnexal regions, and pelvic cul-de-sac. Transvaginal technique was performed to assess early pregnancy. COMPARISON:  Pelvic ultrasound dated August 09, 2012. FINDINGS: Intrauterine gestational sac: None. Yolk sac:  Not Visualized. Embryo:  Not Visualized. Maternal uterus/adnexae: Unremarkable. IMPRESSION: 1.  No IUP is visualized. By definition, in the setting of a positive pregnancy test, this reflects a pregnancy of unknown location. Differential considerations include early normal IUP, abnormal IUP/missed abortion, or nonvisualized ectopic pregnancy. Serial beta HCG is suggested. Consider repeat pelvic ultrasound in 14 days. Electronically Signed   By: Obie Dredge M.D.   On: 01/06/2019 21:14    MAU Management/MDM: Ordered followup Quant HCG  This bleeding/pain can represent a normal pregnancy with bleeding, spontaneous abortion or even an ectopic which can be life-threatening.  The process as  listed above helps to determine which of these is present.  Reviewed findings with doubling of HCG. Discussed we still cannot know reason for bleeding or prognosis for pregnancy Discussed we cannot rule out ectopic pregnancy just yet Encouraged to come back for signs of worsening or severe pain  ASSESSMENT Pregnancy at [redacted]w[redacted]d Bleeding in the first trimester PRegnancy or unknown location  PLAN Discharge home Plan to repeat HCG level in 48 hours in Dr Jorene Minors office Will repeat  Ultrasound in about 7-10 days if HCG levels double appropriately  Ectopic precautions  Pt stable at time of discharge. Encouraged to return here or to other Urgent Care/ED if she develops worsening of symptoms, increase in pain, fever, or other concerning symptoms.    Wynelle Bourgeois CNM, MSN Certified Nurse-Midwife 01/08/2019  4:48 PM

## 2019-01-08 NOTE — MAU Note (Signed)
Pt scheduled for F/U BHCG tomorrow, bleeding became heavier today, passing stringy clots.  Denies pain.

## 2019-01-09 ENCOUNTER — Other Ambulatory Visit: Payer: BLUE CROSS/BLUE SHIELD

## 2019-01-10 ENCOUNTER — Other Ambulatory Visit: Payer: BLUE CROSS/BLUE SHIELD

## 2019-01-10 DIAGNOSIS — Z3A01 Less than 8 weeks gestation of pregnancy: Secondary | ICD-10-CM | POA: Diagnosis not present

## 2019-01-10 DIAGNOSIS — O26851 Spotting complicating pregnancy, first trimester: Secondary | ICD-10-CM | POA: Diagnosis not present

## 2019-01-14 DIAGNOSIS — Z3A01 Less than 8 weeks gestation of pregnancy: Secondary | ICD-10-CM | POA: Diagnosis not present

## 2019-01-14 DIAGNOSIS — O26859 Spotting complicating pregnancy, unspecified trimester: Secondary | ICD-10-CM | POA: Diagnosis not present

## 2019-01-16 DIAGNOSIS — O26859 Spotting complicating pregnancy, unspecified trimester: Secondary | ICD-10-CM | POA: Diagnosis not present

## 2019-01-17 ENCOUNTER — Inpatient Hospital Stay (HOSPITAL_COMMUNITY): Payer: BLUE CROSS/BLUE SHIELD

## 2019-01-17 ENCOUNTER — Inpatient Hospital Stay (HOSPITAL_COMMUNITY)
Admission: AD | Admit: 2019-01-17 | Discharge: 2019-01-18 | Disposition: A | Discharge status: Home / Self Care | Payer: BLUE CROSS/BLUE SHIELD | Attending: Obstetrics & Gynecology | Admitting: Obstetrics & Gynecology

## 2019-01-17 ENCOUNTER — Encounter (HOSPITAL_COMMUNITY): Payer: Self-pay | Admitting: *Deleted

## 2019-01-17 DIAGNOSIS — O3680X0 Pregnancy with inconclusive fetal viability, not applicable or unspecified: Secondary | ICD-10-CM | POA: Diagnosis not present

## 2019-01-17 DIAGNOSIS — O99211 Obesity complicating pregnancy, first trimester: Secondary | ICD-10-CM | POA: Diagnosis not present

## 2019-01-17 DIAGNOSIS — O26891 Other specified pregnancy related conditions, first trimester: Secondary | ICD-10-CM

## 2019-01-17 DIAGNOSIS — R1030 Lower abdominal pain, unspecified: Secondary | ICD-10-CM | POA: Diagnosis not present

## 2019-01-17 DIAGNOSIS — Z3A01 Less than 8 weeks gestation of pregnancy: Secondary | ICD-10-CM | POA: Insufficient documentation

## 2019-01-17 DIAGNOSIS — E669 Obesity, unspecified: Secondary | ICD-10-CM | POA: Insufficient documentation

## 2019-01-17 DIAGNOSIS — R109 Unspecified abdominal pain: Secondary | ICD-10-CM

## 2019-01-17 LAB — URINALYSIS, ROUTINE W REFLEX MICROSCOPIC
BILIRUBIN URINE: NEGATIVE
Glucose, UA: NEGATIVE mg/dL
Hgb urine dipstick: NEGATIVE
Ketones, ur: NEGATIVE mg/dL
Leukocytes, UA: NEGATIVE
Nitrite: NEGATIVE
Protein, ur: NEGATIVE mg/dL
Specific Gravity, Urine: 1.015 (ref 1.005–1.030)
pH: 7 (ref 5.0–8.0)

## 2019-01-17 LAB — WET PREP, GENITAL
Sperm: NONE SEEN
Trich, Wet Prep: NONE SEEN
Yeast Wet Prep HPF POC: NONE SEEN

## 2019-01-17 NOTE — MAU Note (Signed)
Pt reports she started having abd cramping about 1 hr ago. Denies any vag bleeding or discharge.

## 2019-01-17 NOTE — MAU Provider Note (Signed)
Chief Complaint: Abdominal Pain   First Provider Initiated Contact with Patient 01/17/19 2203      SUBJECTIVE HPI: Shannon Francis is a 28 y.o. G2P0010 at 5971w5d by LMP who presents to maternity admissions reporting onset of cramping lower abdominal pain after bowel movement today. The pain has improved over time but is still present. She has not tried any treatments. There are no other symptoms. She had hcg in MAU on 1/27 of 32 and on 1/29 of 74. US on 1/27 saw no IUP or ectopic.    HPI  Past Medical History:  Diagnosis Date  . Headache   . Medical history non-contributory   . Obesity    Past Surgical History:  Procedure Laterality Date  . ANTERIOR CRUCIATE LIGAMENT REPAIR     left  . DILATATION & CURETTAGE/HYSTEROSCOPY WITH MYOSURE N/A 08/13/2018   Procedure: DILATATION & CURETTAGE/HYSTEROSCOPY WITH MYOSURE;  Surgeon: Olivia Mackieaavon, Richard, MD;  Location: [facility];  Service: Gynecology;  Laterality: N/A;  . INCISION AND DRAINAGE     chest  . KNEE SURGERY     Social History   Socioeconomic History  . Marital status: Married    Spouse name: Not on file  . Number of children: Not on file  . Years of education: Not on file  . Highest education level: Not on file  Occupational History  . Not on file  Social Needs  . Financial resource strain: Not on file  . Food insecurity:    Worry: Not on file    Inability: Not on file  . Transportation needs:    Medical: Not on file    Non-medical: Not on file  Tobacco Use  . Smoking status: Never Smoker  . Smokeless tobacco: Never Used  Substance and Sexual Activity  . Alcohol use: No  . Drug use: No  . Sexual activity: Yes    Birth control/protection: None  Lifestyle  . Physical activity:    Days per week: Not on file    Minutes per session: Not on file  . Stress: Not on file  Relationships  . Social connections:    Talks on phone: Not on file    Gets together: Not on file    Attends religious service: Not on file    Active member  of club or organization: Not on file    Attends meetings of clubs or organizations: Not on file    Relationship status: Not on file  . Intimate partner violence:    Fear of current or ex partner: Not on file    Emotionally abused: Not on file    Physically abused: Not on file    Forced sexual activity: Not on file  Other Topics Concern  . Not on file  Social History Narrative  . Not on file   No current facility-administered medications on file prior to encounter.    No current outpatient medications on file prior to encounter.   Allergies  Allergen Reactions  . Hydrocodone Nausea And Vomiting and Anaphylaxis    ROS:  Review of Systems  Constitutional: Negative for chills, fatigue and fever.  Respiratory: Negative for shortness of breath.   Cardiovascular: Negative for chest pain.  Gastrointestinal: Positive for abdominal pain. Negative for nausea and vomiting.  Genitourinary: Positive for pelvic pain. Negative for difficulty urinating, dysuria, flank pain, vaginal bleeding, vaginal discharge and vaginal pain.  Neurological: Negative for dizziness and headaches.  Psychiatric/Behavioral: Negative.      I have reviewed patient's Past Medical  Hx, Surgical Hx, Family Hx, Social Hx, medications and allergies.   Physical Exam   Patient Vitals for the past 24 hrs:  BP Temp Pulse Resp Height Weight  01/17/19 2134 138/71 - 80 - 5\' 5"  (1.651 m) (!) 165.6 kg  01/17/19 2133 - 98.6 F (37 C) - 18 - -   Constitutional: Well-developed, well-nourished female in no acute distress.  Cardiovascular: normal rate Respiratory: normal effort GI: Abd soft, non-tender. Pos BS x 4 MS: Extremities nontender, no edema, normal ROM Neurologic: Alert and oriented x 4.  GU: Neg CVAT.  PELVIC EXAM: Cervix pink, visually closed, without lesion, scant white creamy discharge, vaginal walls and external genitalia normal Bimanual exam: Cervix 0/long/high, firm, anterior, neg CMT, uterus nontender,  nonenlarged, adnexa without tenderness, enlargement, or mass   LAB RESULTS Results for orders placed or performed during the hospital encounter of 01/17/19 (from the past 24 hour(s))  Wet prep, genital     Status: Abnormal   Collection Time: 01/17/19 10:21 PM  Result Value Ref Range   Yeast Wet Prep HPF POC NONE SEEN NONE SEEN   Trich, Wet Prep NONE SEEN NONE SEEN   Clue Cells Wet Prep HPF POC PRESENT (A) NONE SEEN   WBC, Wet Prep HPF POC FEW (A) NONE SEEN   Sperm NONE SEEN   Urinalysis, Routine w reflex microscopic     Status: None   Collection Time: 01/17/19 10:22 PM  Result Value Ref Range   Color, Urine YELLOW YELLOW   APPearance CLEAR CLEAR   Specific Gravity, Urine 1.015 1.005 - 1.030   pH 7.0 5.0 - 8.0   Glucose, UA NEGATIVE NEGATIVE mg/dL   Hgb urine dipstick NEGATIVE NEGATIVE   Bilirubin Urine NEGATIVE NEGATIVE   Ketones, ur NEGATIVE NEGATIVE mg/dL   Protein, ur NEGATIVE NEGATIVE mg/dL   Nitrite NEGATIVE NEGATIVE   Leukocytes, UA NEGATIVE NEGATIVE    --/--/B POS Performed at [facility], [address], [place], [place] [address]  [phone])  IMAGING US Ob Transvaginal  Result Date: 01/17/2019 CLINICAL DATA:  28 year old female with abdominal pain in the 1st trimester of pregnancy. No IUP on prior study. Estimated gestational age by LMP 5 weeks 5 days. EXAM: TRANSVAGINAL OB ULTRASOUND TECHNIQUE: Transvaginal ultrasound was performed for complete evaluation of the gestation as well as the maternal uterus, adnexal regions, and pelvic cul-de-sac. COMPARISON:  Ob ultrasound 01/06/2019. FINDINGS: Intrauterine gestational sac: Single Yolk sac:  Not visible Embryo:  Not visible Cardiac Activity: Not applicable MSD: 3.8 mm   5 w   0 d Subchorionic hemorrhage:  None visualized. Maternal uterus/adnexae: Both ovaries appear normal. The right ovary is 2.6 x 2.7 x 2.5 centimeters. The left is 3.6 x 1.7 x 2.1 centimeters. There is trace simple appearing pelvic free fluid (image  25). IMPRESSION: 1. Probable early intrauterine gestational sac, but no yolk sac, fetal pole, or cardiac activity yet visualized. Recommend follow-up quantitative B-HCG levels and follow-up US in 14 days to confirm and assess viability. This recommendation follows SRU consensus guidelines: Diagnostic Criteria for Nonviable Pregnancy Early in the First Trimester. Malva Limes Med 2013; 650:3546-56. 2. Ovaries appear normal. Trace pelvic free fluid appears physiologic. Electronically Signed   By: Odessa Fleming M.D.   On: 01/17/2019 23:13   US Ob Less Than 14 Weeks With Ob Transvaginal  Result Date: 01/06/2019 CLINICAL DATA:  Vaginal bleeding. Estimated gestational age of [redacted] weeks, 1 day by LMP. EXAM: OBSTETRIC <14 WK [place] AND TRANSVAGINAL OB US TECHNIQUE:  Both transabdominal and transvaginal ultrasound examinations were performed for complete evaluation of the gestation as well as the maternal uterus, adnexal regions, and pelvic cul-de-sac. Transvaginal technique was performed to assess early pregnancy. COMPARISON:  Pelvic ultrasound dated August 09, 2012. FINDINGS: Intrauterine gestational sac: None. Yolk sac:  Not Visualized. Embryo:  Not Visualized. Maternal uterus/adnexae: Unremarkable. IMPRESSION: 1.  No IUP is visualized. By definition, in the setting of a positive pregnancy test, this reflects a pregnancy of unknown location. Differential considerations include early normal IUP, abnormal IUP/missed abortion, or nonvisualized ectopic pregnancy. Serial beta HCG is suggested. Consider repeat pelvic ultrasound in 14 days. Electronically Signed   By: Obie DredgeWilliam T Derry M.D.   On: 01/06/2019 21:14    MAU Management/MDM: Orders Placed This Encounter  Procedures  . Wet prep, genital  . US OB Transvaginal  . Urinalysis, Routine w reflex microscopic  . hCG, quantitative, pregnancy  . CBC  . Discharge patient    No orders of the defined types were placed in this encounter.   UA wnl.  US with 5 week GS only, no YS or FP  seen yet.  Pt reports hcg levels were followed in the office after her MAU visits on 1/27 and 1/29 and have risen appropriately, last at 1200 two days ago.  This still constitutes pregnancy of unknown location but with appropriate rise in hcg. Pt has planned f/u US on 2/21.  Clue cells noted but pt without symptoms so does not meet criteria for treatment.  Abdominal pain may be digestive.  [facility]  Initially I recommended hcg but the pt was able to provide detailed hx of hcg in office so I do not think another hcg would be helpful.  If her pain improves, she can keep her scheduled f/u US/visits.  Pt to return to MAU with emergencies.   Pt discharged with strict ectopic/pain precautions.  ASSESSMENT 1. Pregnancy of unknown anatomic location   2. Abdominal pain during pregnancy in first trimester     PLAN Discharge home Allergies as of 01/17/2019      Reactions   Hydrocodone Nausea And Vomiting, Anaphylaxis      Medication List    You have not been prescribed any medications.      Sharen CounterLisa Leftwich-Kirby Certified Nurse-Midwife 01/17/2019  11:55 PM

## 2019-01-20 LAB — GC/CHLAMYDIA PROBE AMP (~~LOC~~) NOT AT ARMC
Chlamydia: NEGATIVE
NEISSERIA GONORRHEA: NEGATIVE

## 2019-01-28 DIAGNOSIS — O209 Hemorrhage in early pregnancy, unspecified: Secondary | ICD-10-CM | POA: Diagnosis not present

## 2019-01-28 DIAGNOSIS — Z3A01 Less than 8 weeks gestation of pregnancy: Secondary | ICD-10-CM | POA: Diagnosis not present

## 2019-02-04 DIAGNOSIS — Z3201 Encounter for pregnancy test, result positive: Secondary | ICD-10-CM | POA: Diagnosis not present

## 2019-02-12 ENCOUNTER — Inpatient Hospital Stay (HOSPITAL_COMMUNITY): Payer: BLUE CROSS/BLUE SHIELD

## 2019-02-12 ENCOUNTER — Other Ambulatory Visit: Payer: Self-pay

## 2019-02-12 ENCOUNTER — Inpatient Hospital Stay (HOSPITAL_COMMUNITY)
Admission: AD | Admit: 2019-02-12 | Discharge: 2019-02-12 | Disposition: A | Discharge status: Home / Self Care | Payer: BLUE CROSS/BLUE SHIELD | Attending: Obstetrics and Gynecology | Admitting: Obstetrics and Gynecology

## 2019-02-12 ENCOUNTER — Encounter (HOSPITAL_COMMUNITY): Payer: Self-pay | Admitting: *Deleted

## 2019-02-12 DIAGNOSIS — O209 Hemorrhage in early pregnancy, unspecified: Secondary | ICD-10-CM

## 2019-02-12 DIAGNOSIS — Z3A08 8 weeks gestation of pregnancy: Secondary | ICD-10-CM | POA: Insufficient documentation

## 2019-02-12 DIAGNOSIS — O039 Complete or unspecified spontaneous abortion without complication: Secondary | ICD-10-CM | POA: Diagnosis not present

## 2019-02-12 DIAGNOSIS — Z3A00 Weeks of gestation of pregnancy not specified: Secondary | ICD-10-CM | POA: Diagnosis not present

## 2019-02-12 DIAGNOSIS — O208 Other hemorrhage in early pregnancy: Secondary | ICD-10-CM | POA: Diagnosis not present

## 2019-02-12 NOTE — MAU Note (Addendum)
Started bleeding again Sun/Mon. Bright red, mainly when she wipes. Is doing progesterone supp. Still cramping, not bad.  Due date changed by office [place]

## 2019-02-12 NOTE — MAU Provider Note (Signed)
Chief Complaint: Vaginal Bleeding and Abdominal Pain   None     SUBJECTIVE HPI: Shannon Francis is a 28 y.o. G2P0010 at [redacted]w[redacted]d who presents to maternity admissions reporting she was seen in the office today and had [place] that did not show the pregnancy. She has been bleeding lightly for >1 week and this is unchanged in amount.  She has mild cramping associated with the bleeding but this is the same since her MAU evaluation on 01/17/19. [place] in the office confirmed IUP with [facility].  There are no other symptoms. She denies any treatments other than progesterone, which was prescribed 1 week ago due to bleeding/threatened miscarriage.       HPI  Past Medical History:  Diagnosis Date  . Headache   . Medical history non-contributory   . Obesity    Past Surgical History:  Procedure Laterality Date  . ANTERIOR CRUCIATE LIGAMENT REPAIR     left  . DILATATION & CURETTAGE/HYSTEROSCOPY WITH MYOSURE N/A 08/13/2018   Procedure: DILATATION & CURETTAGE/HYSTEROSCOPY WITH MYOSURE;  Surgeon: Olivia Mackie, MD;  Location: [facility];  Service: Gynecology;  Laterality: N/A;  . INCISION AND DRAINAGE     chest  . KNEE SURGERY     Social History   Socioeconomic History  . Marital status: Married    Spouse name: Not on file  . Number of children: Not on file  . Years of education: Not on file  . Highest education level: Not on file  Occupational History  . Not on file  Social Needs  . Financial resource strain: Not on file  . Food insecurity:    Worry: Not on file    Inability: Not on file  . Transportation needs:    Medical: Not on file    Non-medical: Not on file  Tobacco Use  . Smoking status: Never Smoker  . Smokeless tobacco: Never Used  Substance and Sexual Activity  . Alcohol use: No  . Drug use: No  . Sexual activity: Not Currently    Birth control/protection: None  Lifestyle  . Physical activity:    Days per week: Not on file    Minutes per session: Not on file  . Stress: Not on file   Relationships  . Social connections:    Talks on phone: Not on file    Gets together: Not on file    Attends religious service: Not on file    Active member of club or organization: Not on file    Attends meetings of clubs or organizations: Not on file    Relationship status: Not on file  . Intimate partner violence:    Fear of current or ex partner: Not on file    Emotionally abused: Not on file    Physically abused: Not on file    Forced sexual activity: Not on file  Other Topics Concern  . Not on file  Social History Narrative  . Not on file   No current facility-administered medications on file prior to encounter.    No current outpatient medications on file prior to encounter.   Allergies  Allergen Reactions  . Hydrocodone Nausea And Vomiting and Anaphylaxis    ROS:  Review of Systems  Constitutional: Negative for chills, fatigue and fever.  Respiratory: Negative for shortness of breath.   Cardiovascular: Negative for chest pain.  Gastrointestinal: Negative for abdominal pain, nausea and vomiting.  Genitourinary: Positive for pelvic pain and vaginal bleeding. Negative for difficulty urinating, dysuria, flank pain, vaginal discharge  and vaginal pain.  Neurological: Negative for dizziness and headaches.  Psychiatric/Behavioral: Negative.      I have reviewed patient's Past Medical Hx, Surgical Hx, Family Hx, Social Hx, medications and allergies.   Physical Exam   Patient Vitals for the past 24 hrs:  BP Temp Temp src Pulse Resp SpO2 Weight  02/12/19 1858 (!) 148/75 - - 78 - 100 % -  02/12/19 1649 (!) 146/86 98.5 F (36.9 C) Oral 85 18 100 % (!) 165.2 kg   Constitutional: Well-developed, well-nourished female in no acute distress.  Cardiovascular: normal rate Respiratory: normal effort GI: Abd soft, non-tender. Pos BS x 4 MS: Extremities nontender, no edema, normal ROM Neurologic: Alert and oriented x 4.  GU: Neg CVAT.  PELVIC EXAM: Deferred   LAB  RESULTS No results found for this or any previous visit (from the past 24 hour(s)).  --/--/B POS Performed at [facility], [address], [place], [place] [address]  [phone] 1950)  IMAGING US Ob Transvaginal  Result Date: 02/12/2019 CLINICAL DATA:  Pregnant patient in first-trimester pregnancy with vaginal bleeding. EXAM: TRANSVAGINAL OB ULTRASOUND TECHNIQUE: Transvaginal ultrasound was performed for complete evaluation of the gestation as well as the maternal uterus, adnexal regions, and pelvic cul-de-sac. COMPARISON:  Obstetric ultrasound 01/17/2019 FINDINGS: Intrauterine gestational sac: No longer visualized. Yolk sac:  Not Visualized. Embryo:  Not Visualized. Cardiac Activity: Not Visualized. Subchorionic hemorrhage:  Not applicable. Maternal uterus/adnexae: Previous gestational sac is no longer seen. Endometrium is distended by large amount of complex fluid measuring up to 2.4 cm. No internal vascular flow the endometrial contents. The right ovary is visualized and is normal. The left ovary is not seen. There is trace pelvic free fluid. IMPRESSION: The intrauterine gestational sac on exam 1 month prior is no longer visualized consistent with failed pregnancy. Endometrium is distended by complex fluid, but no internal vascularity. Electronically Signed   By: Narda Rutherford M.D.   On: 02/12/2019 19:43   US Ob Transvaginal  Result Date: 01/17/2019 CLINICAL DATA:  28 year old female with abdominal pain in the 1st trimester of pregnancy. No IUP on prior study. Estimated gestational age by LMP 5 weeks 5 days. EXAM: TRANSVAGINAL OB ULTRASOUND TECHNIQUE: Transvaginal ultrasound was performed for complete evaluation of the gestation as well as the maternal uterus, adnexal regions, and pelvic cul-de-sac. COMPARISON:  Ob ultrasound 01/06/2019. FINDINGS: Intrauterine gestational sac: Single Yolk sac:  Not visible Embryo:  Not visible Cardiac Activity: Not applicable MSD: 3.8 mm   5 w   0 d Subchorionic  hemorrhage:  None visualized. Maternal uterus/adnexae: Both ovaries appear normal. The right ovary is 2.6 x 2.7 x 2.5 centimeters. The left is 3.6 x 1.7 x 2.1 centimeters. There is trace simple appearing pelvic free fluid (image 25). IMPRESSION: 1. Probable early intrauterine gestational sac, but no yolk sac, fetal pole, or cardiac activity yet visualized. Recommend follow-up quantitative B-HCG levels and follow-up US in 14 days to confirm and assess viability. This recommendation follows SRU consensus guidelines: Diagnostic Criteria for Nonviable Pregnancy Early in the First Trimester. Malva Limes Med 2013; 625:6389-37. 2. Ovaries appear normal. Trace pelvic free fluid appears physiologic. Electronically Signed   By: Odessa Fleming M.D.   On: 01/17/2019 23:13    MAU Management/MDM: Orders Placed This Encounter  Procedures  . US OB Transvaginal  . Discharge patient    No orders of the defined types were placed in this encounter.   Pt stable, no increase in bleeding or pain in last week.  Pt here in MAU for second opinion about results and management following her [place] in the office today. Transvaginal US ordered, results confirm SAB with bleeding but no evidence of retained products.   Consult Dr Jolayne Panther. Since pt is stable without increased bleeding, pt to f/u with her primary, Dr Billy Coast for management of SAB.  Return to MAU with heavy bleeding or pain.  Pt discharged with strict return precautions.  ASSESSMENT 1. SAB (spontaneous abortion)   2. Vaginal bleeding in pregnancy, first trimester     PLAN Discharge home Allergies as of 02/12/2019      Reactions   Hydrocodone Nausea And Vomiting, Anaphylaxis      Medication List    You have not been prescribed any medications.    Follow-up Information    Olivia Mackie, MD. Call.   Specialty:  Obstetrics and Gynecology Why:  For follow up this week.  Contact information: [address] [place] [address] [phone]        [facility] Follow up.   Why:  For emergencies Contact information: [address] [place] [phone]          Sharen Counter Certified Nurse-Midwife 02/12/2019  9:33 PM

## 2019-03-04 DIAGNOSIS — O039 Complete or unspecified spontaneous abortion without complication: Secondary | ICD-10-CM | POA: Diagnosis not present

## 2019-07-26 ENCOUNTER — Inpatient Hospital Stay (HOSPITAL_COMMUNITY)
Admission: AD | Admit: 2019-07-26 | Discharge: 2019-07-26 | Disposition: A | Discharge status: Home / Self Care | Payer: BLUE CROSS/BLUE SHIELD | Attending: Obstetrics and Gynecology | Admitting: Obstetrics and Gynecology

## 2019-07-26 ENCOUNTER — Other Ambulatory Visit: Payer: Self-pay

## 2019-07-26 ENCOUNTER — Encounter (HOSPITAL_COMMUNITY): Payer: Self-pay | Admitting: Student

## 2019-07-26 DIAGNOSIS — O209 Hemorrhage in early pregnancy, unspecified: Secondary | ICD-10-CM | POA: Insufficient documentation

## 2019-07-26 DIAGNOSIS — Z3A01 Less than 8 weeks gestation of pregnancy: Secondary | ICD-10-CM | POA: Insufficient documentation

## 2019-07-26 DIAGNOSIS — Z3202 Encounter for pregnancy test, result negative: Secondary | ICD-10-CM | POA: Diagnosis not present

## 2019-07-26 DIAGNOSIS — Z3201 Encounter for pregnancy test, result positive: Secondary | ICD-10-CM

## 2019-07-26 LAB — URINALYSIS, ROUTINE W REFLEX MICROSCOPIC
Bilirubin Urine: NEGATIVE
Glucose, UA: NEGATIVE mg/dL
Ketones, ur: NEGATIVE mg/dL
Leukocytes,Ua: NEGATIVE
Nitrite: NEGATIVE
Protein, ur: 100 mg/dL — AB
RBC / HPF: >50 RBC/hpf — ABNORMAL HIGH (ref 0–5)
Specific Gravity, Urine: 1.028 (ref 1.005–1.030)
pH: 6 (ref 5.0–8.0)

## 2019-07-26 LAB — POCT PREGNANCY, URINE: Preg Test, Ur: NEGATIVE

## 2019-07-26 LAB — HCG, QUANTITATIVE, PREGNANCY: hCG, Beta Chain, Quant, S: 25 m[IU]/mL — ABNORMAL HIGH (ref <5)

## 2019-07-26 LAB — HCG, SERUM, QUALITATIVE: Preg, Serum: POSITIVE — AB

## 2019-07-26 NOTE — Discharge Instructions (Signed)
-  call Dr. Kennith Maes office on Monday morning and tell them you need a "follow up beta HCG, or pregnancy hormone test" because you were seen in the MAU over the weekend.  -return to MAU if you develop severe bleeding or intense abdominal pain   Miscarriage A miscarriage is the loss of an unborn baby (fetus) before the 20th week of pregnancy. Follow these instructions at home: Medicines   Take over-the-counter and prescription medicines only as told by your doctor.  If you were prescribed antibiotic medicine, take it as told by your doctor. Do not stop taking the antibiotic even if you start to feel better.  Do not take NSAIDs unless your doctor says that this is safe for you. NSAIDs include aspirin and ibuprofen. These medicines can cause bleeding. Activity  Rest as directed. Ask your doctor what activities are safe for you.  Have someone help you at home during this time. General instructions  Write down how many pads you use each day and how soaked they are.  Watch the amount of tissue or clumps of blood (blood clots) that you pass from your vagina. Save any large amounts of tissue for your doctor.  Do not use tampons, douche, or have sex until your doctor approves.  To help you and your partner with the process of grieving, talk with your doctor or seek counseling.  When you are ready, meet with your doctor to talk about steps you should take for your health. Also, talk with your doctor about steps to take to have a healthy pregnancy in the future.  Keep all follow-up visits as told by your doctor. This is important. Contact a doctor if:  You have a fever or chills.  You have vaginal discharge that smells bad.  You have more bleeding. Get help right away if:  You have very bad cramps or pain in your back or belly.  You pass clumps of blood that are walnut-sized or larger from your vagina.  You pass tissue that is walnut-sized or larger from your vagina.  You soak more  than 1 regular pad in an hour.  You get light-headed or weak.  You faint (pass out).  You have feelings of sadness that do not go away, or you have thoughts of hurting yourself. Summary  A miscarriage is the loss of an unborn baby before the 20th week of pregnancy.  Follow your doctor's instructions for home care. Keep all follow-up appointments.  To help you and your partner with the process of grieving, talk with your doctor or seek counseling. This information is not intended to replace advice given to you by your health care provider. Make sure you discuss any questions you have with your health care provider. Document Released: 02/19/2012 Document Revised: 03/21/2019 Document Reviewed: 01/02/2017 Elsevier Patient Education  2020 [employer].

## 2019-07-26 NOTE — Progress Notes (Signed)
Patient Shannon Francis is a 28 y.o. G2P0020 at "1-2" weeks pregnant here with bleeding and a positive home pregnancy test.  She reports that she ovulated on July 31 (she tracks her ovulation with [employer] ovulation kits). She then started having pink/reddish bleeding on August 17, 2023 (2 days ago). She has passed a few small dime sized clots. Reports some pain like a period, feels well otherwise. She is changing her pad twice a day.   UPT negative here; qualitative bhcg was positive for quantitative HCG was ordered.  Quant BCHG was 25.   Discussed with Dr. Benjie Karvonen patient's report and lab results, deferral of further work-up at this time. Patient will call the office on Monday morning for follow up quant; chart will also be forwarded to Dr. Benjie Karvonen for follow-up.   Explained to patient plan of care, hypothesized that she may be at the end of a very early miscarriage; her pregnancy hormone levels will help [place] know more in 2 days.  She should return to MAU if she starts saturating more than 2 pads an hour for two hours, or if the pain becomes unbearable.

## 2019-07-26 NOTE — MAU Note (Signed)
Discussed with Kooistra CNM, UPT neg, plans to qualitative HCG, pt to wait in lobby for results

## 2019-07-26 NOTE — MAU Note (Signed)
Shannon Francis is a 28 y.o. here in MAU reporting: + UPT this AM. States she has been bleeding like a period since 07/24/19. Having some cramping and back pain.  LMP: 06/29/19  Onset of complaint: 07/24/19  Pain score: back 3/10, pelvis 4/10  Vitals:   07/26/19 1850  BP: (!) 149/92  Pulse: 92  Resp: 16  Temp: 99.1 F (37.3 C)  SpO2: 99%      Lab orders placed from triage: UA, UPT

## 2019-07-29 DIAGNOSIS — O209 Hemorrhage in early pregnancy, unspecified: Secondary | ICD-10-CM | POA: Diagnosis not present

## 2019-07-29 DIAGNOSIS — Z3A01 Less than 8 weeks gestation of pregnancy: Secondary | ICD-10-CM | POA: Diagnosis not present

## 2019-07-30 DIAGNOSIS — O021 Missed abortion: Secondary | ICD-10-CM | POA: Diagnosis not present

## 2019-08-06 DIAGNOSIS — N939 Abnormal uterine and vaginal bleeding, unspecified: Secondary | ICD-10-CM | POA: Diagnosis not present

## 2019-09-05 DIAGNOSIS — N96 Recurrent pregnancy loss: Secondary | ICD-10-CM | POA: Diagnosis not present

## 2019-09-05 DIAGNOSIS — O039 Complete or unspecified spontaneous abortion without complication: Secondary | ICD-10-CM | POA: Diagnosis not present

## 2019-09-05 DIAGNOSIS — Z131 Encounter for screening for diabetes mellitus: Secondary | ICD-10-CM | POA: Diagnosis not present

## 2019-09-05 DIAGNOSIS — Z1329 Encounter for screening for other suspected endocrine disorder: Secondary | ICD-10-CM | POA: Diagnosis not present

## 2019-11-13 DIAGNOSIS — N96 Recurrent pregnancy loss: Secondary | ICD-10-CM | POA: Diagnosis not present

## 2019-11-26 ENCOUNTER — Telehealth: Payer: Self-pay | Admitting: Hematology and Oncology

## 2019-11-26 NOTE — Telephone Encounter (Signed)
A new hem referral from Dr. Ronita Hipps for thrombophilia. Ms. Shannon Francis has been cld and scheduled to see Dr. Lorenso Courier on 12/21 at 2pm. Pt aware to arrive 15 minutes early.

## 2019-11-30 NOTE — Progress Notes (Signed)
[facility] [phone]   [phone]  INITIAL CONSULT NOTE  Patient Care Team: Patient, No Pcp Per as PCP - General (General Practice)  Hematological/Oncological History  #History of Miscarriages 1) 02/12/2019: lost fetus at 8 week mark. Some bleeding around that time 2) 07/26/2019: 1-2 week mark, reported bleeding with positive pregnancy test 3) 11/18/2019: Dr. Billy Coast orders extensive hemophilia panel including APS testing, Protein C, Protein S, Factor V Leiden, Prothrombin, and ATIII. ATIII only abnormality, found to be 69% activity. 4) 12/01/2019: referred to hematology for further evaluation and management.   CHIEF COMPLAINTS/PURPOSE OF CONSULTATION:  "recurrent miscarriages "  HISTORY OF PRESENTING ILLNESS:  Shannon Francis 28 y.o. female with medical history significant for obesity who presents for evaluation of recurrent miscarriages.  On review of the prior records Shannon Francis initially had a lost fetus at the 8-week mark on 02/12/2019.  She noted she had been having bleeding and around that time.  She subsequently had another fetal loss on 07/26/2019 the fetus potentially at the 1 to 2-week mark.  This occurred with some bleeding and a reportedly mildly elevated pregnancy test.  The patient follow-up with Dr. Billy Coast who ordered a large hypercoagulation work-up in early December.  Extensive testing including APS, factor V Leiden, factor II prothrombin mutation, protein S, protein C, all returned as negative, with the exception of Antithrombin 3, which had an activity level of 69%.  Due to concern for a hypercoagulable state the patient was referred to hematology clinic for further evaluation and management.  On exam on exam today Shannon Francis notes she feels well.  She confirms a story of the 2 prior miscarriages in March and August 2020.  She had no other concerning symptoms for pregnancy loss, but does note that she and her husband did not use any form of  contraception over the last few years and she did not become pregnant during that period of time.  She reports irregularity of her periods even noting that she continue to have periods during her prior pregnancy in March 2020.  She has no other concerning hematological history.  She reports having no blood clots, pulmonary emboli, CVA, or cardiac issues.  She does have a sister who has produced 2 children with the last in 2014, however the sister did develop leukemia.  She has no other family history concerning for hematological abnormalities, though reports may be her mother had a blood clot in her lower extremity one point time. A full 10 point ROS was otherwise negative.   On further discussion she reports that her periods have remained irregular.  She reports that she had no period for the month of November.  She is a never smoker and drinks only on social occasions.  She currently works from home.  She has had 2 prior surgeries on her knees with no hematological complications from that.    MEDICAL HISTORY:  Past Medical History:  Diagnosis Date   Headache    Medical history non-contributory    Obesity     SURGICAL HISTORY: Past Surgical History:  Procedure Laterality Date   ANTERIOR CRUCIATE LIGAMENT REPAIR     left   DILATATION & CURETTAGE/HYSTEROSCOPY WITH MYOSURE N/A 08/13/2018   Procedure: DILATATION & CURETTAGE/HYSTEROSCOPY WITH MYOSURE;  Surgeon: Olivia Mackie, MD;  Location: [facility];  Service: Gynecology;  Laterality: N/A;   INCISION AND DRAINAGE     chest   KNEE SURGERY      SOCIAL HISTORY: Social History  Socioeconomic History   Marital status: Married    Spouse name: Not on file   Number of children: Not on file   Years of education: Not on file   Highest education level: Not on file  Occupational History   Not on file  Tobacco Use   Smoking status: Never Smoker   Smokeless tobacco: Never Used  Substance and Sexual Activity   Alcohol use: No    Drug use: No   Sexual activity: Not Currently    Birth control/protection: None  Other Topics Concern   Not on file  Social History Narrative   Not on file   Social Determinants of Health   Financial Resource Strain:    Difficulty of Paying Living Expenses: Not on file  Food Insecurity:    Worried About Running Out of Food in the Last Year: Not on file   Ran Out of Food in the Last Year: Not on file  Transportation Needs:    Lack of Transportation (Medical): Not on file   Lack of Transportation (Non-Medical): Not on file  Physical Activity:    Days of Exercise per Week: Not on file   Minutes of Exercise per Session: Not on file  Stress:    Feeling of Stress : Not on file  Social Connections:    Frequency of Communication with Friends and Family: Not on file   Frequency of Social Gatherings with Friends and Family: Not on file   Attends Religious Services: Not on file   Active Member of Clubs or Organizations: Not on file   Attends Banker Meetings: Not on file   Marital Status: Not on file  Intimate Partner Violence:    Fear of Current or Ex-Partner: Not on file   Emotionally Abused: Not on file   Physically Abused: Not on file   Sexually Abused: Not on file    FAMILY HISTORY: Family History  Problem Relation Age of Onset   Hypertension Mother    Diabetes Mother    Stroke Mother    Cancer Sister    Diabetes Other    Healthy Father     ALLERGIES:  is allergic to hydrocodone.  MEDICATIONS:  Current Outpatient Medications  Medication Sig Dispense Refill   aspirin 81 MG chewable tablet Chew 81 mg by mouth daily.     folic acid (FOLVITE) 1 MG tablet Take 1 mg by mouth 4 (four) times daily.     Prenatal Vit-Fe Fumarate-FA (PRENATAL VITAMINS PO) Take 1 tablet by mouth daily.     No current facility-administered medications for this visit.    REVIEW OF SYSTEMS:   Constitutional: ( - ) fevers, ( - )  chills , ( - ) night  sweats Eyes: ( - ) blurriness of vision, ( - ) double vision, ( - ) watery eyes Ears, nose, mouth, throat, and face: ( - ) mucositis, ( - ) sore throat Respiratory: ( - ) cough, ( - ) dyspnea, ( - ) wheezes Cardiovascular: ( - ) palpitation, ( - ) chest discomfort, ( - ) lower extremity swelling Gastrointestinal:  ( - ) nausea, ( - ) heartburn, ( - ) change in bowel habits Skin: ( - ) abnormal skin rashes Lymphatics: ( - ) new lymphadenopathy, ( - ) easy bruising Neurological: ( - ) numbness, ( - ) tingling, ( - ) new weaknesses Behavioral/Psych: ( - ) mood change, ( - ) new changes  All other systems were reviewed with the patient and are  negative.  PHYSICAL EXAMINATION: ECOG PERFORMANCE STATUS: 0 - Asymptomatic  Vitals:   12/01/19 1408  BP: (!) 143/87  Pulse: 80  Resp: 18  Temp: 98.2 F (36.8 C)  SpO2: 100%   Filed Weights   12/01/19 1408  Weight: (!) 377 lb 3.2 oz (171.1 kg)    GENERAL: well appearing obese African American female in NAD  SKIN: skin color, texture, turgor are normal, no rashes or significant lesions EYES: conjunctiva are pink and non-injected, sclera clear LUNGS: clear to auscultation and percussion with normal breathing effort HEART: regular rate & rhythm and no murmurs and no lower extremity edema ABDOMEN: soft, non-tender, non-distended, normal bowel sounds Musculoskeletal: no cyanosis of digits and no clubbing  PSYCH: alert & oriented x 3, fluent speech NEURO: no focal motor/sensory deficits  LABORATORY DATA:  I have reviewed the data as listed Recent Results (from the past 2160 hour(s))  Retic Panel     Status: None   Collection Time: 12/01/19  2:58 PM  Result Value Ref Range   Retic Ct Pct 1.5 0.4 - 3.1 %   RBC. 4.74 3.87 - 5.11 MIL/uL   Retic Count, Absolute 70.2 19.0 - 186.0 K/uL   Immature Retic Fract 6.4 2.3 - 15.9 %   Reticulocyte Hemoglobin 29.6 >27.9 pg    Comment: Performed at [facility], [address], [place], [place] [address]  CMP (Cancer Center only)     Status: Abnormal   Collection Time: 12/01/19  2:59 PM  Result Value Ref Range   Sodium 141 135 - 145 mmol/L   Potassium 4.0 3.5 - 5.1 mmol/L   Chloride 105 98 - 111 mmol/L   CO2 27 22 - 32 mmol/L   Glucose, Bld 92 70 - 99 mg/dL   BUN 13 6 - 20 mg/dL   Creatinine 6.04 5.40 - 1.00 mg/dL   Calcium 8.6 (L) 8.9 - 10.3 mg/dL   Total Protein 7.4 6.5 - 8.1 g/dL   Albumin 3.4 (L) 3.5 - 5.0 g/dL   AST 19 15 - 41 U/L   ALT 23 0 - 44 U/L   Alkaline Phosphatase 83 38 - 126 U/L   Total Bilirubin 0.8 0.3 - 1.2 mg/dL   GFR, Est Non Af Am >98 >60 mL/min   GFR, Est AFR Am >60 >60 mL/min   Anion gap 9 5 - 15    Comment: Performed at [facility], [address] [place], [place] [address]  CBC with Differential (Cancer Center Only)     Status: Abnormal   Collection Time: 12/01/19  2:59 PM  Result Value Ref Range   WBC Count 7.6 4.0 - 10.5 K/uL   RBC 4.81 3.87 - 5.11 MIL/uL   Hemoglobin 12.1 12.0 - 15.0 g/dL   HCT 78.2 95.6 - 21.3 %   MCV 81.3 80.0 - 100.0 fL   MCH 25.2 (L) 26.0 - 34.0 pg   MCHC 30.9 30.0 - 36.0 g/dL   RDW 08.6 (H) 57.8 - 46.9 %   Platelet Count 280 150 - 400 K/uL   nRBC 0.0 0.0 - 0.2 %   Neutrophils Relative % 54 %   Neutro Abs 4.1 1.7 - 7.7 K/uL   Lymphocytes Relative 35 %   Lymphs Abs 2.6 0.7 - 4.0 K/uL   Monocytes Relative 8 %   Monocytes Absolute 0.6 0.1 - 1.0 K/uL   Eosinophils Relative 2 %   Eosinophils Absolute 0.1 0.0 - 0.5 K/uL  Basophils Relative 0 %   Basophils Absolute 0.0 0.0 - 0.1 K/uL   Immature Granulocytes 1 %   Abs Immature Granulocytes 0.05 0.00 - 0.07 K/uL    Comment: Performed at [facility], [place] [address] [place], [place] [address]    PATHOLOGY: None to review.   RADIOGRAPHIC STUDIES: None to review.  No results found.  ASSESSMENT & PLAN Shannon Francis 28 y.o. female with medical history significant for obesity who presents for  evaluation of recurrent miscarriages.  The records and have discussion with the patient it is apparent that she is having difficulties with maintaining her pregnancy.  Thrombophilia work-up is certainly merited in cases with recurrent miscarriages.  The patient is already undergone extensive work-up which have ruled out the most common causes of thrombophilia.  The patient has a mildly decreased Antithrombin III activity level as 69%.  Typically a normal value would be considered 80% or greater.  This alone may not explain the findings we are seeing with her recurrent miscarriages, however at this time I do not think there is a clear etiology.  Review of the literature shows that Antithrombin III deficiency can be associated with an increased risk of fetal demise (Thromb Haemost. 1996 Mar;75(3):387-8.).  Literature is limited and recommendations are against anticoagulation for the prevention of fetal demise in the case of Antithrombin 3 protein S or protein C deficiency ( Blood. 2016 Mar 31;127(13):1650-5.).  Additionally if the patient did generally have Antithrombin III deficiency there would be no recommendations regarding generalized prophylaxis outside of pregnancy given her lack of prior thrombotic events.  At this time her clinical picture is not entirely consistent with an Antithrombin III deficiency.  At this time I think it be reasonable to consider OB/GYN work-up for the etiology of her recurrent miscarriages.  In the interim we will order a repeat Antithrombin III level (Blood. 2019 Dec 26;134(26):2346-2353.).  If this remains low we can consider retesting her in approximately 3 months time to the diagnosis.  Even then (outside of the setting of a DVT) anticoagulation would be controversial.  We recommend immediate contact and consult to hematology in the event that she is found to be pregnant again for consideration of discussion of anticoagulation therapy and consideration of measures to help  prevent the loss of pregnancy.  #Recurrent Miscarriages #Low Antithrombin III activity --today will repeat an antithrombin III antigen and activity level --will order CMP, CBC, iron panel  --extensive thrombophilia workup performed previously, including Factor V Leiden, APS, Prothrombin gene mutation, Protein C, Protein S. All of these were negative. No further hypercoagulation studies are recommended.  --continue evaluation with Ob/Gyn for recurrent loses --will place interval f/u visit in 3 months time for repeat testing of Antithrombin 3 level if it remains low on today's testing --in the event the patient becomes pregnant, please notify the hematology team immediately for consideration of start lovenox therapy. Evidence for this helping with intrauterine fetal demise is limited, but the discussion could be had at that time.    Orders Placed This Encounter  Procedures   CBC with Differential ([facility] Only)    Standing Status:   Future    Number of Occurrences:   1    Standing Expiration Date:   11/30/2020   CMP ([facility] only)    Standing Status:   Future    Number of Occurrences:   1    Standing Expiration Date:   11/30/2020   Iron and TIBC  Standing Status:   Future    Number of Occurrences:   1    Standing Expiration Date:   11/30/2020   Ferritin    Standing Status:   Future    Number of Occurrences:   1    Standing Expiration Date:   11/30/2020   Retic Panel    Standing Status:   Future    Number of Occurrences:   1    Standing Expiration Date:   11/30/2020   Miscellaneous [facility] test (send-out)    Standing Status:   Future    Number of Occurrences:   1    Standing Expiration Date:   11/30/2020    Order Specific Question:   Test name / description:    Answer:   Antithrombin panel (TEST: [id number] CPT: 85300; 85301)    All questions were answered. The patient knows to call the clinic with any problems, questions or concerns.  A total of more than 60  minutes were spent on this encounter and over half of that time was spent on counseling and coordination of care as outlined above.   Ulysees BarnsJohn T. Samon Dishner, MD Department of Hematology/Oncology [facility] Phone: [phone] Pager: [phone] Email: [email or web address]  12/01/2019 5:31 PM   Pabinger I, Viviann Sparehaler J. How I treat patients with hereditary antithrombin deficiency. Blood. 2019 Dec 26;134(26):2346-2353. doi: 10.1182/blood.3086578469581-698-5252. PMID: 6295284131697819. --The lack of controlled trials or even observational studies of large cohorts does not allow therapeutic decisions to be based on scientific evidence.   Shara BlazingSkeith L, Carrier M, CarrollwoodKaaja R, SugarcreekMartinelli I, FairviewPetroff D, Schleuner E, AddisonLaskin CA, GlendoRodger [place]. A meta-analysis of low-molecular-weight heparin to prevent pregnancy loss in women with inherited thrombophilia. Blood. 2016 Mar 31;127(13):1650-5. doi: 10.1182/blood-2015-12-626739. Epub 2016 Feb 2. PMID: 3244010226837697. --meta-analysis of randomized controlled trials comparing low-molecular-weight heparin (LMWH) vs no LMWH in women with inherited thrombophilia and prior late (?10 weeks) or recurrent early (<10 weeks) pregnancy loss.There was no significant difference in livebirth rates with the use of LMWH compared with no LMWH (relative risk, 0.81; 95% confidence interval, 0.55-1.19;P= .28), suggesting no benefit of LMWH in preventing recurrent pregnancy loss in women with inherited thrombophilia.  Chinita GreenlandSanson BJ, Friederich PW, Simioni P, Zanardi S, HoraceHilsman MV, Girolami A, ten Doffingate JW, Prins [facility]. The risk of abortion and stillbirth in antithrombin-, protein C-, and protein S-deficient women. Thromb Haemost. 1996 Mar;75(3):387-8. PM: 7253664: 8701393. --The relative risk of abortion and stillbirth per pregnancy for deficient women as compared to non-deficient women was 2.0 (95% C.I. 1.2-3.3).

## 2019-12-01 ENCOUNTER — Inpatient Hospital Stay: Payer: BC Managed Care – PPO

## 2019-12-01 ENCOUNTER — Inpatient Hospital Stay: Payer: BC Managed Care – PPO | Attending: Hematology and Oncology | Admitting: Hematology and Oncology

## 2019-12-01 ENCOUNTER — Other Ambulatory Visit: Payer: Self-pay

## 2019-12-01 VITALS — BP 143/87 | HR 80 | Temp 98.2°F | Resp 18 | Ht 65.0 in | Wt 377.2 lb

## 2019-12-01 DIAGNOSIS — Z8759 Personal history of other complications of pregnancy, childbirth and the puerperium: Secondary | ICD-10-CM | POA: Diagnosis not present

## 2019-12-01 DIAGNOSIS — D6859 Other primary thrombophilia: Secondary | ICD-10-CM | POA: Diagnosis not present

## 2019-12-01 DIAGNOSIS — D6869 Other thrombophilia: Secondary | ICD-10-CM

## 2019-12-01 DIAGNOSIS — E669 Obesity, unspecified: Secondary | ICD-10-CM | POA: Diagnosis not present

## 2019-12-01 LAB — CMP (CANCER CENTER ONLY)
ALT: 23 U/L (ref 0–44)
AST: 19 U/L (ref 15–41)
Albumin: 3.4 g/dL — ABNORMAL LOW (ref 3.5–5.0)
Alkaline Phosphatase: 83 U/L (ref 38–126)
Anion gap: 9 (ref 5–15)
BUN: 13 mg/dL (ref 6–20)
CO2: 27 mmol/L (ref 22–32)
Calcium: 8.6 mg/dL — ABNORMAL LOW (ref 8.9–10.3)
Chloride: 105 mmol/L (ref 98–111)
Creatinine: 0.87 mg/dL (ref 0.44–1.00)
GFR, Est AFR Am: >60 mL/min (ref >60)
GFR, Estimated: >60 mL/min (ref >60)
Glucose, Bld: 92 mg/dL (ref 70–99)
Potassium: 4 mmol/L (ref 3.5–5.1)
Sodium: 141 mmol/L (ref 135–145)
Total Bilirubin: 0.8 mg/dL (ref 0.3–1.2)
Total Protein: 7.4 g/dL (ref 6.5–8.1)

## 2019-12-01 LAB — CBC WITH DIFFERENTIAL (CANCER CENTER ONLY)
Abs Immature Granulocytes: 0.05 10*3/uL (ref 0.00–0.07)
Basophils Absolute: 0 10*3/uL (ref 0.0–0.1)
Basophils Relative: 0 %
Eosinophils Absolute: 0.1 10*3/uL (ref 0.0–0.5)
Eosinophils Relative: 2 %
HCT: 39.1 % (ref 36.0–46.0)
Hemoglobin: 12.1 g/dL (ref 12.0–15.0)
Immature Granulocytes: 1 %
Lymphocytes Relative: 35 %
Lymphs Abs: 2.6 10*3/uL (ref 0.7–4.0)
MCH: 25.2 pg — ABNORMAL LOW (ref 26.0–34.0)
MCHC: 30.9 g/dL (ref 30.0–36.0)
MCV: 81.3 fL (ref 80.0–100.0)
Monocytes Absolute: 0.6 10*3/uL (ref 0.1–1.0)
Monocytes Relative: 8 %
Neutro Abs: 4.1 10*3/uL (ref 1.7–7.7)
Neutrophils Relative %: 54 %
Platelet Count: 280 10*3/uL (ref 150–400)
RBC: 4.81 MIL/uL (ref 3.87–5.11)
RDW: 15.8 % — ABNORMAL HIGH (ref 11.5–15.5)
WBC Count: 7.6 10*3/uL (ref 4.0–10.5)
nRBC: 0 % (ref 0.0–0.2)

## 2019-12-01 LAB — RETIC PANEL
Immature Retic Fract: 6.4 % (ref 2.3–15.9)
RBC.: 4.74 MIL/uL (ref 3.87–5.11)
Retic Count, Absolute: 70.2 10*3/uL (ref 19.0–186.0)
Retic Ct Pct: 1.5 % (ref 0.4–3.1)
Reticulocyte Hemoglobin: 29.6 pg (ref >27.9)

## 2019-12-02 ENCOUNTER — Telehealth: Payer: Self-pay | Admitting: Hematology and Oncology

## 2019-12-02 LAB — ANTITHROMBIN PANEL
AT III AG PPP IMM-ACNC: 65 % — ABNORMAL LOW (ref 72–124)
Antithrombin Activity: 82 % (ref 75–135)

## 2019-12-02 LAB — IRON AND TIBC
Iron: 47 ug/dL (ref 41–142)
Saturation Ratios: 18 % — ABNORMAL LOW (ref 21–57)
TIBC: 263 ug/dL (ref 236–444)
UIBC: 216 ug/dL (ref 120–384)

## 2019-12-02 LAB — MISC LABCORP TEST (SEND OUT): Labcorp test code: 115594

## 2019-12-02 LAB — FERRITIN: Ferritin: 65 ng/mL (ref 11–307)

## 2019-12-02 NOTE — Telephone Encounter (Signed)
Scheduled per los. Called and left msg. Mailed printout  °

## 2019-12-04 ENCOUNTER — Telehealth: Payer: Self-pay | Admitting: Hematology and Oncology

## 2019-12-04 NOTE — Telephone Encounter (Signed)
Called Shannon Francis to discuss the results of her blood work. Findings were consistent with a normal ATIII activity level at 82%. The antigen was low, however that test is relevant only if activity level is depressed. Overall I do not think her episodes of fetal demise are 2/2 to a coagulopathy and she has no other concerning thrombotic history.   At this time there is no further f/u required in our clinic. If new issues or concerns were to arise we would be happy to see her back in our clinic.  Ledell Peoples, MD Department of Hematology/Oncology [place] at [facility] Phone: [phone] Pager: [phone] Email: [email or web address]

## 2020-01-05 ENCOUNTER — Other Ambulatory Visit: Payer: Self-pay

## 2020-01-05 ENCOUNTER — Encounter (HOSPITAL_COMMUNITY): Payer: Self-pay

## 2020-01-05 DIAGNOSIS — J019 Acute sinusitis, unspecified: Secondary | ICD-10-CM | POA: Diagnosis not present

## 2020-01-05 DIAGNOSIS — Z79899 Other long term (current) drug therapy: Secondary | ICD-10-CM | POA: Insufficient documentation

## 2020-01-05 DIAGNOSIS — M542 Cervicalgia: Secondary | ICD-10-CM | POA: Diagnosis not present

## 2020-01-05 NOTE — ED Triage Notes (Addendum)
Pt states she has swollen and painful lymph nodes. Pt states her sinuses are bothering her. Pt also complaining of ear ache.

## 2020-01-06 ENCOUNTER — Emergency Department (HOSPITAL_COMMUNITY)
Admission: EM | Admit: 2020-01-06 | Discharge: 2020-01-06 | Disposition: A | Discharge status: Home / Self Care | Payer: BC Managed Care – PPO | Attending: Emergency Medicine | Admitting: Emergency Medicine

## 2020-01-06 DIAGNOSIS — J019 Acute sinusitis, unspecified: Secondary | ICD-10-CM

## 2020-01-06 MED ORDER — AMOXICILLIN-POT CLAVULANATE 875-125 MG PO TABS: 1.0000 | ORAL_TABLET | Freq: Two times a day (BID) | ORAL | 0 refills | Status: DC

## 2020-01-06 MED ORDER — IBUPROFEN 600 MG PO TABS: 600.0000 mg | ORAL_TABLET | Freq: Four times a day (QID) | ORAL | 0 refills | Status: DC | PRN

## 2020-01-06 NOTE — Discharge Instructions (Signed)
Take Augmentin as prescribed.  You have been prescribed ibuprofen to use for management of pain.  We recommend continued use of saline nasal sprays or sinus rinses.  You may benefit from daily Zyrtec or Claritin.  Follow-up with a primary care doctor to ensure resolution of symptoms.  You may return to the ED for any new or concerning symptoms.

## 2020-01-06 NOTE — ED Provider Notes (Signed)
[facility] DEPT Provider Note   CSN: [id number] Arrival date & time: 01/05/20  2131     History Chief Complaint  Patient presents with  . Neck Pain    Shannon Francis is a 29 y.o. female.  29 year old female presents to the emergency department for evaluation of sinus congestion and lymphadenopathy.  She states that she noticed some discomfort and swelling to her lymph nodes 1.5 weeks ago.  Had onset of nasal congestion and postnasal drip shortly after.  Her symptoms have remained constant, unchanged.  Developed associated right earache 2 days ago.  She has not had any drainage from the ear, sore throat, sick contacts, cough, fevers.  Reports using over-the-counter sinus medication without relief.  The history is provided by the patient. No language interpreter was used.       Past Medical History:  Diagnosis Date  . Headache   . Medical history non-contributory   . Obesity     Patient Active Problem List   Diagnosis Date Noted  . EAR PAIN, LEFT 10/15/2007  . INFECTION, HUMAN PAPILLOMAVIRUS 06/10/2007  . HYPERTROPHY, BREAST 05/10/2007  . EXCESSIVE MENSTRUATION 04/01/2007  . OBESITY, NOS 02/07/2007  . ACNE 02/07/2007  . KNEE MENISCUS INJURY, UNSPECIFIED 02/07/2007    Past Surgical History:  Procedure Laterality Date  . ANTERIOR CRUCIATE LIGAMENT REPAIR     left  . DILATATION & CURETTAGE/HYSTEROSCOPY WITH MYOSURE N/A 08/13/2018   Procedure: DILATATION & CURETTAGE/HYSTEROSCOPY WITH MYOSURE;  Surgeon: Brien Few, MD;  Location: [facility];  Service: Gynecology;  Laterality: N/A;  . INCISION AND DRAINAGE     chest  . KNEE SURGERY       OB History    Gravida  2   Para      Term      Preterm      AB  2   Living  0     SAB  1   TAB  1   Ectopic      Multiple      Live Births              Family History  Problem Relation Age of Onset  . Hypertension Mother   . Diabetes Mother   . Stroke Mother   . Cancer Sister    . Diabetes Other   . Healthy Father     Social History   Tobacco Use  . Smoking status: Never Smoker  . Smokeless tobacco: Never Used  Substance Use Topics  . Alcohol use: No  . Drug use: No    Home Medications Prior to Admission medications   Medication Sig Start Date End Date Taking? Authorizing Provider  amoxicillin-clavulanate (AUGMENTIN) 875-125 MG tablet Take 1 tablet by mouth 2 (two) times daily. 01/06/20   Antonietta Breach, PA-C  aspirin 81 MG chewable tablet Chew 81 mg by mouth daily.    [provider]  folic acid (FOLVITE) 1 MG tablet Take 1 mg by mouth 4 (four) times daily.    [provider]  ibuprofen (ADVIL) 600 MG tablet Take 1 tablet (600 mg total) by mouth every 6 (six) hours as needed for mild pain or moderate pain. 01/06/20   Antonietta Breach, PA-C  Prenatal Vit-Fe Fumarate-FA (PRENATAL VITAMINS PO) Take 1 tablet by mouth daily.    [provider]    Allergies    Hydrocodone  Review of Systems   Review of Systems  Ten systems reviewed and are negative for acute change, except as  noted in the HPI.    Physical Exam Updated Vital Signs BP (!) 158/115   Pulse 89   Temp 98.9 F (37.2 C) (Oral)   Resp 18   LMP 01/02/2020 (Exact Date)   SpO2 99%   Physical Exam Vitals and nursing note reviewed.  Constitutional:      General: She is not in acute distress.    Appearance: She is well-developed. She is not diaphoretic.     Comments: Nontoxic appearing and in NAD. Super morbildy obese AA female.  HENT:     Head: Normocephalic and atraumatic.     Right Ear: Ear canal and external ear normal.     Left Ear: Ear canal and external ear normal.     Ears:     Comments: Minimal erythema of the right tympanic membrane compared to the left.  Cone of light intact bilaterally without bulging, retraction, perforation of TMs.  Moderate cerumen in the right ear canal.    Mouth/Throat:     Comments: No trismus.  Tolerating secretions without  difficulty.  Normal phonation.  Posterior oropharynx is without edema or erythema.  No exudates appreciated. Eyes:     General: No scleral icterus.    Conjunctiva/sclera: Conjunctivae normal.  Neck:     Comments: No nuchal rigidity or meningismus.  Minimal submental lymphadenopathy. Pulmonary:     Effort: Pulmonary effort is normal. No respiratory distress.     Comments: Respirations even and unlabored Musculoskeletal:        General: Normal range of motion.     Cervical back: Normal range of motion.  Skin:    General: Skin is warm and dry.     Coloration: Skin is not pale.     Findings: No erythema or rash.  Neurological:     Mental Status: She is alert and oriented to person, place, and time.  Psychiatric:        Behavior: Behavior normal.     ED Results / Procedures / Treatments   Labs (all labs ordered are listed, but only abnormal results are displayed) Labs Reviewed - No data to display  EKG None  Radiology No results found.  Procedures Procedures (including critical care time)  Medications Ordered in ED Medications - No data to display  ED Course  I have reviewed the triage vital signs and the nursing notes.  Pertinent labs & imaging results that were available during my care of the patient were reviewed by me and considered in my medical decision making (see chart for details).    MDM Rules/Calculators/A&P                      Patient complaining of symptoms of sinusitis.  Symptoms have been present for greater than 10 days with nasal discharge and lymphadenopathy.  Concern for acute bacterial rhinosinusitis.  Patient discharged with Augmentin.  Instructions given for warm saline nasal wash and recommendations for follow-up with primary care physician.  Return precautions discussed and provided. Patient discharged in stable condition with no unaddressed concerns.   Final Clinical Impression(s) / ED Diagnoses Final diagnoses:  Acute non-recurrent  sinusitis, unspecified location    Rx / DC Orders ED Discharge Orders         Ordered    amoxicillin-clavulanate (AUGMENTIN) 875-125 MG tablet  2 times daily     01/06/20 0047    ibuprofen (ADVIL) 600 MG tablet  Every 6 hours PRN     01/06/20 0047  Antony Madura, PA-C 01/06/20 0101    Palumbo, April, MD 01/06/20 [id number]

## 2020-02-16 DIAGNOSIS — N915 Oligomenorrhea, unspecified: Secondary | ICD-10-CM | POA: Diagnosis not present

## 2020-03-01 ENCOUNTER — Telehealth: Payer: Self-pay | Admitting: *Deleted

## 2020-03-01 ENCOUNTER — Inpatient Hospital Stay: Payer: BC Managed Care – PPO

## 2020-03-01 ENCOUNTER — Inpatient Hospital Stay: Payer: BC Managed Care – PPO | Admitting: Hematology and Oncology

## 2020-03-01 NOTE — Telephone Encounter (Signed)
TCT patient to advise that she does not need to come in today. No answer but was able to leave vm message for patient not to come today.  Cancer Center phone # provided if patient needs to call back

## 2020-05-28 DIAGNOSIS — Z01419 Encounter for gynecological examination (general) (routine) without abnormal findings: Secondary | ICD-10-CM | POA: Diagnosis not present

## 2020-05-28 DIAGNOSIS — Z131 Encounter for screening for diabetes mellitus: Secondary | ICD-10-CM | POA: Diagnosis not present

## 2020-05-28 DIAGNOSIS — Z113 Encounter for screening for infections with a predominantly sexual mode of transmission: Secondary | ICD-10-CM | POA: Diagnosis not present

## 2020-05-28 DIAGNOSIS — Z13220 Encounter for screening for lipoid disorders: Secondary | ICD-10-CM | POA: Diagnosis not present

## 2020-05-28 DIAGNOSIS — E559 Vitamin D deficiency, unspecified: Secondary | ICD-10-CM | POA: Diagnosis not present

## 2020-05-28 DIAGNOSIS — Z124 Encounter for screening for malignant neoplasm of cervix: Secondary | ICD-10-CM | POA: Diagnosis not present

## 2020-05-28 DIAGNOSIS — Z1329 Encounter for screening for other suspected endocrine disorder: Secondary | ICD-10-CM | POA: Diagnosis not present

## 2020-05-28 DIAGNOSIS — Z130 Encounter for screening for diseases of the blood and blood-forming organs and certain disorders involving the immune mechanism: Secondary | ICD-10-CM | POA: Diagnosis not present

## 2020-07-05 DIAGNOSIS — N979 Female infertility, unspecified: Secondary | ICD-10-CM | POA: Diagnosis not present

## 2020-07-09 DIAGNOSIS — Z20828 Contact with and (suspected) exposure to other viral communicable diseases: Secondary | ICD-10-CM | POA: Diagnosis not present

## 2020-07-12 DIAGNOSIS — N970 Female infertility associated with anovulation: Secondary | ICD-10-CM | POA: Diagnosis not present

## 2020-07-13 DIAGNOSIS — Z3189 Encounter for other procreative management: Secondary | ICD-10-CM | POA: Diagnosis not present

## 2020-07-24 DIAGNOSIS — Z20828 Contact with and (suspected) exposure to other viral communicable diseases: Secondary | ICD-10-CM | POA: Diagnosis not present

## 2020-09-06 DIAGNOSIS — N970 Female infertility associated with anovulation: Secondary | ICD-10-CM | POA: Diagnosis not present

## 2020-09-07 DIAGNOSIS — Z23 Encounter for immunization: Secondary | ICD-10-CM | POA: Diagnosis not present

## 2020-09-07 DIAGNOSIS — Z3183 Encounter for assisted reproductive fertility procedure cycle: Secondary | ICD-10-CM | POA: Diagnosis not present

## 2020-09-07 DIAGNOSIS — N979 Female infertility, unspecified: Secondary | ICD-10-CM | POA: Diagnosis not present

## 2020-09-13 ENCOUNTER — Other Ambulatory Visit: Payer: Self-pay

## 2020-09-13 ENCOUNTER — Ambulatory Visit
Admission: EM | Admit: 2020-09-13 | Discharge: 2020-09-13 | Disposition: A | Discharge status: Home / Self Care | Payer: BC Managed Care – PPO | Attending: Family Medicine | Admitting: Family Medicine

## 2020-09-13 DIAGNOSIS — M722 Plantar fascial fibromatosis: Secondary | ICD-10-CM | POA: Diagnosis not present

## 2020-09-13 MED ORDER — MELOXICAM 7.5 MG PO TABS: 7.5000 mg | ORAL_TABLET | Freq: Every day | ORAL | 0 refills | Status: DC

## 2020-09-13 NOTE — ED Triage Notes (Signed)
Pt states she has been experiencing foot pain for about a week and a half. Pt states she doesn't know the cause and has had no recent injuries. Pt is aox4 and ambulates with a limp.

## 2020-09-13 NOTE — ED Provider Notes (Signed)
[facility]    CSN: [id number] Arrival date & time: 09/13/20  1043      History   Chief Complaint Chief Complaint  Patient presents with  . Foot Pain    left x 1.5 weeks    HPI Shannon Francis is a 29 y.o. female.   Patient reports pain in her left foot.  There is no injury.  Pain is not there as long as she is not standing or walking.  She describes the pain in her forefoot.  There is no pain in the ankle or posteriorly.  HPI  Past Medical History:  Diagnosis Date  . Headache   . Medical history non-contributory   . Obesity     Patient Active Problem List   Diagnosis Date Noted  . EAR PAIN, LEFT 10/15/2007  . INFECTION, HUMAN PAPILLOMAVIRUS 06/10/2007  . HYPERTROPHY, BREAST 05/10/2007  . EXCESSIVE MENSTRUATION 04/01/2007  . OBESITY, NOS 02/07/2007  . ACNE 02/07/2007  . KNEE MENISCUS INJURY, UNSPECIFIED 02/07/2007    Past Surgical History:  Procedure Laterality Date  . ANTERIOR CRUCIATE LIGAMENT REPAIR     left  . DILATATION & CURETTAGE/HYSTEROSCOPY WITH MYOSURE N/A 08/13/2018   Procedure: DILATATION & CURETTAGE/HYSTEROSCOPY WITH MYOSURE;  Surgeon: Olivia Mackie, MD;  Location: [facility];  Service: Gynecology;  Laterality: N/A;  . INCISION AND DRAINAGE     chest  . KNEE SURGERY      OB History    Gravida  2   Para      Term      Preterm      AB  2   Living  0     SAB  1   TAB  1   Ectopic      Multiple      Live Births               Home Medications    Prior to Admission medications   Medication Sig Start Date End Date Taking? Authorizing Provider  aspirin 81 MG chewable tablet Chew 81 mg by mouth daily.   Yes [provider]  ibuprofen (ADVIL) 600 MG tablet Take 1 tablet (600 mg total) by mouth every 6 (six) hours as needed for mild pain or moderate pain. 01/06/20  Yes Antony Madura, PA-C  amoxicillin-clavulanate (AUGMENTIN) 875-125 MG tablet Take 1 tablet by mouth 2 (two) times daily. 01/06/20   Antony Madura,  PA-C  folic acid (FOLVITE) 1 MG tablet Take 1 mg by mouth 4 (four) times daily.    [provider]  Prenatal Vit-Fe Fumarate-FA (PRENATAL VITAMINS PO) Take 1 tablet by mouth daily.    [provider]    Family History Family History  Problem Relation Age of Onset  . Hypertension Mother   . Diabetes Mother   . Stroke Mother   . Cancer Sister   . Diabetes Other   . Healthy Father     Social History Social History   Tobacco Use  . Smoking status: Never Smoker  . Smokeless tobacco: Never Used  Vaping Use  . Vaping Use: Never used  Substance Use Topics  . Alcohol use: No  . Drug use: No     Allergies   Hydrocodone   Review of Systems Review of Systems  Musculoskeletal: Positive for arthralgias and gait problem.  All other systems reviewed and are negative.    Physical Exam Triage Vital Signs ED Triage Vitals  Enc Vitals Group     BP 09/13/20 1107 137/87  Pulse Rate 09/13/20 1107 90     Resp 09/13/20 1107 20     Temp 09/13/20 1107 98.4 F (36.9 C)     Temp Source 09/13/20 1107 Oral     SpO2 09/13/20 1107 96 %     Weight --      Height --      Head Circumference --      Peak Flow --      Pain Score 09/13/20 1109 7     Pain Loc --      Pain Edu? --      Excl. in GC? --    No data found.  Updated Vital Signs BP 137/87 (BP Location: Left Arm)   Pulse 90   Temp 98.4 F (36.9 C) (Oral)   Resp 20   SpO2 96%   Visual Acuity Right Eye Distance:   Left Eye Distance:   Bilateral Distance:    Right Eye Near:   Left Eye Near:    Bilateral Near:     Physical Exam Vitals and nursing note reviewed.  Constitutional:      Appearance: Normal appearance. She is obese.  Cardiovascular:     Rate and Rhythm: Normal rate.  Musculoskeletal:     Comments: Left foot: No deformity.  There is no redness or increased temp.  There is no tenderness in the heel area suggestive of a spur.  Tenderness is more in the forefoot  Neurological:      Mental Status: She is alert.      UC Treatments / Results  Labs (all labs ordered are listed, but only abnormal results are displayed) Labs Reviewed - No data to display  EKG   Radiology No results found.  Procedures Procedures (including critical care time)  Medications Ordered in UC Medications - No data to display  Initial Impression / Assessment and Plan / UC Course  I have reviewed the triage vital signs and the nursing notes.  Pertinent labs & imaging results that were available during my care of the patient were reviewed by me and considered in my medical decision making (see chart for details).     Foot pain probably secondary to plantar fasciitis.  Have recommended good supportive walking shoe and anti-inflammatory Final Clinical Impressions(s) / UC Diagnoses   Final diagnoses:  None   Discharge Instructions   None    ED Prescriptions    None     PDMP not reviewed this encounter.   Frederica Kuster, MD 09/13/20 1122

## 2020-09-27 ENCOUNTER — Other Ambulatory Visit: Payer: Self-pay | Admitting: Obstetrics & Gynecology

## 2020-09-27 DIAGNOSIS — N979 Female infertility, unspecified: Secondary | ICD-10-CM

## 2020-09-30 ENCOUNTER — Other Ambulatory Visit: Payer: BC Managed Care – PPO

## 2020-10-03 ENCOUNTER — Other Ambulatory Visit: Payer: Self-pay

## 2020-10-03 ENCOUNTER — Ambulatory Visit
Admission: EM | Admit: 2020-10-03 | Discharge: 2020-10-03 | Disposition: A | Discharge status: Home / Self Care | Payer: BC Managed Care – PPO | Attending: Emergency Medicine | Admitting: Emergency Medicine

## 2020-10-03 ENCOUNTER — Encounter: Payer: Self-pay | Admitting: Emergency Medicine

## 2020-10-03 DIAGNOSIS — N611 Abscess of the breast and nipple: Secondary | ICD-10-CM

## 2020-10-03 MED ORDER — AMOXICILLIN-POT CLAVULANATE 875-125 MG PO TABS: 1.0000 | ORAL_TABLET | Freq: Two times a day (BID) | ORAL | 0 refills | Status: DC

## 2020-10-03 MED ORDER — MELOXICAM 7.5 MG PO TABS: 7.5000 mg | ORAL_TABLET | Freq: Every day | ORAL | 0 refills | Status: DC

## 2020-10-03 NOTE — Discharge Instructions (Addendum)
Keep area(s) clean and dry. °Apply hot compress / towel for 5-10 minutes 3-5 times daily. °Take antibiotic as prescribed with food - important to complete course. °Return for worsening pain, redness, swelling, discharge, fever. ° °Helpful prevention tips: °Keep nails short to avoid secondary skin infections. °Use new, clean razors when shaving. °Avoid antiperspirants - look for deodorants without aluminum. °Avoid wearing underwire bras as this can irritate the area further.  °

## 2020-10-03 NOTE — ED Triage Notes (Signed)
Pt here for abscess under right breast with pain and swelling x 1 week; pt denies drainage

## 2020-10-03 NOTE — ED Provider Notes (Signed)
[facility]    CSN: [id number] Arrival date & time: 10/03/20  0820      History   Chief Complaint Chief Complaint  Patient presents with  . Abscess    HPI Shannon Francis is a 29 y.o. female  Presenting for possible abscess in the right breast.  Endorsing pain x1 week.  Using hot compresses with some relief.  States she had similar issue with the left upper breast about 10 years ago: Had to have I&D.  Denied fever, nipple discharge or retraction, change in appearance of breast.   Past Medical History:  Diagnosis Date  . Headache   . Medical history non-contributory   . Obesity     Patient Active Problem List   Diagnosis Date Noted  . EAR PAIN, LEFT 10/15/2007  . INFECTION, HUMAN PAPILLOMAVIRUS 06/10/2007  . HYPERTROPHY, BREAST 05/10/2007  . EXCESSIVE MENSTRUATION 04/01/2007  . OBESITY, NOS 02/07/2007  . ACNE 02/07/2007  . KNEE MENISCUS INJURY, UNSPECIFIED 02/07/2007    Past Surgical History:  Procedure Laterality Date  . ANTERIOR CRUCIATE LIGAMENT REPAIR     left  . DILATATION & CURETTAGE/HYSTEROSCOPY WITH MYOSURE N/A 08/13/2018   Procedure: DILATATION & CURETTAGE/HYSTEROSCOPY WITH MYOSURE;  Surgeon: Olivia Mackie, MD;  Location: [facility];  Service: Gynecology;  Laterality: N/A;  . INCISION AND DRAINAGE     chest  . KNEE SURGERY      OB History    Gravida  2   Para      Term      Preterm      AB  2   Living  0     SAB  1   TAB  1   Ectopic      Multiple      Live Births               Home Medications    Prior to Admission medications   Medication Sig Start Date End Date Taking? Authorizing Provider  amoxicillin-clavulanate (AUGMENTIN) 875-125 MG tablet Take 1 tablet by mouth every 12 (twelve) hours. 10/03/20   Hall-Potvin, [place], PA-C  aspirin 81 MG chewable tablet Chew 81 mg by mouth daily.    [provider]  folic acid (FOLVITE) 1 MG tablet Take 1 mg by mouth 4 (four) times daily.    [provider]  ibuprofen (ADVIL) 600 MG tablet Take 1 tablet (600 mg total) by mouth every 6 (six) hours as needed for mild pain or moderate pain. 01/06/20   Antony Madura, PA-C  meloxicam (MOBIC) 7.5 MG tablet Take 1 tablet (7.5 mg total) by mouth daily. 10/03/20   Hall-Potvin, [place], PA-C  Prenatal Vit-Fe Fumarate-FA (PRENATAL VITAMINS PO) Take 1 tablet by mouth daily.    [provider]    Family History Family History  Problem Relation Age of Onset  . Hypertension Mother   . Diabetes Mother   . Stroke Mother   . Cancer Sister   . Diabetes Other   . Healthy Father     Social History Social History   Tobacco Use  . Smoking status: Never Smoker  . Smokeless tobacco: Never Used  Vaping Use  . Vaping Use: Never used  Substance Use Topics  . Alcohol use: No  . Drug use: No     Allergies   Hydrocodone   Review of Systems As per HPI   Physical Exam Triage Vital Signs ED Triage Vitals  Enc Vitals Group     BP 10/03/20 0828 Marland Kitchen)  161/97     Pulse Rate 10/03/20 0828 81     Resp 10/03/20 0828 18     Temp 10/03/20 0828 98.6 F (37 C)     Temp Source 10/03/20 0828 Oral     SpO2 10/03/20 0828 96 %     Weight --      Height --      Head Circumference --      Peak Flow --      Pain Score 10/03/20 0829 7     Pain Loc --      Pain Edu? --      Excl. in GC? --    No data found.  Updated Vital Signs BP (!) 161/97 (BP Location: Left Arm)   Pulse 81   Temp 98.6 F (37 C) (Oral)   Resp 18   SpO2 96%   Visual Acuity Right Eye Distance:   Left Eye Distance:   Bilateral Distance:    Right Eye Near:   Left Eye Near:    Bilateral Near:     Physical Exam Constitutional:      General: She is not in acute distress. HENT:     Head: Normocephalic and atraumatic.  Eyes:     General: No scleral icterus.    Pupils: Pupils are equal, round, and reactive to light.  Cardiovascular:     Rate and Rhythm: Normal rate.  Pulmonary:     Effort: Pulmonary effort is normal.    Chest:     Breasts: Tanner Score is 5.        Right: Normal.        Left: Normal.    Skin:    Coloration: Skin is not jaundiced or pale.  Neurological:     Mental Status: She is alert and oriented to person, place, and time.      UC Treatments / Results  Labs (all labs ordered are listed, but only abnormal results are displayed) Labs Reviewed - No data to display  EKG   Radiology No results found.  Procedures Procedures (including critical care time)  Medications Ordered in UC Medications - No data to display  Initial Impression / Assessment and Plan / UC Course  I have reviewed the triage vital signs and the nursing notes.  Pertinent labs & imaging results that were available during my care of the patient were reviewed by me and considered in my medical decision making (see chart for details).     No focal abscess for I&D: We will trial Augmentin, continue hot compresses.  Return precautions discussed, pt verbalized understanding and is agreeable to plan. Final Clinical Impressions(s) / UC Diagnoses   Final diagnoses:  Abscess of right breast     Discharge Instructions     Keep area(s) clean and dry. Apply hot compress / towel for 5-10 minutes 3-5 times daily. Take antibiotic as prescribed with food - important to complete course. Return for worsening pain, redness, swelling, discharge, fever.  Helpful prevention tips: Keep nails short to avoid secondary skin infections. Use new, clean razors when shaving. Avoid antiperspirants - look for deodorants without aluminum. Avoid wearing underwire bras as this can irritate the area further.     ED Prescriptions    Medication Sig Dispense Auth. Provider   meloxicam (MOBIC) 7.5 MG tablet Take 1 tablet (7.5 mg total) by mouth daily. 15 tablet Hall-Potvin, [place], PA-C   amoxicillin-clavulanate (AUGMENTIN) 875-125 MG tablet Take 1 tablet by mouth every 12 (twelve) hours. 14 tablet Hall-Potvin,  [place], PA-C      PDMP not reviewed this encounter.   Hall-Potvin, [place], [place] 10/03/20 [phone]

## 2020-10-11 ENCOUNTER — Emergency Department (HOSPITAL_COMMUNITY)
Admission: EM | Admit: 2020-10-11 | Discharge: 2020-10-11 | Disposition: A | Discharge status: Home / Self Care | Payer: BC Managed Care – PPO | Attending: Emergency Medicine | Admitting: Emergency Medicine

## 2020-10-11 ENCOUNTER — Encounter (HOSPITAL_COMMUNITY): Payer: Self-pay | Admitting: Emergency Medicine

## 2020-10-11 DIAGNOSIS — N611 Abscess of the breast and nipple: Secondary | ICD-10-CM

## 2020-10-11 DIAGNOSIS — Z7982 Long term (current) use of aspirin: Secondary | ICD-10-CM | POA: Insufficient documentation

## 2020-10-11 DIAGNOSIS — N644 Mastodynia: Secondary | ICD-10-CM | POA: Diagnosis not present

## 2020-10-11 MED ORDER — LIDOCAINE-EPINEPHRINE (PF) 2 %-1:200000 IJ SOLN
10.0000 mL | Freq: Once | INTRAMUSCULAR | Status: AC
  Administered 2020-10-11: 10 mL
  Filled 2020-10-11: qty 10

## 2020-10-11 MED ORDER — LIDOCAINE-EPINEPHRINE (PF) 2 %-1:200000 IJ SOLN: 10.0000 mL | Freq: Once | INTRAMUSCULAR | Status: DC

## 2020-10-11 NOTE — ED Triage Notes (Signed)
Pt reports abscess under R breast x2 weeks. States that she has been on antibiotics for the last week without improvement. Reports redness and swelling, but denies discharge or fever.

## 2020-10-11 NOTE — ED Notes (Signed)
This Clinical research associate present, serving as chaperone, during right breast exam by ED PA Mia M.

## 2020-10-11 NOTE — ED Provider Notes (Signed)
[facility]-EMERGENCY DEPT Provider Note   CSN: [id number] Arrival date & time: 10/11/20  0010     History Chief Complaint  Patient presents with  . Abscess    Shannon Francis is a 29 y.o. female with history of obesity who presents the emergency department with a chief complaint of abscess.  The patient reports constant, worsening redness, pain, and swelling below her right breast for the last 2 weeks.  She reports a history of similar with a breast abscess that she had to have incised and drained in the emergency department.  She was seen for the same last week in urgent care and was started on Augmentin, which she has been taking as prescribed without improvement in her symptoms.  She reports that she waited too long to have the last abscess drained and ended up developing a fever.  She is concerned that symptoms are worsening despite taking antibiotics and came to the ER for evaluation.  She denies fever, chills, myalgias, shortness of breath, chest pain, nipple discharge, abdominal pain, nausea, vomiting, or diarrhea.  She has been taking meloxicam for pain without improvement in her symptoms.  No history of diabetes mellitus.  The history is provided by the patient and medical records. No language interpreter was used.       Past Medical History:  Diagnosis Date  . Headache   . Medical history non-contributory   . Obesity     Patient Active Problem List   Diagnosis Date Noted  . EAR PAIN, LEFT 10/15/2007  . INFECTION, HUMAN PAPILLOMAVIRUS 06/10/2007  . HYPERTROPHY, BREAST 05/10/2007  . EXCESSIVE MENSTRUATION 04/01/2007  . OBESITY, NOS 02/07/2007  . ACNE 02/07/2007  . KNEE MENISCUS INJURY, UNSPECIFIED 02/07/2007    Past Surgical History:  Procedure Laterality Date  . ANTERIOR CRUCIATE LIGAMENT REPAIR     left  . DILATATION & CURETTAGE/HYSTEROSCOPY WITH MYOSURE N/A 08/13/2018   Procedure: DILATATION & CURETTAGE/HYSTEROSCOPY WITH MYOSURE;  Surgeon:  Olivia Mackie, MD;  Location: [facility];  Service: Gynecology;  Laterality: N/A;  . INCISION AND DRAINAGE     chest  . KNEE SURGERY       OB History    Gravida  2   Para      Term      Preterm      AB  2   Living  0     SAB  1   TAB  1   Ectopic      Multiple      Live Births              Family History  Problem Relation Age of Onset  . Hypertension Mother   . Diabetes Mother   . Stroke Mother   . Cancer Sister   . Diabetes Other   . Healthy Father     Social History   Tobacco Use  . Smoking status: Never Smoker  . Smokeless tobacco: Never Used  Vaping Use  . Vaping Use: Never used  Substance Use Topics  . Alcohol use: No  . Drug use: No    Home Medications Prior to Admission medications   Medication Sig Start Date End Date Taking? Authorizing Provider  amoxicillin-clavulanate (AUGMENTIN) 875-125 MG tablet Take 1 tablet by mouth every 12 (twelve) hours. 10/03/20   Hall-Potvin, [place], PA-C  aspirin 81 MG chewable tablet Chew 81 mg by mouth daily.    [provider]  folic acid (FOLVITE) 1 MG tablet Take 1 mg by  mouth 4 (four) times daily.    [provider]  ibuprofen (ADVIL) 600 MG tablet Take 1 tablet (600 mg total) by mouth every 6 (six) hours as needed for mild pain or moderate pain. 01/06/20   Antony Madura, PA-C  meloxicam (MOBIC) 7.5 MG tablet Take 1 tablet (7.5 mg total) by mouth daily. 10/03/20   Hall-Potvin, [place], PA-C  Prenatal Vit-Fe Fumarate-FA (PRENATAL VITAMINS PO) Take 1 tablet by mouth daily.    [provider]    Allergies    Hydrocodone  Review of Systems   Review of Systems  Constitutional: Negative for activity change, chills and fever.  Respiratory: Negative for shortness of breath and wheezing.   Cardiovascular: Negative for chest pain and palpitations.  Gastrointestinal: Negative for abdominal pain, diarrhea, nausea and vomiting.  Genitourinary: Negative for dysuria.  Musculoskeletal:  Negative for back pain.  Skin: Positive for color change and wound. Negative for rash.  Allergic/Immunologic: Negative for immunocompromised state.  Neurological: Negative for syncope, weakness and headaches.  Psychiatric/Behavioral: Negative for confusion.    Physical Exam Updated Vital Signs BP (!) 150/82 (BP Location: Right Arm)   Pulse 69   Temp 98.3 F (36.8 C) (Oral)   Resp 18   SpO2 99%   Physical Exam Vitals and nursing note reviewed.  Constitutional:      General: She is not in acute distress. HENT:     Head: Normocephalic.  Eyes:     Conjunctiva/sclera: Conjunctivae normal.  Cardiovascular:     Rate and Rhythm: Normal rate and regular rhythm.     Heart sounds: No murmur heard.  No friction rub. No gallop.   Pulmonary:     Effort: Pulmonary effort is normal. No respiratory distress.     Breath sounds: No stridor. No wheezing, rhonchi or rales.  Chest:     Chest wall: No tenderness.    Abdominal:     General: There is no distension.     Palpations: Abdomen is soft. There is no mass.     Tenderness: There is no abdominal tenderness. There is no right CVA tenderness, left CVA tenderness, guarding or rebound.     Hernia: No hernia is present.  Musculoskeletal:     Cervical back: Neck supple.     Right lower leg: No edema.     Left lower leg: No edema.  Skin:    General: Skin is warm.     Findings: No rash.  Neurological:     Mental Status: She is alert.  Psychiatric:        Behavior: Behavior normal.     ED Results / Procedures / Treatments   Labs (all labs ordered are listed, but only abnormal results are displayed) Labs Reviewed - No data to display  EKG None  Radiology No results found.  Procedures .Marland KitchenIncision and Drainage  Date/Time: 10/11/2020 4:56 AM Performed by: Barkley Boards, PA-C Authorized by: Barkley Boards, PA-C   Consent:    Consent obtained:  Verbal   Risks discussed:  Bleeding, incomplete drainage and infection    Alternatives discussed:  No treatment Location:    Type:  Abscess   Size:  5   Location: right breast  Pre-procedure details:    Skin preparation:  Betadine Anesthesia (see MAR for exact dosages):    Anesthesia method:  Local infiltration   Local anesthetic:  Lidocaine 2% WITH epi Procedure type:    Complexity:  Simple Procedure details:    Needle aspiration: no  Incision types:  Single straight   Scalpel blade:  10   Wound management:  Probed and deloculated   Drainage:  Bloody   Drainage amount:  Moderate   Packing materials:  None Post-procedure details:    Patient tolerance of procedure:  Tolerated well, no immediate complications Ultrasound ED Soft Tissue  Date/Time: 10/11/2020 4:57 AM Performed by: Barkley Boards, PA-C Authorized by: Barkley Boards, PA-C   Procedure details:    Indications: localization of abscess     Transverse view:  Visualized   Longitudinal view:  Visualized   Images: archived     Limitations:  Body habitus Location:    Location: breast     Side:  Right Findings:     abscess present    no cellulitis present    no foreign body present   (including critical care time)  Medications Ordered in ED Medications  lidocaine-EPINEPHrine (XYLOCAINE W/EPI) 2 %-1:200000 (PF) injection 10 mL (10 mLs Infiltration Given 10/11/20 0447)    ED Course  I have reviewed the triage vital signs and the nursing notes.  Pertinent labs & imaging results that were available during my care of the patient were reviewed by me and considered in my medical decision making (see chart for details).    MDM Rules/Calculators/A&P                          29 year old female presenting with a breast abscess on the right side for the last 2 weeks.  She attempted management with Augmentin for the last week after she was seen in urgent care with no improvement in her symptoms.  She has no constitutional symptoms.  She is afebrile in the ER.  Bedside ultrasound was  performed for localization of abscess, given that location was at the very inferior portion of the right breast and the very superior portion of the right upper abdominal wall.  Risk of the procedure including numbness, incomplete drainage, or cosmetic concerns were discussed prior to initiation of procedure.  Following I&D, patient reported significant improvement in her pain.  There was significant reduction in induration, fluctuance, and redness.  Wound was dressed in the ER.  Given significant improvement, I do not think that she needs any additional antibiotics at this time.  I will give her a referral to the breast center for follow-up if symptoms do not significantly improve.  ER return precautions given.  She is hemodynamically stable and in no acute distress.  Safe discharge home with outpatient follow-up as indicated.  Final Clinical Impression(s) / ED Diagnoses Final diagnoses:  Abscess of right breast    Rx / DC Orders ED Discharge Orders    None       Romelia Bromell A, PA-C 10/11/20 0459    Molpus, Jonny Ruiz, MD 10/11/20 0510

## 2020-10-11 NOTE — Discharge Instructions (Addendum)
Thank you for allowing me to care for you today in the Emergency Department.   You can continue to take meloxicam for pain.  However, if you decide to not take meloxicam you can take 650 mg of Tylenol or 600 mg of ibuprofen with food every 6 hours for pain.  You can alternate between these 2 medications every 3 hours if your pain returns.  For instance, you can take Tylenol at noon, followed by a dose of ibuprofen at 3, followed by second dose of Tylenol and 6.  Clean the area at least once daily with warm water and soap.  Pat the area dry then apply a gauze dressing to the area.  You may have some drainage from this area over the next few days.  Try to avoid wearing an underwire bra as this may cause worsening pain and friction to the site.  You can also try applying a topical antibiotic such as bacitracin or Neosporin.  After reviewing your Augmentin prescription, I do not think that you need any further antibiotics at this time.  Finish the course of Augmentin as prescribed by urgent care.  If symptoms do not significantly improve in the next 3 to 4 days, you can follow-up with the breast center or primary care.  If you do not have a primary care provider, you can call the number on your discharge paperwork to get established with 1.  Return to the emergency department if you develop significant redness, swelling from the breast, fevers, shortness of breath, or other new, concerning symptoms.

## 2020-11-01 ENCOUNTER — Ambulatory Visit
Admission: RE | Admit: 2020-11-01 | Discharge: 2020-11-01 | Disposition: A | Discharge status: Home / Self Care | Payer: BC Managed Care – PPO | Source: Ambulatory Visit | Attending: Obstetrics & Gynecology | Admitting: Obstetrics & Gynecology

## 2020-11-01 DIAGNOSIS — N971 Female infertility of tubal origin: Secondary | ICD-10-CM | POA: Diagnosis not present

## 2020-11-01 DIAGNOSIS — N979 Female infertility, unspecified: Secondary | ICD-10-CM

## 2020-12-16 DIAGNOSIS — Z20822 Contact with and (suspected) exposure to covid-19: Secondary | ICD-10-CM | POA: Diagnosis not present

## 2021-01-17 DIAGNOSIS — N912 Amenorrhea, unspecified: Secondary | ICD-10-CM | POA: Diagnosis not present

## 2021-01-28 ENCOUNTER — Inpatient Hospital Stay (HOSPITAL_COMMUNITY): Payer: Medicaid Other

## 2021-01-28 ENCOUNTER — Inpatient Hospital Stay (HOSPITAL_COMMUNITY)
Admission: AD | Admit: 2021-01-28 | Discharge: 2021-01-28 | Disposition: A | Discharge status: Home / Self Care | Payer: Medicaid Other | Attending: Obstetrics and Gynecology | Admitting: Obstetrics and Gynecology

## 2021-01-28 ENCOUNTER — Encounter (HOSPITAL_COMMUNITY): Payer: Self-pay | Admitting: Obstetrics and Gynecology

## 2021-01-28 ENCOUNTER — Other Ambulatory Visit: Payer: Self-pay

## 2021-01-28 DIAGNOSIS — O99891 Other specified diseases and conditions complicating pregnancy: Secondary | ICD-10-CM

## 2021-01-28 DIAGNOSIS — Z7982 Long term (current) use of aspirin: Secondary | ICD-10-CM | POA: Diagnosis not present

## 2021-01-28 DIAGNOSIS — R030 Elevated blood-pressure reading, without diagnosis of hypertension: Secondary | ICD-10-CM | POA: Diagnosis not present

## 2021-01-28 DIAGNOSIS — O99511 Diseases of the respiratory system complicating pregnancy, first trimester: Secondary | ICD-10-CM

## 2021-01-28 DIAGNOSIS — A599 Trichomoniasis, unspecified: Secondary | ICD-10-CM

## 2021-01-28 DIAGNOSIS — R1030 Lower abdominal pain, unspecified: Secondary | ICD-10-CM

## 2021-01-28 DIAGNOSIS — R109 Unspecified abdominal pain: Secondary | ICD-10-CM

## 2021-01-28 DIAGNOSIS — Z3A01 Less than 8 weeks gestation of pregnancy: Secondary | ICD-10-CM

## 2021-01-28 DIAGNOSIS — J302 Other seasonal allergic rhinitis: Secondary | ICD-10-CM

## 2021-01-28 DIAGNOSIS — O26891 Other specified pregnancy related conditions, first trimester: Secondary | ICD-10-CM

## 2021-01-28 DIAGNOSIS — O26899 Other specified pregnancy related conditions, unspecified trimester: Secondary | ICD-10-CM

## 2021-01-28 LAB — CBC
HCT: 39.6 % (ref 36.0–46.0)
Hemoglobin: 12.8 g/dL (ref 12.0–15.0)
MCH: 26.6 pg (ref 26.0–34.0)
MCHC: 32.3 g/dL (ref 30.0–36.0)
MCV: 82.2 fL (ref 80.0–100.0)
Platelets: 300 10*3/uL (ref 150–400)
RBC: 4.82 MIL/uL (ref 3.87–5.11)
RDW: 15.1 % (ref 11.5–15.5)
WBC: 10.3 10*3/uL (ref 4.0–10.5)
nRBC: 0 % (ref 0.0–0.2)

## 2021-01-28 LAB — HCG, QUANTITATIVE, PREGNANCY: hCG, Beta Chain, Quant, S: 15736 m[IU]/mL — ABNORMAL HIGH (ref <5)

## 2021-01-28 LAB — URINALYSIS, ROUTINE W REFLEX MICROSCOPIC
Bilirubin Urine: NEGATIVE
Glucose, UA: NEGATIVE mg/dL
Hgb urine dipstick: NEGATIVE
Ketones, ur: NEGATIVE mg/dL
Leukocytes,Ua: NEGATIVE
Nitrite: NEGATIVE
Protein, ur: NEGATIVE mg/dL
Specific Gravity, Urine: 1.025 (ref 1.005–1.030)
pH: 5 (ref 5.0–8.0)

## 2021-01-28 LAB — WET PREP, GENITAL
Sperm: NONE SEEN
Yeast Wet Prep HPF POC: NONE SEEN

## 2021-01-28 LAB — POCT PREGNANCY, URINE: Preg Test, Ur: POSITIVE — AB

## 2021-01-28 MED ORDER — LORATADINE 10 MG PO TABS
10.0000 mg | ORAL_TABLET | Freq: Every day | ORAL | Status: DC
  Administered 2021-01-28: 10 mg via ORAL
  Filled 2021-01-28: qty 1

## 2021-01-28 MED ORDER — LORATADINE 10 MG PO TABS: 10.0000 mg | ORAL_TABLET | Freq: Every day | ORAL | 2 refills | Status: DC

## 2021-01-28 MED ORDER — METRONIDAZOLE 500 MG PO TABS
2000.0000 mg | ORAL_TABLET | Freq: Once | ORAL | Status: AC
  Administered 2021-01-28: 2000 mg via ORAL
  Filled 2021-01-28: qty 4

## 2021-01-28 NOTE — MAU Note (Signed)
Patient reports cramping for the past 3 days.  States she is [redacted] weeks pregnant.  LMP 1/7.  Denies any VB.  Also having mucousy drainage in her throat for the past month when she wakes up.  Denies cough/fever/other respiratory symptoms.

## 2021-01-28 NOTE — Discharge Instructions (Signed)
Obstetrics: Normal and Problem Pregnancies (7th ed., pp. 102-121). [place], [place]: Elsevier."> Textbook of Family Medicine (9th ed., pp. [phone]). [place], [place]: Elsevier [employer].">  First Trimester of Pregnancy  The first trimester of pregnancy starts on the first day of your last menstrual period until the end of week 12. This is months 1 through 3 of pregnancy. A week after a sperm fertilizes an egg, the egg will implant into the wall of the uterus and begin to develop into a baby. By the end of 12 weeks, all the baby's organs will be formed and the baby will be 2-3 inches in size. Body changes during your first trimester Your body goes through many changes during pregnancy. The changes vary and generally return to normal after your baby is born. Physical changes  You may gain or lose weight.  Your breasts may begin to grow larger and become tender. The tissue that surrounds your nipples (areola) may become darker.  Dark spots or blotches (chloasma or mask of pregnancy) may develop on your face.  You may have changes in your hair. These can include thickening or thinning of your hair or changes in texture. Health changes  You may feel nauseous, and you may vomit.  You may have heartburn.  You may develop headaches.  You may develop constipation.  Your gums may bleed and may be sensitive to brushing and flossing. Other changes  You may tire easily.  You may urinate more often.  Your menstrual periods will stop.  You may have a loss of appetite.  You may develop cravings for certain kinds of food.  You may have changes in your emotions from day to day.  You may have more vivid and strange dreams. Follow these instructions at home: Medicines  Follow your health care provider's instructions regarding medicine use. Specific medicines may be either safe or unsafe to take during pregnancy. Do not take any medicines unless told to by your health care provider.  Take a  prenatal vitamin that contains at least 600 micrograms (mcg) of folic acid. Eating and drinking  Eat a healthy diet that includes fresh fruits and vegetables, whole grains, good sources of protein such as meat, eggs, or tofu, and low-fat dairy products.  Avoid raw meat and unpasteurized juice, milk, and cheese. These carry germs that can harm you and your baby.  If you feel nauseous or you vomit: ? Eat 4 or 5 small meals a day instead of 3 large meals. ? Try eating a few soda crackers. ? Drink liquids between meals instead of during meals.  You may need to take these actions to prevent or treat constipation: ? Drink enough fluid to keep your urine pale yellow. ? Eat foods that are high in fiber, such as beans, whole grains, and fresh fruits and vegetables. ? Limit foods that are high in fat and processed sugars, such as fried or sweet foods. Activity  Exercise only as directed by your health care provider. Most people can continue their usual exercise routine during pregnancy. Try to exercise for 30 minutes at least 5 days a week.  Stop exercising if you develop pain or cramping in the lower abdomen or lower back.  Avoid exercising if it is very hot or humid or if you are at high altitude.  Avoid heavy lifting.  If you choose to, you may have sex unless your health care provider tells you not to. Relieving pain and discomfort  Wear a good support bra to relieve breast  tenderness.  Rest with your legs elevated if you have leg cramps or low back pain.  If you develop bulging veins (varicose veins) in your legs: ? Wear support hose as told by your health care provider. ? Elevate your feet for 15 minutes, 3-4 times a day. ? Limit salt in your diet. Safety  Wear your seat belt at all times when driving or riding in a car.  Talk with your health care provider if someone is verbally or physically abusive to you.  Talk with your health care provider if you are feeling sad or have  thoughts of hurting yourself. Lifestyle  Do not use hot tubs, steam rooms, or saunas.  Do not douche. Do not use tampons or scented sanitary pads.  Do not use herbal remedies, alcohol, illegal drugs, or medicines that are not approved by your health care provider. Chemicals in these products can harm your baby.  Do not use any products that contain nicotine or tobacco, such as cigarettes, e-cigarettes, and chewing tobacco. If you need help quitting, ask your health care provider.  Avoid cat litter boxes and soil used by cats. These carry germs that can cause birth defects in the baby and possibly loss of the unborn baby (fetus) by miscarriage or stillbirth. General instructions  During routine prenatal visits in the first trimester, your health care provider will do a physical exam, perform necessary tests, and ask you how things are going. Keep all follow-up visits. This is important.  Ask for help if you have counseling or nutritional needs during pregnancy. Your health care provider can offer advice or refer you to specialists for help with various needs.  Schedule a dentist appointment. At home, brush your teeth with a soft toothbrush. Floss gently.  Write down your questions. Take them to your prenatal visits. Where to find more information  American Pregnancy Association: americanpregnancy.org  [employer] of [employer]st[employer]ians and Gynecologists: https://www.todd-brady.net/  Office on [employer] Health: Mighty[employer].nz Contact a health care provider if you have:  Dizziness.  A fever.  Mild pelvic cramps, pelvic pressure, or nagging pain in the abdominal area.  Nausea, vomiting, or diarrhea that lasts for 24 hours or longer.  A bad-smelling vaginal discharge.  Pain when you urinate.  Known exposure to a contagious illness, such as chickenpox, measles, Zika virus, HIV, or hepatitis. Get help right away if you have:  Spotting or bleeding from your  vagina.  Severe abdominal cramping or pain.  Shortness of breath or chest pain.  Any kind of trauma, such as from a fall or a car crash.  New or increased pain, swelling, or redness in an arm or leg. Summary  The first trimester of pregnancy starts on the first day of your last menstrual period until the end of week 12 (months 1 through 3).  Eating 4 or 5 small meals a day rather than 3 large meals may help to relieve nausea and vomiting.  Do not use any products that contain nicotine or tobacco, such as cigarettes, e-cigarettes, and chewing tobacco. If you need help quitting, ask your health care provider.  Keep all follow-up visits. This is important. This information is not intended to replace advice given to you by your health care provider. Make sure you discuss any questions you have with your health care provider. Document Revised: 05/05/2020 Document Reviewed: 03/11/2020 Elsevier Patient Education  2021 Elsevier Inc. Trichomoniasis Trichomoniasis is an STI (sexually transmitted infection) that can affect both women and men. In women, the outer  area of the female genitalia (vulva) and the vagina are affected. In men, mainly the penis is affected, but the prostate and other reproductive organs can also be involved.  This condition can be treated with medicine. It often has no symptoms (is asymptomatic), especially in men. If not treated, trichomoniasis can last for months or years. What are the causes? This condition is caused by a parasite called Trichomonas vaginalis. Trichomoniasis most often spreads from person to person (is contagious) through sexual contact. What increases the risk? The following factors may make you more likely to develop this condition:  Having unprotected sex.  Having sex with a partner who has trichomoniasis.  Having multiple sexual partners.  Having had previous trichomoniasis infections or other STIs. What are the signs or symptoms? In women,  symptoms of trichomoniasis include:  Abnormal vaginal discharge that is clear, white, gray, or yellow-green and foamy and has an unusual "fishy" odor.  Itching and irritation of the vagina and vulva.  Burning or pain during urination or sex.  Redness and swelling of the genitals. In men, symptoms of trichomoniasis include:  Penile discharge that may be foamy or contain pus.  Pain in the penis. This may happen only when urinating.  Itching or irritation inside the penis.  Burning after urination or ejaculation. How is this diagnosed? In women, this condition may be found during a routine Pap test or physical exam. It may be found in men during a routine physical exam. Your health care provider may do tests to help diagnose this infection, such as:  Urine tests (men and women).  The following in women: ? Testing the pH of the vagina. ? A vaginal swab test that checks for the Trichomonas vaginalis parasite. ? Testing vaginal secretions. Your health care provider may test you for other STIs, including HIV (human immunodeficiency virus). How is this treated? This condition is treated with medicine taken by mouth (orally), such as metronidazole or tinidazole, to fight the infection. Your sexual partner(s) also need to be tested and treated.  If you are a woman and you plan to become pregnant or think you may be pregnant, tell your health care provider right away. Some medicines that are used to treat the infection should not be taken during pregnancy. Your health care provider may recommend over-the-counter medicines or creams to help relieve itching or irritation. You may be tested for infection again 3 months after treatment.   Follow these instructions at home:  Take and use over-the-counter and prescription medicines, including creams, only as told by your health care provider.  Take your antibiotic medicine as told by your health care provider. Do not stop taking the antibiotic  even if you start to feel better.  Do not have sex until 7-10 days after you finish your medicine, or until your health care provider approves. Ask your health care provider when you may start to have sex again.  (Women) Do not douche or wear tampons while you have the infection.  Discuss your infection with your sexual partner(s). Make sure that your partner gets tested and treated, if necessary.  Keep all follow-up visits as told by your health care provider. This is important. How is this prevented?  Use condoms every time you have sex. Using condoms correctly and consistently can help protect against STIs.  Avoid having multiple sexual partners.  Talk with your sexual partner about any symptoms that either of you may have, as well as any history of STIs.  Get tested  for STIs and STDs (sexually transmitted diseases) before you have sex. Ask your partner to do the same.  Do not have sexual contact if you have symptoms of trichomoniasis or another STI.   Contact a health care provider if:  You still have symptoms after you finish your medicine.  You develop pain in your abdomen.  You have pain when you urinate.  You have bleeding after sex.  You develop a rash.  You feel nauseous or you vomit.  You plan to become pregnant or think you may be pregnant. Summary  Trichomoniasis is an STI (sexually transmitted infection) that can affect both women and men.  This condition often has no symptoms (is asymptomatic), especially in men.  Without treatment, this condition can last for months or years.  You should not have sex until 7-10 days after you finish your medicine, or until your health care provider approves. Ask your health care provider when you may start to have sex again.  Discuss your infection with your sexual partner(s). Make sure that your partner gets tested and treated, if necessary. This information is not intended to replace advice given to you by your health  care provider. Make sure you discuss any questions you have with your health care provider. Document Revised: 09/10/2018 Document Reviewed: 09/10/2018 Elsevier Patient Education  2021 [employer].

## 2021-01-28 NOTE — MAU Provider Note (Signed)
History     CSN: [id number]  Arrival date and time: 01/28/21 1853   Event Date/Time   First Provider Initiated Contact with Patient 01/28/21 2044      Chief Complaint  Patient presents with  . Abdominal Pain   Shannon Francis is a 30 y.o. G3P0020 at [redacted]w[redacted]d by Definite LMP who receives care at [facility].  She presents today for Abdominal Pain.  She states she started having cramping and pressure about 3 days ago that is intermittent in nature.  She states the pain is located bilaterally, but does not occur at the same time. She states the pain lasts a few minutes and then goes away.  She has not taken anything for the pain and states there are no aggravating or relieving factors.  Patient also reports waking up with "mucous on my throat" as well as runny nose and eyes throughout the day.  Patient reports she was taking mucinex, but discontinued upon discovery of pregnancy. Patient endorses a history of elevated blood pressures, but no formal treatment.     OB History    Gravida  3   Para      Term      Preterm      AB  2   Living  0     SAB  1   IAB  1   Ectopic      Multiple      Live Births              Past Medical History:  Diagnosis Date  . Headache   . Medical history non-contributory   . Obesity     Past Surgical History:  Procedure Laterality Date  . ANTERIOR CRUCIATE LIGAMENT REPAIR     left  . DILATATION & CURETTAGE/HYSTEROSCOPY WITH MYOSURE N/A 08/13/2018   Procedure: DILATATION & CURETTAGE/HYSTEROSCOPY WITH MYOSURE;  Surgeon: Olivia Mackie, MD;  Location: [facility];  Service: Gynecology;  Laterality: N/A;  . INCISION AND DRAINAGE     chest  . KNEE SURGERY      Family History  Problem Relation Age of Onset  . Hypertension Mother   . Diabetes Mother   . Stroke Mother   . Cancer Sister   . Diabetes Other   . Healthy Father     Social History   Tobacco Use  . Smoking status: Never Smoker  . Smokeless tobacco: Never Used  Vaping Use  .  Vaping Use: Never used  Substance Use Topics  . Alcohol use: No  . Drug use: No    Allergies:  Allergies  Allergen Reactions  . Hydrocodone Nausea And Vomiting and Anaphylaxis    Medications Prior to Admission  Medication Sig Dispense Refill Last Dose  . Prenatal Vit-Fe Fumarate-FA (PRENATAL VITAMINS PO) Take 1 tablet by mouth daily.   01/28/2021 at Unknown time  . progesterone (ENDOMETRIN) 100 MG vaginal insert Place 100 mg vaginally 1 day or 1 dose.   01/27/2021 at Unknown time  . amoxicillin-clavulanate (AUGMENTIN) 875-125 MG tablet Take 1 tablet by mouth every 12 (twelve) hours. 14 tablet 0   . aspirin 81 MG chewable tablet Chew 81 mg by mouth daily.     . folic acid (FOLVITE) 1 MG tablet Take 1 mg by mouth 4 (four) times daily.     Marland Kitchen ibuprofen (ADVIL) 600 MG tablet Take 1 tablet (600 mg total) by mouth every 6 (six) hours as needed for mild pain or moderate pain. 30 tablet 0   . meloxicam (  MOBIC) 7.5 MG tablet Take 1 tablet (7.5 mg total) by mouth daily. 15 tablet 0     Review of Systems  Constitutional: Negative for chills and fever.  Respiratory: Negative for cough and shortness of breath.   Gastrointestinal: Positive for abdominal pain. Negative for constipation, diarrhea, nausea and vomiting.  Genitourinary: Negative for difficulty urinating, dysuria, vaginal bleeding and vaginal discharge.  Neurological: Negative for dizziness, light-headedness and headaches.   Physical Exam   Blood pressure 138/82, pulse 91, temperature 98.7 F (37.1 C), resp. rate 19, weight (!) 173.4 kg, last menstrual period 12/17/2020.  Physical Exam Vitals and nursing note reviewed.  Constitutional:      General: She is not in acute distress.    Appearance: She is well-developed. She is obese. She is not ill-appearing.  HENT:     Head: Normocephalic and atraumatic.  Cardiovascular:     Rate and Rhythm: Normal rate and regular rhythm.     Heart sounds: Normal heart sounds.  Pulmonary:      Effort: Pulmonary effort is normal. No respiratory distress.     Breath sounds: Normal breath sounds.  Abdominal:     General: Bowel sounds are normal.     Tenderness: There is no abdominal tenderness.  Skin:    General: Skin is warm and dry.  Neurological:     Mental Status: She is alert and oriented to person, place, and time.  Psychiatric:        Mood and Affect: Mood normal.        Behavior: Behavior normal.     MAU Course  Procedures Results for orders placed or performed during the hospital encounter of 01/28/21 (from the past 24 hour(s))  Urinalysis, Routine w reflex microscopic     Status: Abnormal   Collection Time: 01/28/21  7:27 PM  Result Value Ref Range   Color, Urine YELLOW YELLOW   APPearance HAZY (A) CLEAR   Specific Gravity, Urine 1.025 1.005 - 1.030   pH 5.0 5.0 - 8.0   Glucose, UA NEGATIVE NEGATIVE mg/dL   Hgb urine dipstick NEGATIVE NEGATIVE   Bilirubin Urine NEGATIVE NEGATIVE   Ketones, ur NEGATIVE NEGATIVE mg/dL   Protein, ur NEGATIVE NEGATIVE mg/dL   Nitrite NEGATIVE NEGATIVE   Leukocytes,Ua NEGATIVE NEGATIVE  Pregnancy, urine POC     Status: Abnormal   Collection Time: 01/28/21  7:28 PM  Result Value Ref Range   Preg Test, Ur POSITIVE (A) NEGATIVE  CBC     Status: None   Collection Time: 01/28/21  9:12 PM  Result Value Ref Range   WBC 10.3 4.0 - 10.5 K/uL   RBC 4.82 3.87 - 5.11 MIL/uL   Hemoglobin 12.8 12.0 - 15.0 g/dL   HCT 40.9 81.1 - 91.4 %   MCV 82.2 80.0 - 100.0 fL   MCH 26.6 26.0 - 34.0 pg   MCHC 32.3 30.0 - 36.0 g/dL   RDW 78.2 95.6 - 21.3 %   Platelets 300 150 - 400 K/uL   nRBC 0.0 0.0 - 0.2 %  Wet prep, genital     Status: Abnormal   Collection Time: 01/28/21  9:40 PM   Specimen: Vaginal  Result Value Ref Range   Yeast Wet Prep HPF POC NONE SEEN NONE SEEN   Trich, Wet Prep PRESENT (A) NONE SEEN   Clue Cells Wet Prep HPF POC PRESENT (A) NONE SEEN   WBC, Wet Prep HPF POC MANY (A) NONE SEEN   Sperm NONE SEEN    US  OB LESS THAN  14 WEEKS WITH OB TRANSVAGINAL  Result Date: 01/28/2021 CLINICAL DATA:  Cramping for 3 days EXAM: OBSTETRIC <14 WK [place] AND TRANSVAGINAL OB US TECHNIQUE: Both transabdominal and transvaginal ultrasound examinations were performed for complete evaluation of the gestation as well as the maternal uterus, adnexal regions, and pelvic cul-de-sac. Transvaginal technique was performed to assess early pregnancy. COMPARISON:  Obstetrical ultrasound from prior gestation 02/12/2019 FINDINGS: Intrauterine gestational sac: Single Yolk sac:  Visualized. Embryo:  Visualized. Cardiac Activity: Visualized. Heart Rate: 91 bpm CRL:  2.7 mm   5 w   5 d                  [place] EDC: 09/25/2021 Subchorionic hemorrhage:  None visualized. Maternal uterus/adnexae: Anteverted maternal uterus. Small to moderate volume anechoic free fluid in the pelvis is nonspecific. Ovaries are unremarkable. Technically challenging examination given patient body habitus. IMPRESSION: Technically challenging examination given patient body habitus. Single intrauterine gestation at 5 weeks, 5 days by crown-rump length sonographic estimation. Borderline fetal bradycardia, nonspecific given the early age of gestation. Could consider follow-up sonography to ensure continued viability in 7-10 days. Electronically Signed   By: Kreg Shropshire M.D.   On: 01/28/2021 21:58    MDM Physical Exam Wet Prep and GC/CT Labs: UA, UPT, CBC, hCG, ABO Ultrasound Antihistamine Trich Treatment Assessment and Plan  31 year old  G3P0020 at 6 weeks by Definite LMP Cramping in setting of PUL Elevated BP Allergies-Seasonal  -POC reviewed. -Exam performed. -Patient given instructions on how to collect swabs. -Discussed labs to be collected. -Informed that symptoms are c/w seasonal allergies. -Discussed treatment with medication today. -Will send for [place] and await results. -Cautioned that early gestation may yield no visualization of uterine contents.   Cherre Robins 01/28/2021, 8:44 PM   Reassessment (10:11 PM) SIUP at 5.5 weeks Trichomoniasis  -[place] images and results reviewed and returns as above -Discussed wet prep findings and need for treatment. -Will treat today. Give Metronidazole 2 grams.  -Provider to bedside to discuss results and need for follow up at office. -Informed of trichomoniasis infection and treatment.  Patient agreeable. -Offered and accepts EPDS and script given for spouse: Shannon Francis 01/05/1993 -Instructed to abstain from sexual activity for at least 10 days after partner is treated.  Informed that primary ob will test for cure at ~3 weeks. -Informed of BV diagnosis and that treatment today would be held.  Educated on symptoms to report, to primary ob, in case treatment becomes necessary. -Patient verbalized understanding and without questions or concerns. -Encouraged to call primary ob or return to MAU if symptoms worsen or with the onset of new symptoms. -Discharged to home in stable condition.  Cherre Robins MSN, CNM Advanced Practice Provider, [facility] [employer]

## 2021-01-31 LAB — GC/CHLAMYDIA PROBE AMP (~~LOC~~) NOT AT ARMC
Chlamydia: NEGATIVE
Comment: NEGATIVE
Comment: NORMAL
Neisseria Gonorrhea: NEGATIVE

## 2021-02-08 DIAGNOSIS — Z113 Encounter for screening for infections with a predominantly sexual mode of transmission: Secondary | ICD-10-CM | POA: Diagnosis not present

## 2021-02-08 DIAGNOSIS — O3680X9 Pregnancy with inconclusive fetal viability, other fetus: Secondary | ICD-10-CM | POA: Diagnosis not present

## 2021-02-08 DIAGNOSIS — Z3A01 Less than 8 weeks gestation of pregnancy: Secondary | ICD-10-CM | POA: Diagnosis not present

## 2021-02-08 LAB — OB RESULTS CONSOLE GC/CHLAMYDIA
Chlamydia: NEGATIVE
Gonorrhea: NEGATIVE

## 2021-02-08 LAB — OB RESULTS CONSOLE ANTIBODY SCREEN: Antibody Screen: NEGATIVE

## 2021-02-08 LAB — OB RESULTS CONSOLE RUBELLA ANTIBODY, IGM: Rubella: IMMUNE

## 2021-02-08 LAB — OB RESULTS CONSOLE HIV ANTIBODY (ROUTINE TESTING): HIV: NONREACTIVE

## 2021-02-08 LAB — OB RESULTS CONSOLE ABO/RH: RH Type: POSITIVE

## 2021-02-08 LAB — OB RESULTS CONSOLE HEPATITIS B SURFACE ANTIGEN: Hepatitis B Surface Ag: NEGATIVE

## 2021-02-08 LAB — OB RESULTS CONSOLE RPR: RPR: NONREACTIVE

## 2021-02-15 DIAGNOSIS — O3680X9 Pregnancy with inconclusive fetal viability, other fetus: Secondary | ICD-10-CM | POA: Diagnosis not present

## 2021-02-15 DIAGNOSIS — Z3A08 8 weeks gestation of pregnancy: Secondary | ICD-10-CM | POA: Diagnosis not present

## 2021-02-15 DIAGNOSIS — Z3490 Encounter for supervision of normal pregnancy, unspecified, unspecified trimester: Secondary | ICD-10-CM | POA: Diagnosis not present

## 2021-02-17 DIAGNOSIS — R7309 Other abnormal glucose: Secondary | ICD-10-CM | POA: Diagnosis not present

## 2021-03-24 ENCOUNTER — Encounter: Payer: Self-pay | Admitting: Obstetrics & Gynecology

## 2021-03-24 DIAGNOSIS — O4102X0 Oligohydramnios, second trimester, not applicable or unspecified: Secondary | ICD-10-CM | POA: Diagnosis not present

## 2021-03-24 DIAGNOSIS — Z3A12 12 weeks gestation of pregnancy: Secondary | ICD-10-CM | POA: Diagnosis not present

## 2021-04-07 ENCOUNTER — Other Ambulatory Visit: Payer: Self-pay

## 2021-04-07 ENCOUNTER — Inpatient Hospital Stay (HOSPITAL_COMMUNITY)
Admission: AD | Admit: 2021-04-07 | Discharge: 2021-04-08 | Disposition: A | Discharge status: Home / Self Care | Payer: Medicaid Other | Attending: Obstetrics and Gynecology | Admitting: Obstetrics and Gynecology

## 2021-04-07 DIAGNOSIS — Z3A16 16 weeks gestation of pregnancy: Secondary | ICD-10-CM | POA: Insufficient documentation

## 2021-04-07 DIAGNOSIS — N949 Unspecified condition associated with female genital organs and menstrual cycle: Secondary | ICD-10-CM

## 2021-04-07 DIAGNOSIS — R102 Pelvic and perineal pain: Secondary | ICD-10-CM | POA: Insufficient documentation

## 2021-04-07 DIAGNOSIS — O26892 Other specified pregnancy related conditions, second trimester: Secondary | ICD-10-CM | POA: Insufficient documentation

## 2021-04-07 DIAGNOSIS — Z885 Allergy status to narcotic agent status: Secondary | ICD-10-CM | POA: Insufficient documentation

## 2021-04-07 DIAGNOSIS — Z3A15 15 weeks gestation of pregnancy: Secondary | ICD-10-CM | POA: Diagnosis not present

## 2021-04-07 DIAGNOSIS — Z7982 Long term (current) use of aspirin: Secondary | ICD-10-CM | POA: Insufficient documentation

## 2021-04-07 LAB — URINALYSIS, ROUTINE W REFLEX MICROSCOPIC
Bilirubin Urine: NEGATIVE
Glucose, UA: NEGATIVE mg/dL
Hgb urine dipstick: NEGATIVE
Ketones, ur: NEGATIVE mg/dL
Leukocytes,Ua: NEGATIVE
Nitrite: NEGATIVE
Protein, ur: NEGATIVE mg/dL
Specific Gravity, Urine: 1.015 (ref 1.005–1.030)
pH: 5 (ref 5.0–8.0)

## 2021-04-07 NOTE — MAU Note (Signed)
PT SAYS HAS PAIN ON LEFT LOWER SIDE - STARTED  Tuesday - NO MEDS.  PNC WITH [facility] - SAW  DR Mora Appl  LAST SEX-  WED

## 2021-04-08 ENCOUNTER — Encounter (HOSPITAL_COMMUNITY): Payer: Self-pay | Admitting: Obstetrics and Gynecology

## 2021-04-08 DIAGNOSIS — Z7982 Long term (current) use of aspirin: Secondary | ICD-10-CM | POA: Diagnosis not present

## 2021-04-08 DIAGNOSIS — Z885 Allergy status to narcotic agent status: Secondary | ICD-10-CM | POA: Diagnosis not present

## 2021-04-08 DIAGNOSIS — O26892 Other specified pregnancy related conditions, second trimester: Secondary | ICD-10-CM

## 2021-04-08 DIAGNOSIS — R109 Unspecified abdominal pain: Secondary | ICD-10-CM

## 2021-04-08 DIAGNOSIS — Z3A16 16 weeks gestation of pregnancy: Secondary | ICD-10-CM | POA: Diagnosis not present

## 2021-04-08 DIAGNOSIS — R102 Pelvic and perineal pain: Secondary | ICD-10-CM | POA: Diagnosis not present

## 2021-04-08 LAB — CBC
HCT: 35.9 % — ABNORMAL LOW (ref 36.0–46.0)
Hemoglobin: 11.6 g/dL — ABNORMAL LOW (ref 12.0–15.0)
MCH: 26.4 pg (ref 26.0–34.0)
MCHC: 32.3 g/dL (ref 30.0–36.0)
MCV: 81.8 fL (ref 80.0–100.0)
Platelets: 286 10*3/uL (ref 150–400)
RBC: 4.39 MIL/uL (ref 3.87–5.11)
RDW: 14.6 % (ref 11.5–15.5)
WBC: 11.8 10*3/uL — ABNORMAL HIGH (ref 4.0–10.5)
nRBC: 0 % (ref 0.0–0.2)

## 2021-04-08 NOTE — Discharge Instructions (Signed)

## 2021-04-08 NOTE — MAU Provider Note (Signed)
Chief Complaint:  Abdominal Pain   Event Date/Time   First Provider Initiated Contact with Patient 04/08/21 0223     HPI: Shannon Francis is a 30 y.o. G3P0020 at 37w0dwho presents to maternity admissions reporting sharp intermittent pain on Left lower abdomen when she turns over onto right side.  The pain then goes away. . She denies LOF, vaginal bleeding, vaginal itching/burning, urinary symptoms, h/a, dizziness, n/v, diarrhea, constipation or fever/chills.    Abdominal Pain This is a new problem. The current episode started in the past 7 days. The problem occurs intermittently. The pain is located in the LLQ. The quality of the pain is sharp. The abdominal pain does not radiate. Pertinent negatives include no constipation, diarrhea, dysuria, fever, frequency or myalgias. The pain is aggravated by certain positions and movement. The pain is relieved by being still. She has tried nothing for the symptoms.    RN Note: PT SAYS HAS PAIN ON LEFT LOWER SIDE - STARTED  Tuesday - NO MEDS.  PNC WITH [facility] - SAW  DR [employer]  LAST SEX-  WED   Past Medical History: Past Medical History:  Diagnosis Date  . Headache   . Medical history non-contributory   . Obesity     Past obstetric history: OB History  Gravida Para Term Preterm AB Living  3       2 0  SAB IAB Ectopic Multiple Live Births  1 1          # Outcome Date GA Lbr Len/2nd Weight Sex Delivery Anes PTL Lv  3 Current           2 SAB 02/2019          1 IAB             Past Surgical History: Past Surgical History:  Procedure Laterality Date  . ANTERIOR CRUCIATE LIGAMENT REPAIR     left  . DILATATION & CURETTAGE/HYSTEROSCOPY WITH MYOSURE N/A 08/13/2018   Procedure: DILATATION & CURETTAGE/HYSTEROSCOPY WITH MYOSURE;  Surgeon: Olivia Mackie, MD;  Location: [facility];  Service: Gynecology;  Laterality: N/A;  . INCISION AND DRAINAGE     chest  . KNEE SURGERY      Family History: Family History  Problem Relation Age of Onset  .  Hypertension Mother   . Diabetes Mother   . Stroke Mother   . Cancer Sister   . Diabetes Other   . Healthy Father     Social History: Social History   Tobacco Use  . Smoking status: Never Smoker  . Smokeless tobacco: Never Used  Vaping Use  . Vaping Use: Never used  Substance Use Topics  . Alcohol use: No  . Drug use: No    Allergies:  Allergies  Allergen Reactions  . Hydrocodone Nausea And Vomiting and Anaphylaxis    Meds:  Medications Prior to Admission  Medication Sig Dispense Refill Last Dose  . aspirin 81 MG chewable tablet Chew 81 mg by mouth daily.   04/07/2021 at Unknown time  . loratadine (CLARITIN) 10 MG tablet Take 1 tablet (10 mg total) by mouth daily. 30 tablet 2 Past Week at Unknown time  . Prenatal Vit-Fe Fumarate-FA (PRENATAL VITAMINS PO) Take 1 tablet by mouth daily.   04/07/2021 at Unknown time  . folic acid (FOLVITE) 1 MG tablet Take 1 mg by mouth 4 (four) times daily.     . progesterone (ENDOMETRIN) 100 MG vaginal insert Place 100 mg vaginally 1 day or 1 dose.  I have reviewed patient's Past Medical Hx, Surgical Hx, Family Hx, Social Hx, medications and allergies.   ROS:  Review of Systems  Constitutional: Negative for fever.  Gastrointestinal: Positive for abdominal pain. Negative for constipation and diarrhea.  Genitourinary: Negative for dysuria and frequency.  Musculoskeletal: Negative for myalgias.   Other systems negative  Physical Exam   Patient Vitals for the past 24 hrs:  BP Temp Temp src Pulse Resp Height Weight  04/08/21 0200 (!) 146/78 99 F (37.2 C) -- (!) 103 -- -- --  04/07/21 2304 (!) 148/64 98.8 F (37.1 C) Oral 99 20 5\' 5"  (1.651 m) (!) 179.4 kg   Constitutional: Well-developed, morbidly obese, pleasant female in no acute distress.  Cardiovascular: normal rate and rhythm Respiratory: normal effort, clear to auscultation bilaterally GI: Abd soft, non-tender except for with deep palpation of left round ligament which  is tender,  Abdomen is gravid,  appropriate for gestational age.   No rebound or guarding. MS: Extremities nontender, no edema, normal ROM Neurologic: Alert and oriented x 4.  GU: Neg CVAT.  PELVIC EXAM: deferred  FHT:  166   Labs: Results for orders placed or performed during the hospital encounter of 04/07/21 (from the past 24 hour(s))  Urinalysis, Routine w reflex microscopic     Status: Abnormal   Collection Time: 04/07/21  9:39 PM  Result Value Ref Range   Color, Urine YELLOW YELLOW   APPearance HAZY (A) CLEAR   Specific Gravity, Urine 1.015 1.005 - 1.030   pH 5.0 5.0 - 8.0   Glucose, UA NEGATIVE NEGATIVE mg/dL   Hgb urine dipstick NEGATIVE NEGATIVE   Bilirubin Urine NEGATIVE NEGATIVE   Ketones, ur NEGATIVE NEGATIVE mg/dL   Protein, ur NEGATIVE NEGATIVE mg/dL   Nitrite NEGATIVE NEGATIVE   Leukocytes,Ua NEGATIVE NEGATIVE  CBC     Status: Abnormal   Collection Time: 04/08/21  1:38 AM  Result Value Ref Range   WBC 11.8 (H) 4.0 - 10.5 K/uL   RBC 4.39 3.87 - 5.11 MIL/uL   Hemoglobin 11.6 (L) 12.0 - 15.0 g/dL   HCT 04/10/21 (L) 58.8 - 50.2 %   MCV 81.8 80.0 - 100.0 fL   MCH 26.4 26.0 - 34.0 pg   MCHC 32.3 30.0 - 36.0 g/dL   RDW 77.4 12.8 - 78.6 %   Platelets 286 150 - 400 K/uL   nRBC 0.0 0.0 - 0.2 %      Imaging:  No results found.  MAU Course/MDM: I have ordered labs and reviewed results. I ordered these to rule out acute pathology as source of pain.  No leukocytosis or signs of UTI.   Discussed physiology and nature of round ligament pain.    Assessment: Single IUP at [redacted]w[redacted]d Left round ligament pain  Plan: Discharge home Advised use pillows for support, slow change of position, this will resolve over time Follow up in Office for prenatal visits   Encouraged to return if she develops worsening of symptoms, increase in pain, fever, or other concerning symptoms.  Pt stable at time of discharge.  [redacted]w[redacted]d CNM, MSN Certified Nurse-Midwife 04/08/2021 2:23  AM

## 2021-04-14 ENCOUNTER — Other Ambulatory Visit: Payer: Self-pay | Admitting: Obstetrics & Gynecology

## 2021-05-05 ENCOUNTER — Encounter: Payer: Self-pay | Admitting: *Deleted

## 2021-05-05 ENCOUNTER — Other Ambulatory Visit: Payer: Self-pay

## 2021-05-05 ENCOUNTER — Other Ambulatory Visit: Payer: Self-pay | Admitting: Obstetrics and Gynecology

## 2021-05-05 ENCOUNTER — Ambulatory Visit: Payer: Medicaid Other | Attending: Obstetrics & Gynecology

## 2021-05-05 ENCOUNTER — Other Ambulatory Visit: Payer: Self-pay | Admitting: Obstetrics & Gynecology

## 2021-05-05 ENCOUNTER — Ambulatory Visit: Payer: Medicaid Other | Admitting: *Deleted

## 2021-05-05 DIAGNOSIS — R638 Other symptoms and signs concerning food and fluid intake: Secondary | ICD-10-CM

## 2021-05-05 DIAGNOSIS — Z3A19 19 weeks gestation of pregnancy: Secondary | ICD-10-CM

## 2021-05-05 DIAGNOSIS — Z363 Encounter for antenatal screening for malformations: Secondary | ICD-10-CM | POA: Diagnosis not present

## 2021-05-06 ENCOUNTER — Other Ambulatory Visit: Payer: Self-pay | Admitting: *Deleted

## 2021-05-06 DIAGNOSIS — Z6841 Body Mass Index (BMI) 40.0 and over, adult: Secondary | ICD-10-CM

## 2021-05-31 ENCOUNTER — Ambulatory Visit
Admission: EM | Admit: 2021-05-31 | Discharge: 2021-05-31 | Disposition: A | Discharge status: Home / Self Care | Payer: Medicaid Other | Attending: Emergency Medicine | Admitting: Emergency Medicine

## 2021-05-31 ENCOUNTER — Other Ambulatory Visit: Payer: Self-pay

## 2021-05-31 DIAGNOSIS — R590 Localized enlarged lymph nodes: Secondary | ICD-10-CM | POA: Diagnosis not present

## 2021-05-31 MED ORDER — AMOXICILLIN 875 MG PO TABS: 875.0000 mg | ORAL_TABLET | Freq: Two times a day (BID) | ORAL | 0 refills | Status: AC

## 2021-05-31 NOTE — Discharge Instructions (Addendum)
Continue warm compresses to neck Tylenol for pain Amoxicillin twice daily for 1 week to cover sinus infection Continue to monitor area on neck, follow-up if not improving or worsening

## 2021-05-31 NOTE — ED Triage Notes (Signed)
Pt states getting over an sinus infection and has had a swollen lymph node to back of her neck for a week.

## 2021-05-31 NOTE — ED Provider Notes (Signed)
[facility]    CSN: [id number] Arrival date & time: 05/31/21  0809      History   Chief Complaint Chief Complaint  Patient presents with   Lymphadenopathy    HPI Shannon Francis is a 30 y.o. female approximately 23 weeks, 4 days pregnant presenting today for evaluation of lymphadenopathy.  Reports over the past week she has had swelling and discomfort at the base of her neck in the middle.  Believes this to be a swollen lymph node, has been applying warm compresses.  She reports associated cough congestion and sinus pressure.  Denies sore throat.  She denies any abdominal pain nausea or vomiting.  Denies history of abscesses.  HPI  Past Medical History:  Diagnosis Date   Headache    Medical history non-contributory    Obesity     Patient Active Problem List   Diagnosis Date Noted   EAR PAIN, LEFT 10/15/2007   INFECTION, HUMAN PAPILLOMAVIRUS 06/10/2007   HYPERTROPHY, BREAST 05/10/2007   EXCESSIVE MENSTRUATION 04/01/2007   OBESITY, NOS 02/07/2007   ACNE 02/07/2007   KNEE MENISCUS INJURY, UNSPECIFIED 02/07/2007    Past Surgical History:  Procedure Laterality Date   ANTERIOR CRUCIATE LIGAMENT REPAIR     left   DILATATION & CURETTAGE/HYSTEROSCOPY WITH MYOSURE N/A 08/13/2018   Procedure: DILATATION & CURETTAGE/HYSTEROSCOPY WITH MYOSURE;  Surgeon: Olivia Mackie, MD;  Location: [facility];  Service: Gynecology;  Laterality: N/A;   INCISION AND DRAINAGE     chest   KNEE SURGERY      OB History     Gravida  3   Para      Term      Preterm      AB  2   Living  0      SAB  1   IAB  1   Ectopic      Multiple      Live Births               Home Medications    Prior to Admission medications   Medication Sig Start Date End Date Taking? Authorizing Provider  amoxicillin (AMOXIL) 875 MG tablet Take 1 tablet (875 mg total) by mouth 2 (two) times daily for 7 days. 05/31/21 06/07/21 Yes Brittnae Aschenbrenner C, PA-C  aspirin 81 MG chewable tablet  Chew 81 mg by mouth daily.    [provider]  folic acid (FOLVITE) 1 MG tablet Take 1 mg by mouth 4 (four) times daily.    [provider]  Prenatal Vit-Fe Fumarate-FA (PRENATAL VITAMINS PO) Take 1 tablet by mouth daily.    [provider]    Family History Family History  Problem Relation Age of Onset   Hypertension Mother    Diabetes Mother    Stroke Mother    Cancer Sister    Diabetes Other    Healthy Father     Social History Social History   Tobacco Use   Smoking status: Never   Smokeless tobacco: Never  Vaping Use   Vaping Use: Never used  Substance Use Topics   Alcohol use: No   Drug use: No     Allergies   Hydrocodone   Review of Systems Review of Systems  Constitutional:  Negative for activity change, appetite change, chills, fatigue and fever.  HENT:  Negative for congestion, ear pain, rhinorrhea, sinus pressure, sore throat and trouble swallowing.   Eyes:  Negative for discharge and redness.  Respiratory:  Negative for cough,  chest tightness and shortness of breath.   Cardiovascular:  Negative for chest pain.  Gastrointestinal:  Negative for abdominal pain, diarrhea, nausea and vomiting.  Musculoskeletal:  Negative for myalgias.  Skin:  Negative for rash.  Neurological:  Negative for dizziness, light-headedness and headaches.  Hematological:  Positive for adenopathy.    Physical Exam Triage Vital Signs ED Triage Vitals  Enc Vitals Group     BP      Pulse      Resp      Temp      Temp src      SpO2      Weight      Height      Head Circumference      Peak Flow      Pain Score      Pain Loc      Pain Edu?      Excl. in GC?    No data found.  Updated Vital Signs BP (!) 160/103   Pulse (!) 104   Temp 98.1 F (36.7 C) (Oral)   Resp 18   LMP 12/17/2020 (Exact Date)   SpO2 95%   Visual Acuity Right Eye Distance:   Left Eye Distance:   Bilateral Distance:    Right Eye Near:   Left Eye Near:     Bilateral Near:     Physical Exam Vitals and nursing note reviewed.  Constitutional:      Appearance: She is well-developed.     Comments: No acute distress  HENT:     Head: Normocephalic and atraumatic.     Ears:     Comments: Bilateral ears without tenderness to palpation of external auricle, tragus and mastoid, EAC's without erythema or swelling, TM's with good bony landmarks and cone of light. Non erythematous.      Nose: Nose normal.     Mouth/Throat:     Comments: Oral mucosa pink and moist, no tonsillar enlargement or exudate. Posterior pharynx patent and nonerythematous, no uvula deviation or swelling. Normal phonation.  Eyes:     Conjunctiva/sclera: Conjunctivae normal.  Neck:     Comments: Posterior base of neck midline with area of induration without overlying erythema, oval shaped, no lymphadenopathy noted in occipital region or bilateral posterior chains Cardiovascular:     Rate and Rhythm: Normal rate.  Pulmonary:     Effort: Pulmonary effort is normal. No respiratory distress.     Comments: Breathing comfortably at rest, CTABL, no wheezing, rales or other adventitious sounds auscultated  Abdominal:     General: There is no distension.  Musculoskeletal:        General: Normal range of motion.     Cervical back: Neck supple.  Skin:    General: Skin is warm and dry.  Neurological:     Mental Status: She is alert and oriented to person, place, and time.     UC Treatments / Results  Labs (all labs ordered are listed, but only abnormal results are displayed) Labs Reviewed - No data to display  EKG   Radiology No results found.  Procedures Procedures (including critical care time)  Medications Ordered in UC Medications - No data to display  Initial Impression / Assessment and Plan / UC Course  I have reviewed the triage vital signs and the nursing notes.  Pertinent labs & imaging results that were available during my care of the patient were  reviewed by me and considered in my medical decision making (see chart for details).  Swelling at base of neck-no overlying erythema or skin changes, possible early abscess, questionable lymphadenopathy given location, recommending warm compresses, covering sinusitis with amoxicillin in setting of pregnancy and close monitoring.  Full active range of motion of neck, no nuchal rigidity.  Discussed strict return precautions. Patient verbalized understanding and is agreeable with plan.   Final Clinical Impressions(s) / UC Diagnoses   Final diagnoses:  Lymphadenopathy of head and neck region     Discharge Instructions      Continue warm compresses to neck Tylenol for pain Amoxicillin twice daily for 1 week to cover sinus infection Continue to monitor area on neck, follow-up if not improving or worsening     ED Prescriptions     Medication Sig Dispense Auth. Provider   amoxicillin (AMOXIL) 875 MG tablet Take 1 tablet (875 mg total) by mouth 2 (two) times daily for 7 days. 14 tablet Codie Hainer, [place] C, PA-C      PDMP not reviewed this encounter.   Lew Dawes, PA-C 05/31/21 1008

## 2021-06-01 ENCOUNTER — Ambulatory Visit: Payer: Medicaid Other | Admitting: *Deleted

## 2021-06-01 ENCOUNTER — Encounter: Payer: Self-pay | Admitting: *Deleted

## 2021-06-01 ENCOUNTER — Ambulatory Visit: Payer: Medicaid Other | Attending: Obstetrics

## 2021-06-01 VITALS — BP 135/91 | HR 104

## 2021-06-01 DIAGNOSIS — Z3A23 23 weeks gestation of pregnancy: Secondary | ICD-10-CM | POA: Diagnosis not present

## 2021-06-01 DIAGNOSIS — Z362 Encounter for other antenatal screening follow-up: Secondary | ICD-10-CM

## 2021-06-01 DIAGNOSIS — Z6841 Body Mass Index (BMI) 40.0 and over, adult: Secondary | ICD-10-CM

## 2021-06-01 DIAGNOSIS — O99212 Obesity complicating pregnancy, second trimester: Secondary | ICD-10-CM | POA: Diagnosis not present

## 2021-06-02 ENCOUNTER — Other Ambulatory Visit: Payer: Self-pay | Admitting: *Deleted

## 2021-06-02 DIAGNOSIS — O365990 Maternal care for other known or suspected poor fetal growth, unspecified trimester, not applicable or unspecified: Secondary | ICD-10-CM

## 2021-06-21 ENCOUNTER — Telehealth: Payer: Self-pay

## 2021-06-21 NOTE — Telephone Encounter (Signed)
FETAL ECHO SCHEDULED FOR 07/20/2021@830A .

## 2021-06-22 ENCOUNTER — Other Ambulatory Visit: Payer: Self-pay | Admitting: Obstetrics and Gynecology

## 2021-06-22 ENCOUNTER — Other Ambulatory Visit: Payer: Self-pay

## 2021-06-22 ENCOUNTER — Ambulatory Visit (HOSPITAL_BASED_OUTPATIENT_CLINIC_OR_DEPARTMENT_OTHER): Payer: Medicaid Other | Admitting: Maternal & Fetal Medicine

## 2021-06-22 ENCOUNTER — Encounter: Payer: Self-pay | Admitting: *Deleted

## 2021-06-22 ENCOUNTER — Ambulatory Visit (HOSPITAL_BASED_OUTPATIENT_CLINIC_OR_DEPARTMENT_OTHER): Payer: Medicaid Other | Admitting: *Deleted

## 2021-06-22 ENCOUNTER — Ambulatory Visit: Payer: Medicaid Other | Admitting: *Deleted

## 2021-06-22 ENCOUNTER — Ambulatory Visit: Payer: Medicaid Other | Attending: Obstetrics and Gynecology

## 2021-06-22 DIAGNOSIS — Z364 Encounter for antenatal screening for fetal growth retardation: Secondary | ICD-10-CM

## 2021-06-22 DIAGNOSIS — O365920 Maternal care for other known or suspected poor fetal growth, second trimester, not applicable or unspecified: Secondary | ICD-10-CM | POA: Diagnosis not present

## 2021-06-22 DIAGNOSIS — O365990 Maternal care for other known or suspected poor fetal growth, unspecified trimester, not applicable or unspecified: Secondary | ICD-10-CM | POA: Insufficient documentation

## 2021-06-22 DIAGNOSIS — O99212 Obesity complicating pregnancy, second trimester: Secondary | ICD-10-CM | POA: Diagnosis not present

## 2021-06-22 DIAGNOSIS — O4102X0 Oligohydramnios, second trimester, not applicable or unspecified: Secondary | ICD-10-CM

## 2021-06-22 DIAGNOSIS — O4103X0 Oligohydramnios, third trimester, not applicable or unspecified: Secondary | ICD-10-CM | POA: Diagnosis not present

## 2021-06-22 DIAGNOSIS — Z3A26 26 weeks gestation of pregnancy: Secondary | ICD-10-CM

## 2021-06-22 DIAGNOSIS — Z362 Encounter for other antenatal screening follow-up: Secondary | ICD-10-CM

## 2021-06-22 NOTE — Procedures (Signed)
TIFFANEY HEIMANN 1991/02/16 [redacted]w[redacted]d  Fetus A Non-Stress Test Interpretation for 06/22/21  Indication: IUGR, Oligo  Fetal Heart Rate A Mode: External Baseline Rate (A): 145 bpm Variability: Moderate Accelerations: 10 x 10 Decelerations: None Multiple birth?: No  Uterine Activity Mode: Palpation, Toco Contraction Frequency (min): none Resting Tone Palpated: Relaxed Resting Time: Adequate  Interpretation (Fetal Testing) Nonstress Test Interpretation: Reactive Overall Impression: Reassuring for gestational age Comments: Dr. Grace Bushy reviewed tracing.

## 2021-06-22 NOTE — Progress Notes (Signed)
MFM Brief Note  Ms. Shoun is a G3P0 who is here at 83 w 5 d she is here for follow up growth due to prior FGR and elevated BMI. Normal interval growth with measurements consistent with FGR with EFW of 8% and AC 17%. Good fetal movement but oligohydramnios is visualized, there was a 2 x 2 pocket but the subjective view of the amniotic fluid was decreased. NST reactive The UA dopplers are normal without evidence of AEDF or REDF.  I discussed today's visit with Ms. Michelotti and interviewed her regarding rupture of membranes, she denies signs or symptoms. She conveyed that she does not drink much fluid as a whole. I encouraged her to oral hydrate 2 L per day with a good marker being clear urine. I discussed that the most common diagnosis of oligohydramnios is maternal dehydration however, in the context of FGR this may be placental related. She had a reactive NST without evidence of decelerations. She is scheduled to return in 1 weeks for UA Dopplers, NST and AFV.  All questions answered  I spent 20 minutes with > 50% in face to face consultation.  Novella Olive, MD.

## 2021-06-23 DIAGNOSIS — Z3A26 26 weeks gestation of pregnancy: Secondary | ICD-10-CM | POA: Diagnosis not present

## 2021-06-23 DIAGNOSIS — O99213 Obesity complicating pregnancy, third trimester: Secondary | ICD-10-CM | POA: Diagnosis not present

## 2021-06-23 DIAGNOSIS — O365999 Maternal care for other known or suspected poor fetal growth, unspecified trimester, other fetus: Secondary | ICD-10-CM | POA: Diagnosis not present

## 2021-06-24 ENCOUNTER — Other Ambulatory Visit: Payer: Self-pay | Admitting: *Deleted

## 2021-06-24 DIAGNOSIS — Z3492 Encounter for supervision of normal pregnancy, unspecified, second trimester: Secondary | ICD-10-CM | POA: Diagnosis not present

## 2021-06-24 DIAGNOSIS — R030 Elevated blood-pressure reading, without diagnosis of hypertension: Secondary | ICD-10-CM | POA: Diagnosis not present

## 2021-06-24 DIAGNOSIS — O365990 Maternal care for other known or suspected poor fetal growth, unspecified trimester, not applicable or unspecified: Secondary | ICD-10-CM

## 2021-06-28 DIAGNOSIS — O365990 Maternal care for other known or suspected poor fetal growth, unspecified trimester, not applicable or unspecified: Secondary | ICD-10-CM | POA: Diagnosis not present

## 2021-06-28 DIAGNOSIS — O4103X0 Oligohydramnios, third trimester, not applicable or unspecified: Secondary | ICD-10-CM | POA: Diagnosis not present

## 2021-06-29 ENCOUNTER — Ambulatory Visit (HOSPITAL_BASED_OUTPATIENT_CLINIC_OR_DEPARTMENT_OTHER): Payer: Medicaid Other | Admitting: *Deleted

## 2021-06-29 ENCOUNTER — Encounter: Payer: Self-pay | Admitting: *Deleted

## 2021-06-29 ENCOUNTER — Other Ambulatory Visit: Payer: Self-pay

## 2021-06-29 ENCOUNTER — Ambulatory Visit: Payer: Medicaid Other | Attending: Family Medicine

## 2021-06-29 ENCOUNTER — Ambulatory Visit: Payer: Medicaid Other | Admitting: *Deleted

## 2021-06-29 VITALS — BP 151/84 | HR 98

## 2021-06-29 DIAGNOSIS — Z362 Encounter for other antenatal screening follow-up: Secondary | ICD-10-CM

## 2021-06-29 DIAGNOSIS — O365920 Maternal care for other known or suspected poor fetal growth, second trimester, not applicable or unspecified: Secondary | ICD-10-CM

## 2021-06-29 DIAGNOSIS — Z3A27 27 weeks gestation of pregnancy: Secondary | ICD-10-CM | POA: Diagnosis not present

## 2021-06-29 DIAGNOSIS — Z6841 Body Mass Index (BMI) 40.0 and over, adult: Secondary | ICD-10-CM

## 2021-06-29 DIAGNOSIS — O99212 Obesity complicating pregnancy, second trimester: Secondary | ICD-10-CM

## 2021-06-29 DIAGNOSIS — O365990 Maternal care for other known or suspected poor fetal growth, unspecified trimester, not applicable or unspecified: Secondary | ICD-10-CM | POA: Diagnosis not present

## 2021-06-29 NOTE — Procedures (Signed)
Shannon Francis 01/01/1991 [redacted]w[redacted]d  Fetus A Non-Stress Test Interpretation for 06/29/21  Indication:  FGR, BMI 65  Fetal Heart Rate A Mode: External Baseline Rate (A): 145 bpm Variability: Moderate Accelerations: 10 x 10 Decelerations: None Multiple birth?: No  Uterine Activity Mode: Palpation, Toco Contraction Frequency (min): none Resting Tone Palpated: Relaxed Resting Time: Adequate  Interpretation (Fetal Testing) Nonstress Test Interpretation: Reactive Overall Impression: Reassuring for gestational age Comments: Dr. Grace Bushy reviewed tracing.

## 2021-07-07 ENCOUNTER — Encounter: Payer: Self-pay | Admitting: *Deleted

## 2021-07-07 ENCOUNTER — Other Ambulatory Visit: Payer: Self-pay

## 2021-07-07 ENCOUNTER — Ambulatory Visit: Payer: Medicaid Other | Attending: Maternal & Fetal Medicine

## 2021-07-07 ENCOUNTER — Ambulatory Visit: Payer: Medicaid Other | Admitting: *Deleted

## 2021-07-07 ENCOUNTER — Ambulatory Visit (HOSPITAL_BASED_OUTPATIENT_CLINIC_OR_DEPARTMENT_OTHER): Payer: Medicaid Other | Admitting: *Deleted

## 2021-07-07 VITALS — BP 135/88 | HR 102

## 2021-07-07 DIAGNOSIS — Z3A28 28 weeks gestation of pregnancy: Secondary | ICD-10-CM

## 2021-07-07 DIAGNOSIS — O365990 Maternal care for other known or suspected poor fetal growth, unspecified trimester, not applicable or unspecified: Secondary | ICD-10-CM | POA: Diagnosis not present

## 2021-07-07 DIAGNOSIS — O365930 Maternal care for other known or suspected poor fetal growth, third trimester, not applicable or unspecified: Secondary | ICD-10-CM

## 2021-07-07 DIAGNOSIS — Z6841 Body Mass Index (BMI) 40.0 and over, adult: Secondary | ICD-10-CM

## 2021-07-07 DIAGNOSIS — O4103X0 Oligohydramnios, third trimester, not applicable or unspecified: Secondary | ICD-10-CM

## 2021-07-07 DIAGNOSIS — Z362 Encounter for other antenatal screening follow-up: Secondary | ICD-10-CM | POA: Diagnosis not present

## 2021-07-07 DIAGNOSIS — O99213 Obesity complicating pregnancy, third trimester: Secondary | ICD-10-CM

## 2021-07-07 NOTE — Procedures (Signed)
Shannon Francis [birth year] [redacted]w[redacted]d  Fetus A Non-Stress Test Interpretation for 07/07/21  Indication:  Obesity  Fetal Heart Rate A Mode: External Baseline Rate (A): 150 bpm Variability: Moderate Accelerations: 10 x 10 Decelerations: None Multiple birth?: No  Uterine Activity Mode: Palpation, Toco Contraction Frequency (min): none Resting Tone Palpated: Relaxed Resting Time: Adequate  Interpretation (Fetal Testing) Nonstress Test Interpretation: Reactive Overall Impression: Reassuring for gestational age Comments: Dr. Grace Bushy reviewed tracing

## 2021-07-11 DIAGNOSIS — R7302 Impaired glucose tolerance (oral): Secondary | ICD-10-CM | POA: Diagnosis not present

## 2021-07-12 ENCOUNTER — Ambulatory Visit: Payer: Medicaid Other | Attending: Maternal & Fetal Medicine

## 2021-07-12 ENCOUNTER — Encounter: Payer: Self-pay | Admitting: *Deleted

## 2021-07-12 ENCOUNTER — Ambulatory Visit: Payer: Medicaid Other | Admitting: *Deleted

## 2021-07-12 ENCOUNTER — Other Ambulatory Visit: Payer: Self-pay

## 2021-07-12 ENCOUNTER — Ambulatory Visit (HOSPITAL_BASED_OUTPATIENT_CLINIC_OR_DEPARTMENT_OTHER): Payer: Medicaid Other | Admitting: *Deleted

## 2021-07-12 VITALS — BP 129/89 | HR 102

## 2021-07-12 DIAGNOSIS — O99213 Obesity complicating pregnancy, third trimester: Secondary | ICD-10-CM | POA: Diagnosis not present

## 2021-07-12 DIAGNOSIS — O365930 Maternal care for other known or suspected poor fetal growth, third trimester, not applicable or unspecified: Secondary | ICD-10-CM

## 2021-07-12 DIAGNOSIS — E669 Obesity, unspecified: Secondary | ICD-10-CM

## 2021-07-12 DIAGNOSIS — O365990 Maternal care for other known or suspected poor fetal growth, unspecified trimester, not applicable or unspecified: Secondary | ICD-10-CM

## 2021-07-12 DIAGNOSIS — Z3A29 29 weeks gestation of pregnancy: Secondary | ICD-10-CM | POA: Diagnosis not present

## 2021-07-12 DIAGNOSIS — Z362 Encounter for other antenatal screening follow-up: Secondary | ICD-10-CM

## 2021-07-12 NOTE — Procedures (Signed)
NASRIN LANZO Apr 23, 1991 [redacted]w[redacted]d  Fetus A Non-Stress Test Interpretation for 07/12/21  Indication: IUGR  Fetal Heart Rate A Mode: External Baseline Rate (A): 150 bpm Variability: Moderate Accelerations: 10 x 10 Decelerations: None Multiple birth?: No  Uterine Activity Mode: Palpation, Toco Contraction Frequency (min): none Resting Tone Palpated: Relaxed Resting Time: Adequate  Interpretation (Fetal Testing) Nonstress Test Interpretation: Reactive Overall Impression: Reassuring for gestational age Comments: Dr. Parke Poisson reviewed tracing

## 2021-07-19 ENCOUNTER — Other Ambulatory Visit: Payer: Self-pay | Admitting: *Deleted

## 2021-07-19 ENCOUNTER — Ambulatory Visit: Payer: Medicaid Other | Admitting: *Deleted

## 2021-07-19 ENCOUNTER — Other Ambulatory Visit: Payer: Self-pay | Admitting: Maternal & Fetal Medicine

## 2021-07-19 ENCOUNTER — Other Ambulatory Visit: Payer: Self-pay

## 2021-07-19 ENCOUNTER — Encounter: Payer: Self-pay | Admitting: *Deleted

## 2021-07-19 ENCOUNTER — Ambulatory Visit (HOSPITAL_BASED_OUTPATIENT_CLINIC_OR_DEPARTMENT_OTHER): Payer: Medicaid Other | Admitting: *Deleted

## 2021-07-19 ENCOUNTER — Ambulatory Visit: Payer: Medicaid Other | Attending: Maternal & Fetal Medicine

## 2021-07-19 VITALS — BP 134/88 | HR 106

## 2021-07-19 DIAGNOSIS — O99213 Obesity complicating pregnancy, third trimester: Secondary | ICD-10-CM

## 2021-07-19 DIAGNOSIS — O365930 Maternal care for other known or suspected poor fetal growth, third trimester, not applicable or unspecified: Secondary | ICD-10-CM

## 2021-07-19 DIAGNOSIS — Z362 Encounter for other antenatal screening follow-up: Secondary | ICD-10-CM

## 2021-07-19 DIAGNOSIS — O365990 Maternal care for other known or suspected poor fetal growth, unspecified trimester, not applicable or unspecified: Secondary | ICD-10-CM

## 2021-07-19 DIAGNOSIS — E669 Obesity, unspecified: Secondary | ICD-10-CM | POA: Diagnosis not present

## 2021-07-19 DIAGNOSIS — O365931 Maternal care for other known or suspected poor fetal growth, third trimester, fetus 1: Secondary | ICD-10-CM

## 2021-07-19 DIAGNOSIS — Z3A30 30 weeks gestation of pregnancy: Secondary | ICD-10-CM | POA: Diagnosis not present

## 2021-07-19 NOTE — Procedures (Signed)
Shannon Francis [birth year] [redacted]w[redacted]d  Fetus A Non-Stress Test Interpretation for 07/19/21  Indication: IUGR  Fetal Heart Rate A Mode: External Baseline Rate (A): 150 bpm Variability: Moderate Accelerations: 10 x 10 Decelerations: None Multiple birth?: No  Uterine Activity Mode: Palpation, Toco Contraction Frequency (min): none Resting Tone Palpated: Relaxed Resting Time: Adequate  Interpretation (Fetal Testing) Nonstress Test Interpretation: Reactive Overall Impression: Reassuring for gestational age Comments: Dr. Parke Poisson reviewed tracing

## 2021-07-28 ENCOUNTER — Ambulatory Visit: Payer: Medicaid Other | Admitting: *Deleted

## 2021-07-28 ENCOUNTER — Other Ambulatory Visit: Payer: Self-pay

## 2021-07-28 ENCOUNTER — Encounter: Payer: Self-pay | Admitting: *Deleted

## 2021-07-28 ENCOUNTER — Ambulatory Visit: Payer: Medicaid Other | Attending: Obstetrics

## 2021-07-28 VITALS — BP 130/85 | HR 101

## 2021-07-28 DIAGNOSIS — O365930 Maternal care for other known or suspected poor fetal growth, third trimester, not applicable or unspecified: Secondary | ICD-10-CM

## 2021-07-28 DIAGNOSIS — O365931 Maternal care for other known or suspected poor fetal growth, third trimester, fetus 1: Secondary | ICD-10-CM | POA: Insufficient documentation

## 2021-08-02 ENCOUNTER — Ambulatory Visit: Payer: Medicaid Other | Admitting: *Deleted

## 2021-08-02 ENCOUNTER — Ambulatory Visit: Payer: Medicaid Other | Attending: Obstetrics and Gynecology

## 2021-08-02 ENCOUNTER — Encounter: Payer: Self-pay | Admitting: *Deleted

## 2021-08-02 ENCOUNTER — Other Ambulatory Visit: Payer: Self-pay | Admitting: Obstetrics

## 2021-08-02 ENCOUNTER — Other Ambulatory Visit: Payer: Self-pay

## 2021-08-02 ENCOUNTER — Ambulatory Visit: Payer: Medicaid Other

## 2021-08-02 VITALS — BP 140/84 | HR 100

## 2021-08-02 VITALS — BP 135/82

## 2021-08-02 DIAGNOSIS — O365931 Maternal care for other known or suspected poor fetal growth, third trimester, fetus 1: Secondary | ICD-10-CM | POA: Diagnosis not present

## 2021-08-02 DIAGNOSIS — O365930 Maternal care for other known or suspected poor fetal growth, third trimester, not applicable or unspecified: Secondary | ICD-10-CM

## 2021-08-02 DIAGNOSIS — Z3A32 32 weeks gestation of pregnancy: Secondary | ICD-10-CM

## 2021-08-02 DIAGNOSIS — Z362 Encounter for other antenatal screening follow-up: Secondary | ICD-10-CM

## 2021-08-02 DIAGNOSIS — O99213 Obesity complicating pregnancy, third trimester: Secondary | ICD-10-CM

## 2021-08-02 NOTE — Procedures (Signed)
Shannon Francis March 16, 1991 [redacted]w[redacted]d  Fetus A Non-Stress Test Interpretation for 08/02/21  Indication: IUGR and Unsatisfactory BPP  Fetal Heart Rate A Mode: External Baseline Rate (A): 140 bpm Variability: Moderate Accelerations: 15 x 15 Decelerations: None Multiple birth?: No  Uterine Activity Mode: Palpation, Toco Contraction Frequency (min): None Resting Tone Palpated: Relaxed Resting Time: Adequate  Interpretation (Fetal Testing) Nonstress Test Interpretation: Reactive Comments: Dr. Judeth Cornfield reviewed tracing.

## 2021-08-03 ENCOUNTER — Encounter: Payer: Self-pay | Admitting: Registered"

## 2021-08-03 ENCOUNTER — Encounter: Payer: Medicaid Other | Attending: Obstetrics and Gynecology | Admitting: Registered"

## 2021-08-03 DIAGNOSIS — O24419 Gestational diabetes mellitus in pregnancy, unspecified control: Secondary | ICD-10-CM | POA: Diagnosis not present

## 2021-08-03 NOTE — Progress Notes (Signed)
Patient was seen on 08/03/21 for Gestational Diabetes self-management class at the [facility]. The following learning objectives were met by the patient during this course:  States the definition of Gestational Diabetes States why dietary management is important in controlling blood glucose Describes the effects each nutrient has on blood glucose levels Demonstrates ability to create a balanced meal plan Demonstrates carbohydrate counting  States when to check blood glucose levels Demonstrates proper blood glucose monitoring techniques States the effect of stress and exercise on blood glucose levels States the importance of limiting caffeine and abstaining from alcohol and smoking  Blood glucose monitor given: Accu-chek Guide Me Lot #[id number] Exp: 04/15/2022 CBG: 104 mg/dL  Patient instructed to monitor glucose levels: FBS: 60 - <95; 1 hour: <140; 2 hour: <120  Patient received handouts: Nutrition Diabetes and Pregnancy, including carb counting list  Patient will be seen for follow-up as needed. 

## 2021-08-09 ENCOUNTER — Other Ambulatory Visit: Payer: Self-pay | Admitting: *Deleted

## 2021-08-09 ENCOUNTER — Encounter: Payer: Self-pay | Admitting: *Deleted

## 2021-08-09 ENCOUNTER — Other Ambulatory Visit: Payer: Self-pay

## 2021-08-09 ENCOUNTER — Ambulatory Visit: Payer: Medicaid Other | Attending: Obstetrics

## 2021-08-09 ENCOUNTER — Ambulatory Visit: Payer: Medicaid Other | Admitting: *Deleted

## 2021-08-09 VITALS — BP 142/87 | HR 103

## 2021-08-09 DIAGNOSIS — O365930 Maternal care for other known or suspected poor fetal growth, third trimester, not applicable or unspecified: Secondary | ICD-10-CM

## 2021-08-09 DIAGNOSIS — O99213 Obesity complicating pregnancy, third trimester: Secondary | ICD-10-CM | POA: Diagnosis not present

## 2021-08-09 DIAGNOSIS — O365931 Maternal care for other known or suspected poor fetal growth, third trimester, fetus 1: Secondary | ICD-10-CM | POA: Insufficient documentation

## 2021-08-09 DIAGNOSIS — Z3A33 33 weeks gestation of pregnancy: Secondary | ICD-10-CM

## 2021-08-09 DIAGNOSIS — E669 Obesity, unspecified: Secondary | ICD-10-CM | POA: Diagnosis not present

## 2021-08-11 ENCOUNTER — Telehealth: Payer: Self-pay

## 2021-08-11 NOTE — Telephone Encounter (Signed)
FETAL ECHO COMPLETED 06/28/2021.

## 2021-08-17 ENCOUNTER — Other Ambulatory Visit: Payer: Self-pay

## 2021-08-17 ENCOUNTER — Encounter: Payer: Self-pay | Admitting: *Deleted

## 2021-08-17 ENCOUNTER — Ambulatory Visit: Payer: Medicaid Other | Attending: Obstetrics

## 2021-08-17 ENCOUNTER — Ambulatory Visit: Payer: Medicaid Other | Admitting: *Deleted

## 2021-08-17 VITALS — BP 130/88 | HR 105

## 2021-08-17 DIAGNOSIS — Z3A34 34 weeks gestation of pregnancy: Secondary | ICD-10-CM

## 2021-08-17 DIAGNOSIS — O99213 Obesity complicating pregnancy, third trimester: Secondary | ICD-10-CM

## 2021-08-17 DIAGNOSIS — O24410 Gestational diabetes mellitus in pregnancy, diet controlled: Secondary | ICD-10-CM | POA: Diagnosis not present

## 2021-08-17 DIAGNOSIS — O365930 Maternal care for other known or suspected poor fetal growth, third trimester, not applicable or unspecified: Secondary | ICD-10-CM

## 2021-08-17 DIAGNOSIS — O365931 Maternal care for other known or suspected poor fetal growth, third trimester, fetus 1: Secondary | ICD-10-CM | POA: Diagnosis not present

## 2021-08-24 ENCOUNTER — Ambulatory Visit: Payer: Medicaid Other | Attending: Obstetrics

## 2021-08-24 ENCOUNTER — Other Ambulatory Visit: Payer: Self-pay

## 2021-08-24 ENCOUNTER — Encounter: Payer: Self-pay | Admitting: *Deleted

## 2021-08-24 ENCOUNTER — Ambulatory Visit: Payer: Medicaid Other | Admitting: *Deleted

## 2021-08-24 VITALS — BP 141/93 | HR 96

## 2021-08-24 DIAGNOSIS — O365930 Maternal care for other known or suspected poor fetal growth, third trimester, not applicable or unspecified: Secondary | ICD-10-CM

## 2021-08-24 DIAGNOSIS — Z3490 Encounter for supervision of normal pregnancy, unspecified, unspecified trimester: Secondary | ICD-10-CM | POA: Diagnosis not present

## 2021-08-24 DIAGNOSIS — Z3A35 35 weeks gestation of pregnancy: Secondary | ICD-10-CM

## 2021-08-24 DIAGNOSIS — E669 Obesity, unspecified: Secondary | ICD-10-CM | POA: Diagnosis not present

## 2021-08-24 DIAGNOSIS — O99213 Obesity complicating pregnancy, third trimester: Secondary | ICD-10-CM | POA: Diagnosis not present

## 2021-08-24 LAB — OB RESULTS CONSOLE GBS: GBS: NEGATIVE

## 2021-08-30 ENCOUNTER — Other Ambulatory Visit: Payer: Self-pay | Admitting: *Deleted

## 2021-08-30 ENCOUNTER — Other Ambulatory Visit: Payer: Self-pay

## 2021-08-30 ENCOUNTER — Ambulatory Visit: Payer: Medicaid Other | Attending: Obstetrics

## 2021-08-30 ENCOUNTER — Ambulatory Visit: Payer: Medicaid Other | Admitting: *Deleted

## 2021-08-30 ENCOUNTER — Encounter: Payer: Self-pay | Admitting: *Deleted

## 2021-08-30 VITALS — BP 137/82 | HR 107

## 2021-08-30 DIAGNOSIS — O365930 Maternal care for other known or suspected poor fetal growth, third trimester, not applicable or unspecified: Secondary | ICD-10-CM

## 2021-08-30 DIAGNOSIS — O99213 Obesity complicating pregnancy, third trimester: Secondary | ICD-10-CM

## 2021-08-30 DIAGNOSIS — O365931 Maternal care for other known or suspected poor fetal growth, third trimester, fetus 1: Secondary | ICD-10-CM | POA: Diagnosis not present

## 2021-08-30 DIAGNOSIS — E669 Obesity, unspecified: Secondary | ICD-10-CM | POA: Diagnosis not present

## 2021-08-30 DIAGNOSIS — Z3A36 36 weeks gestation of pregnancy: Secondary | ICD-10-CM

## 2021-09-06 ENCOUNTER — Ambulatory Visit: Payer: Federal, State, Local not specified - PPO | Admitting: *Deleted

## 2021-09-06 ENCOUNTER — Other Ambulatory Visit: Payer: Self-pay

## 2021-09-06 ENCOUNTER — Encounter: Payer: Self-pay | Admitting: *Deleted

## 2021-09-06 ENCOUNTER — Ambulatory Visit (HOSPITAL_BASED_OUTPATIENT_CLINIC_OR_DEPARTMENT_OTHER): Payer: Federal, State, Local not specified - PPO

## 2021-09-06 VITALS — BP 144/90 | HR 90

## 2021-09-06 DIAGNOSIS — Z6841 Body Mass Index (BMI) 40.0 and over, adult: Secondary | ICD-10-CM | POA: Insufficient documentation

## 2021-09-06 DIAGNOSIS — Z3A37 37 weeks gestation of pregnancy: Secondary | ICD-10-CM | POA: Diagnosis not present

## 2021-09-06 DIAGNOSIS — O365930 Maternal care for other known or suspected poor fetal growth, third trimester, not applicable or unspecified: Secondary | ICD-10-CM | POA: Insufficient documentation

## 2021-09-06 DIAGNOSIS — O99213 Obesity complicating pregnancy, third trimester: Secondary | ICD-10-CM

## 2021-09-06 DIAGNOSIS — E669 Obesity, unspecified: Secondary | ICD-10-CM | POA: Diagnosis not present

## 2021-09-07 ENCOUNTER — Telehealth (HOSPITAL_COMMUNITY): Payer: Self-pay | Admitting: *Deleted

## 2021-09-07 ENCOUNTER — Encounter (HOSPITAL_COMMUNITY): Payer: Self-pay | Admitting: Obstetrics & Gynecology

## 2021-09-07 ENCOUNTER — Inpatient Hospital Stay (HOSPITAL_COMMUNITY)
Admission: AD | Admit: 2021-09-07 | Discharge: 2021-09-13 | DRG: 786 | Disposition: A | Discharge status: Home / Self Care | Payer: Federal, State, Local not specified - PPO | Attending: Obstetrics & Gynecology | Admitting: Obstetrics & Gynecology

## 2021-09-07 DIAGNOSIS — O9832 Other infections with a predominantly sexual mode of transmission complicating childbirth: Secondary | ICD-10-CM | POA: Diagnosis present

## 2021-09-07 DIAGNOSIS — Z98891 History of uterine scar from previous surgery: Secondary | ICD-10-CM

## 2021-09-07 DIAGNOSIS — R000 Tachycardia, unspecified: Secondary | ICD-10-CM | POA: Diagnosis present

## 2021-09-07 DIAGNOSIS — O365930 Maternal care for other known or suspected poor fetal growth, third trimester, not applicable or unspecified: Secondary | ICD-10-CM | POA: Diagnosis present

## 2021-09-07 DIAGNOSIS — O99214 Obesity complicating childbirth: Secondary | ICD-10-CM | POA: Diagnosis present

## 2021-09-07 DIAGNOSIS — Z3A37 37 weeks gestation of pregnancy: Secondary | ICD-10-CM | POA: Diagnosis not present

## 2021-09-07 DIAGNOSIS — B009 Herpesviral infection, unspecified: Secondary | ICD-10-CM | POA: Diagnosis not present

## 2021-09-07 DIAGNOSIS — Z3490 Encounter for supervision of normal pregnancy, unspecified, unspecified trimester: Secondary | ICD-10-CM | POA: Diagnosis present

## 2021-09-07 DIAGNOSIS — O139 Gestational [pregnancy-induced] hypertension without significant proteinuria, unspecified trimester: Secondary | ICD-10-CM | POA: Diagnosis present

## 2021-09-07 DIAGNOSIS — O9081 Anemia of the puerperium: Secondary | ICD-10-CM | POA: Diagnosis not present

## 2021-09-07 DIAGNOSIS — Z20822 Contact with and (suspected) exposure to covid-19: Secondary | ICD-10-CM | POA: Diagnosis present

## 2021-09-07 DIAGNOSIS — Z6841 Body Mass Index (BMI) 40.0 and over, adult: Secondary | ICD-10-CM | POA: Diagnosis not present

## 2021-09-07 DIAGNOSIS — O411230 Chorioamnionitis, third trimester, not applicable or unspecified: Secondary | ICD-10-CM | POA: Diagnosis present

## 2021-09-07 DIAGNOSIS — O24420 Gestational diabetes mellitus in childbirth, diet controlled: Secondary | ICD-10-CM | POA: Diagnosis present

## 2021-09-07 DIAGNOSIS — O134 Gestational [pregnancy-induced] hypertension without significant proteinuria, complicating childbirth: Principal | ICD-10-CM | POA: Diagnosis present

## 2021-09-07 DIAGNOSIS — D6859 Other primary thrombophilia: Secondary | ICD-10-CM | POA: Diagnosis present

## 2021-09-07 DIAGNOSIS — O4100X0 Oligohydramnios, unspecified trimester, not applicable or unspecified: Secondary | ICD-10-CM | POA: Diagnosis not present

## 2021-09-07 DIAGNOSIS — O9912 Other diseases of the blood and blood-forming organs and certain disorders involving the immune mechanism complicating childbirth: Secondary | ICD-10-CM | POA: Diagnosis present

## 2021-09-07 DIAGNOSIS — D62 Acute posthemorrhagic anemia: Secondary | ICD-10-CM | POA: Diagnosis not present

## 2021-09-07 DIAGNOSIS — O24419 Gestational diabetes mellitus in pregnancy, unspecified control: Secondary | ICD-10-CM | POA: Diagnosis present

## 2021-09-07 DIAGNOSIS — A6000 Herpesviral infection of urogenital system, unspecified: Secondary | ICD-10-CM | POA: Diagnosis present

## 2021-09-07 DIAGNOSIS — O99892 Other specified diseases and conditions complicating childbirth: Secondary | ICD-10-CM | POA: Diagnosis present

## 2021-09-07 DIAGNOSIS — Z3A38 38 weeks gestation of pregnancy: Secondary | ICD-10-CM | POA: Diagnosis not present

## 2021-09-07 DIAGNOSIS — O411290 Chorioamnionitis, unspecified trimester, not applicable or unspecified: Secondary | ICD-10-CM | POA: Diagnosis not present

## 2021-09-07 DIAGNOSIS — O365990 Maternal care for other known or suspected poor fetal growth, unspecified trimester, not applicable or unspecified: Secondary | ICD-10-CM | POA: Diagnosis present

## 2021-09-07 LAB — COMPREHENSIVE METABOLIC PANEL
ALT: 21 U/L (ref 0–44)
AST: 18 U/L (ref 15–41)
Albumin: 2.5 g/dL — ABNORMAL LOW (ref 3.5–5.0)
Alkaline Phosphatase: 126 U/L (ref 38–126)
Anion gap: 7 (ref 5–15)
BUN: 9 mg/dL (ref 6–20)
CO2: 24 mmol/L (ref 22–32)
Calcium: 9 mg/dL (ref 8.9–10.3)
Chloride: 107 mmol/L (ref 98–111)
Creatinine, Ser: 0.72 mg/dL (ref 0.44–1.00)
GFR, Estimated: >60 mL/min (ref >60)
Glucose, Bld: 101 mg/dL — ABNORMAL HIGH (ref 70–99)
Potassium: 3.7 mmol/L (ref 3.5–5.1)
Sodium: 138 mmol/L (ref 135–145)
Total Bilirubin: 0.5 mg/dL (ref 0.3–1.2)
Total Protein: 6.3 g/dL — ABNORMAL LOW (ref 6.5–8.1)

## 2021-09-07 LAB — GLUCOSE, CAPILLARY
Glucose-Capillary: 114 mg/dL — ABNORMAL HIGH (ref 70–99)
Glucose-Capillary: 129 mg/dL — ABNORMAL HIGH (ref 70–99)

## 2021-09-07 LAB — CBC
HCT: 39 % (ref 36.0–46.0)
Hemoglobin: 12.5 g/dL (ref 12.0–15.0)
MCH: 27.1 pg (ref 26.0–34.0)
MCHC: 32.1 g/dL (ref 30.0–36.0)
MCV: 84.6 fL (ref 80.0–100.0)
Platelets: 230 10*3/uL (ref 150–400)
RBC: 4.61 MIL/uL (ref 3.87–5.11)
RDW: 16 % — ABNORMAL HIGH (ref 11.5–15.5)
WBC: 9.3 10*3/uL (ref 4.0–10.5)
nRBC: 0 % (ref 0.0–0.2)

## 2021-09-07 LAB — PROTEIN / CREATININE RATIO, URINE
Creatinine, Urine: 281.53 mg/dL
Protein Creatinine Ratio: 0.07 mg/mg{Cre} (ref 0.00–0.15)
Total Protein, Urine: 19 mg/dL

## 2021-09-07 LAB — TYPE AND SCREEN
ABO/RH(D): B POS
Antibody Screen: NEGATIVE

## 2021-09-07 LAB — RESP PANEL BY RT-PCR (FLU A&B, COVID) ARPGX2
Influenza A by PCR: NEGATIVE
Influenza B by PCR: NEGATIVE
SARS Coronavirus 2 by RT PCR: NEGATIVE

## 2021-09-07 MED ORDER — ACETAMINOPHEN 325 MG PO TABS: 650.0000 mg | ORAL_TABLET | ORAL | Status: DC | PRN

## 2021-09-07 MED ORDER — LABETALOL HCL 5 MG/ML IV SOLN
INTRAVENOUS | Status: AC
  Administered 2021-09-07: 20 mg via INTRAVENOUS
  Filled 2021-09-07: qty 4

## 2021-09-07 MED ORDER — FLEET ENEMA 7-19 GM/118ML RE ENEM: 1.0000 | ENEMA | RECTAL | Status: DC | PRN

## 2021-09-07 MED ORDER — LABETALOL HCL 5 MG/ML IV SOLN
20.0000 mg | INTRAVENOUS | Status: DC | PRN
  Administered 2021-09-08 – 2021-09-09 (×2): 20 mg via INTRAVENOUS
  Filled 2021-09-07 (×2): qty 4

## 2021-09-07 MED ORDER — LABETALOL HCL 5 MG/ML IV SOLN: 80.0000 mg | INTRAVENOUS | Status: DC | PRN

## 2021-09-07 MED ORDER — OXYTOCIN-SODIUM CHLORIDE 30-0.9 UT/500ML-% IV SOLN
2.5000 [IU]/h | INTRAVENOUS | Status: DC
  Filled 2021-09-07: qty 500

## 2021-09-07 MED ORDER — LACTATED RINGERS IV SOLN
500.0000 mL | INTRAVENOUS | Status: DC | PRN
  Administered 2021-09-10: 500 mL via INTRAVENOUS

## 2021-09-07 MED ORDER — LIDOCAINE HCL (PF) 1 % IJ SOLN
30.0000 mL | INTRAMUSCULAR | Status: DC | PRN
Start: 2021-09-07 — End: 2021-09-10

## 2021-09-07 MED ORDER — SOD CITRATE-CITRIC ACID 500-334 MG/5ML PO SOLN
30.0000 mL | ORAL | Status: DC | PRN
  Administered 2021-09-10: 30 mL via ORAL
  Filled 2021-09-07: qty 30

## 2021-09-07 MED ORDER — LACTATED RINGERS IV SOLN: INTRAVENOUS | Status: DC

## 2021-09-07 MED ORDER — MISOPROSTOL 25 MCG QUARTER TABLET
25.0000 ug | ORAL_TABLET | ORAL | Status: DC | PRN
  Administered 2021-09-07 – 2021-09-08 (×4): 25 ug via VAGINAL
  Filled 2021-09-07 (×4): qty 1

## 2021-09-07 MED ORDER — OXYTOCIN BOLUS FROM INFUSION: 333.0000 mL | Freq: Once | INTRAVENOUS | Status: DC

## 2021-09-07 MED ORDER — FENTANYL CITRATE (PF) 100 MCG/2ML IJ SOLN
50.0000 ug | INTRAMUSCULAR | Status: DC | PRN
Start: 2021-09-07 — End: 2021-09-10
  Administered 2021-09-08: 100 ug via INTRAVENOUS
  Administered 2021-09-08: 50 ug via INTRAVENOUS
  Administered 2021-09-08 – 2021-09-09 (×3): 100 ug via INTRAVENOUS
  Filled 2021-09-07 (×7): qty 2

## 2021-09-07 MED ORDER — ONDANSETRON HCL 4 MG/2ML IJ SOLN: 4.0000 mg | Freq: Four times a day (QID) | INTRAMUSCULAR | Status: DC | PRN

## 2021-09-07 MED ORDER — LABETALOL HCL 5 MG/ML IV SOLN: 40.0000 mg | INTRAVENOUS | Status: DC | PRN

## 2021-09-07 MED ORDER — HYDRALAZINE HCL 20 MG/ML IJ SOLN: 10.0000 mg | INTRAMUSCULAR | Status: DC | PRN

## 2021-09-07 MED ORDER — TERBUTALINE SULFATE 1 MG/ML IJ SOLN
0.2500 mg | Freq: Once | INTRAMUSCULAR | Status: DC | PRN
Start: 2021-09-07 — End: 2021-09-10

## 2021-09-07 MED ORDER — HYDROXYZINE HCL 50 MG PO TABS: 50.0000 mg | ORAL_TABLET | Freq: Four times a day (QID) | ORAL | Status: DC | PRN

## 2021-09-07 NOTE — Telephone Encounter (Signed)
Preadmission screen  

## 2021-09-07 NOTE — H&P (Signed)
OB ADMISSION/ HISTORY & PHYSICAL:  Admission Date: 09/07/2021  3:43 PM  Admit Diagnosis: Gestational hypertension  Shannon Francis is a 30 y.o. female G3P0020 [redacted]w[redacted]d sent from office for IOL for increasing BP, A1 GDM and previous fetal growth restriction. Mild range BP over past 2 weeks, BP 150/90 in office today. Pt ruled in for gest hypertension. Pre-e work-up in progress. Endorses active FM, denies LOF and vaginal bleeding, doe not perceive contractions. Pt denies HA, visual changes, and RUQ pain.   History of current pregnancy:  Patient entered care with [facility] at 8+4 wks.   EDC 09/23/21 by LMP and congruent w/ 5+5 wk U/S.   Anatomy scan:  19+6 wks, complete w/ fundal placenta.   Antenatal testing: for maternal obesity, A1 GDM, and fetal growth restriction  Last evaluation: 35+5  wks vertex/ anterior placenta/ EFW 5# 2oz (2322g 11%)/ AFI 12.9/ BPP 8/8, no evidence of AEDF or REDF  Significant prenatal events:  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   INFECTION, HUMAN PAPILLOMAVIRUS 06/10/2007   HYPERTROPHY, BREAST 05/10/2007   OBESITY, NOS 02/07/2007   ACNE 02/07/2007    Prenatal Labs: ABO, Rh: B/Positive/-- (03/01 0000) Antibody: Negative (03/01 0000) Rubella: Immune (03/01 0000)  RPR: Nonreactive (03/01 0000)  HBsAg: Negative (03/01 0000)  HIV: Non-reactive (03/01 0000)  GTT: abnormal 3 hr GBS:   neg GC/CHL: neg/neg Genetics: low-risk, Neg AFP Tdap/influenza vaccines: tdap this preg, declined flu   OB History  Gravida Para Term Preterm AB Living  3       2 0  SAB IAB Ectopic Multiple Live Births  1 1          # Outcome Date GA Lbr Len/2nd Weight Sex Delivery Anes PTL Lv  3 Current           2 SAB 02/2019          1 IAB             Medical / Surgical History: Past medical history:  Past Medical History:  Diagnosis Date    Headache    Medical history non-contributory    Obesity     Past surgical history:  Past Surgical History:  Procedure Laterality Date   ANTERIOR CRUCIATE LIGAMENT REPAIR     left   DILATATION & CURETTAGE/HYSTEROSCOPY WITH MYOSURE N/A 08/13/2018   Procedure: DILATATION & CURETTAGE/HYSTEROSCOPY WITH MYOSURE;  Surgeon: Olivia Mackie, MD;  Location: [facility];  Service: Gynecology;  Laterality: N/A;   INCISION AND DRAINAGE     chest   KNEE SURGERY     Family History:  Family History  Problem Relation Age of Onset   Hypertension Mother    Diabetes Mother    Stroke Mother    Cancer Sister    Diabetes Other    Healthy Father     Social History:  reports that she has never smoked. She has never used smokeless tobacco. She reports that she does not drink alcohol and does not use drugs.  Allergies: Hydrocodone   Current Medications at time of admission:  Prior to Admission medications   Medication Sig Start Date End Date Taking? Authorizing Provider  aspirin 81 MG chewable tablet Chew 81 mg by mouth daily.    [provider]  folic acid (FOLVITE) 1 MG tablet Take 1 mg by mouth 4 (four) times daily. Patient not taking:  Reported on 07/12/2021    [provider]  Prenatal Vit-Fe Fumarate-FA (PRENATAL VITAMINS PO) Take 1 tablet by mouth daily.    [provider]    Review of Systems: Constitutional: Negative   HENT: Negative   Eyes: Negative   Respiratory: Negative   Cardiovascular: Negative   Gastrointestinal: Negative  Genitourinary: neg for bloody show, neg for LOF   Musculoskeletal: Negative   Skin: Negative   Neurological: Negative   Endo/Heme/Allergies: Negative   Psychiatric/Behavioral: Negative    Physical Exam: VS: Blood pressure (!) 158/83, pulse (!) 107, resp. rate 16, height 5\' 5"  (1.651 m), weight (!) 183.9 kg, last menstrual period 12/17/2020. AAO x3, no signs of distress Cardiovascular: RRR Respiratory: Lung fields clear to  ausculation GU/GI: Abdomen gravid, non-tender, non-distended, active FM, vertex per bedside U/S Extremities: 1+ non-pitting edema, negative for pain, tenderness, and cords  Cervical exam:Dilation: Closed Exam by:: [email or web address], RN FHR: baseline rate 145 / variability moderate / accelerations present / absent decelerations TOCO: none   Prenatal Transfer Tool  Maternal Diabetes: Yes:  Diabetes Type:  Diet controlled Genetic Screening: Normal Maternal Ultrasounds/Referrals: Other:fetal growth restriction in 2nd and 3rd trimester, resolved @ 35+5 (EFW 5# 2 oz (2322 Grams 11%) Fetal Ultrasounds or other Referrals:  Fetal echo, Referred to Materal Fetal Medicine  normal fetal echo, normal doppler studies, no evidence of AEDF or REDF Maternal Substance Abuse:  No Significant Maternal Medications:  None Significant Maternal Lab Results: Group B Strep negative    Assessment: 30 y.o. G3P0020 [redacted]w[redacted]d  IOL for GHTN A1 GDM Followed by MFM for fetal growth restriction    -35+5 wks EFW 5# 2 oz (2322 Grams 11%) Hx HSV     -Valtrex since 36 wks    -denies prodromal symptoms    -negative sterile speculum exam FHR category 1 GBS neg Pain management plan: considering epidural   Plan:  Admit to L&D Cytotec for cervical ripening Routine admission orders Epidural PRN  Dr [redacted]w[redacted]d notified of admission and plan of care  Mora Appl MSN, CNM 09/07/2021 5:53 PM

## 2021-09-08 ENCOUNTER — Other Ambulatory Visit: Payer: Self-pay

## 2021-09-08 LAB — RPR: RPR Ser Ql: NONREACTIVE

## 2021-09-08 LAB — GLUCOSE, CAPILLARY
Glucose-Capillary: 112 mg/dL — ABNORMAL HIGH (ref 70–99)
Glucose-Capillary: 105 mg/dL — ABNORMAL HIGH (ref 70–99)
Glucose-Capillary: 115 mg/dL — ABNORMAL HIGH (ref 70–99)
Glucose-Capillary: 110 mg/dL — ABNORMAL HIGH (ref 70–99)
Glucose-Capillary: 76 mg/dL (ref 70–99)

## 2021-09-08 MED ORDER — TERBUTALINE SULFATE 1 MG/ML IJ SOLN: 0.2500 mg | Freq: Once | INTRAMUSCULAR | Status: DC | PRN

## 2021-09-08 MED ORDER — EPHEDRINE 5 MG/ML INJ: 10.0000 mg | INTRAVENOUS | Status: DC | PRN

## 2021-09-08 MED ORDER — FENTANYL-BUPIVACAINE-NACL 0.5-0.125-0.9 MG/250ML-% EP SOLN
12.0000 mL/h | EPIDURAL | Status: DC | PRN
  Filled 2021-09-08: qty 250

## 2021-09-08 MED ORDER — DIPHENHYDRAMINE HCL 50 MG/ML IJ SOLN
12.5000 mg | INTRAMUSCULAR | Status: DC | PRN
  Administered 2021-09-09: 12.5 mg via INTRAVENOUS
  Filled 2021-09-08: qty 1

## 2021-09-08 MED ORDER — PHENYLEPHRINE 40 MCG/ML (10ML) SYRINGE FOR IV PUSH (FOR BLOOD PRESSURE SUPPORT): 80.0000 ug | PREFILLED_SYRINGE | INTRAVENOUS | Status: DC | PRN

## 2021-09-08 MED ORDER — LACTATED RINGERS IV SOLN: 500.0000 mL | Freq: Once | INTRAVENOUS | Status: DC

## 2021-09-08 MED ORDER — OXYTOCIN-SODIUM CHLORIDE 30-0.9 UT/500ML-% IV SOLN
1.0000 m[IU]/min | INTRAVENOUS | Status: DC
  Administered 2021-09-08 – 2021-09-10 (×2): 2 m[IU]/min via INTRAVENOUS
  Filled 2021-09-08: qty 500

## 2021-09-08 NOTE — Progress Notes (Signed)
Subjective:    Sitting in rocking chair. Starting to feel "Crampy". POC discussed with pt. Continue low dose pitocin for cervical ripening.  Objective:    VS: BP (!) 143/78   Pulse 83   Temp 99.1 F (37.3 C) (Oral)   Resp 15   Ht 5\' 5"  (1.651 m)   Wt (!) 183.9 kg   LMP 12/17/2020 (Exact Date)   BMI 67.48 kg/m  FHR : baseline 140 / variability minimal to moderate / accelerations present / absent decelerations Toco: contractions every 2-5 minutes  Membranes: intact Dilation: 1 Effacement (%): 50 Station: Ballotable Presentation: Vertex Exam by:: Holhauser, CNm Pitocin 10 mU/min  Assessment/Plan:   30 y.o. G3P0020 [redacted]w[redacted]d IOL for GHTN and GDMA1  Labor:  Cytotec x 4 doses. Low dose pitocin to max of 16mu for cervical ripening Preeclampsia:  no signs or symptoms of toxicity Fetal Wellbeing:  Category I Pain Control:  Epidural PRN I/D:   GBS  negative Anticipated MOD:  NSVD  9m MSN, CNM 09/08/2021 8:44 PM

## 2021-09-08 NOTE — Progress Notes (Signed)
Subjective:    Reports interrupted rest during the night due to difficulty maintaining continuous fetal surveillance (maternal habitus). Pt is contracting too frequently for further ripening with Cytotecm failed attempt at placing Brush balloon.   Objective:    VS: BP (!) 145/81   Pulse 89   Temp 98.8 F (37.1 C) (Oral)   Resp 18   Ht 5\' 5"  (1.651 m)   Wt (!) 183.9 kg   LMP 12/17/2020 (Exact Date)   BMI 67.48 kg/m  FHR : baseline 145 / variability moderate / accelerations present / absent decelerations Toco: contractions every 2-3 minutes  Membranes: intact Dilation: 1 Effacement (%): 70 Station: -3 Presentation: Vertex Exam by:: [email or web address], CNM   Assessment/Plan:   30 y.o. G3P0020 [redacted]w[redacted]d  GHTN without significant proteinuria A1 GDM    -continue CBG Q 4 hrs FHR Cat 1 GBS neg S/P Cytotec x3 doses, failed balloon placement, will discuss low-dose Pitocin with on-coming team. Anticipated MOD:  NSVD  [redacted]w[redacted]d MSN, CNM 09/08/2021 6:29 AM

## 2021-09-08 NOTE — Progress Notes (Signed)
Severe range BP requiring Labetalol protocol. BP responded to treatment.  CMP     Component Value Date/Time   NA 138 09/07/2021 1800   K 3.7 09/07/2021 1800   CL 107 09/07/2021 1800   CO2 24 09/07/2021 1800   GLUCOSE 101 (H) 09/07/2021 1800   BUN 9 09/07/2021 1800   CREATININE 0.72 09/07/2021 1800   CREATININE 0.87 12/01/2019 1459   CALCIUM 9.0 09/07/2021 1800   PROT 6.3 (L) 09/07/2021 1800   ALBUMIN 2.5 (L) 09/07/2021 1800   AST 18 09/07/2021 1800   AST 19 12/01/2019 1459   ALT 21 09/07/2021 1800   ALT 23 12/01/2019 1459   ALKPHOS 126 09/07/2021 1800   BILITOT 0.5 09/07/2021 1800   BILITOT 0.8 12/01/2019 1459   GFRNONAA >60 09/07/2021 1800   GFRNONAA >60 12/01/2019 1459   GFRAA >60 12/01/2019 1459   Protein creatinine ratio 0.07 Results for LIZANNE, ERKER (MRN [id number]) as of 09/08/2021 01:26  Ref. Range 09/07/2021 16:51 09/07/2021 16:54 09/07/2021 18:00 09/07/2021 18:58 09/07/2021 21:09  Glucose-Capillary Latest Ref Range: 70 - 99 mg/dL  175 (H)   102 (H)    A/P:  Shannon Francis 29 y.o. G3P0020 [redacted]w[redacted]d GHTN without significant proteinuria A1 GDM    -continue CBG Q 4 hrs FHR Cat 1 GBS neg Continue cervical ripening w/ Cytotec  Rhea Pink, MSN, CNM 09/08/2021 1:27 AM

## 2021-09-08 NOTE — Progress Notes (Signed)
Subjective:    Pt c/o irregular ctxs. Resting comfortably. SVE unchanged. Pitocin R/B/A discussed with pt. Pt consented to pitocin. Unable to attempt to  place foley balloon. Pt requesting to eat lunch prior to starting pitocin.  Objective:    VS: BP (!) 157/100   Pulse (!) 109   Temp 98.6 F (37 C) (Oral)   Resp 16   Ht 5\' 5"  (1.651 m)   Wt (!) 183.9 kg   LMP 12/17/2020 (Exact Date)   BMI 67.48 kg/m  FHR : baseline 145 / variability moderate / accelerations present / absent decelerations Toco: contractions irregular Membranes: Intact Dilation: 1 Effacement (%): 50 Station: Ballotable Presentation: Vertex Exam by:: Holhauser, CNm  Assessment/Plan:   30 y.o. G3P0020 [redacted]w[redacted]d IOL for GHTN, GDMA1  Labor:  Cytotec x 4 doses. Will start pitocin.   Low dose pitocin 1x1 max of 41mu for cervical ripening. Preeclampsia:  no signs or symptoms of toxicity Fetal Wellbeing:  Category I Pain Control:  Epidural prn I/D:   GBS negative Anticipated MOD:  NSVD Dr 11m updated on pt status and POC  Normand Sloop MSN, CNM 09/08/2021 2:32 PM

## 2021-09-08 NOTE — Progress Notes (Signed)
Subjective:    Pt eating breakfast. Will place cervidil after she is finished. Ctxs have spaced out.   Objective:    VS: BP (!) 153/108   Pulse (!) 101   Temp 98.6 F (37 C) (Oral)   Resp 16   Ht 5\' 5"  (1.651 m)   Wt (!) 183.9 kg   LMP 12/17/2020 (Exact Date)   BMI 67.48 kg/m  FHR : baseline 145 / variability moderat / accelerations present / absent decelerations Toco: contractions every 1-8 minutes  Membranes: intact Dilation: 1 Effacement (%): Thick Station: Ballotable Presentation: Vertex Exam by:: [email or web address], RN   Assessment/Plan:   30 y.o. G3P0020 [redacted]w[redacted]d IOL for GHTN, GDMA1  Labor:  Cytotec x 3 doses. Will place another cytotec after breakfast Preeclampsia:  no signs or symptoms of toxicity Fetal Wellbeing:  Category I Pain Control:  Epidural PRN I/D:   GBS negative Anticipated MOD:  NSVD Dr [redacted]w[redacted]d aware of pt status and POC  Normand Sloop MSN, CNM 09/08/2021 12:05 PM

## 2021-09-09 ENCOUNTER — Inpatient Hospital Stay (HOSPITAL_COMMUNITY): Payer: Federal, State, Local not specified - PPO | Admitting: Anesthesiology

## 2021-09-09 LAB — GLUCOSE, CAPILLARY
Glucose-Capillary: 91 mg/dL (ref 70–99)
Glucose-Capillary: 100 mg/dL — ABNORMAL HIGH (ref 70–99)
Glucose-Capillary: 93 mg/dL (ref 70–99)
Glucose-Capillary: 78 mg/dL (ref 70–99)
Glucose-Capillary: 95 mg/dL (ref 70–99)
Glucose-Capillary: 108 mg/dL — ABNORMAL HIGH (ref 70–99)

## 2021-09-09 LAB — CBC
HCT: 39.9 % (ref 36.0–46.0)
Hemoglobin: 12.8 g/dL (ref 12.0–15.0)
MCH: 26.7 pg (ref 26.0–34.0)
MCHC: 32.1 g/dL (ref 30.0–36.0)
MCV: 83.3 fL (ref 80.0–100.0)
Platelets: 224 10*3/uL (ref 150–400)
RBC: 4.79 MIL/uL (ref 3.87–5.11)
RDW: 16 % — ABNORMAL HIGH (ref 11.5–15.5)
WBC: 9.2 10*3/uL (ref 4.0–10.5)
nRBC: 0 % (ref 0.0–0.2)

## 2021-09-09 MED ORDER — LACTATED RINGERS IV SOLN: 500.0000 mL | Freq: Once | INTRAVENOUS | Status: DC

## 2021-09-09 MED ORDER — FENTANYL-BUPIVACAINE-NACL 0.5-0.125-0.9 MG/250ML-% EP SOLN
EPIDURAL | Status: DC | PRN
  Administered 2021-09-09: 1.5 mL/h via EPIDURAL

## 2021-09-09 MED ORDER — BUPIVACAINE HCL (PF) 0.25 % IJ SOLN
INTRAMUSCULAR | Status: DC | PRN
  Administered 2021-09-09: 1 mL via INTRATHECAL
  Administered 2021-09-10: .5 mL via INTRATHECAL

## 2021-09-09 NOTE — Anesthesia Preprocedure Evaluation (Addendum)
Anesthesia Evaluation  Patient identified by MRN, date of birth, ID band Patient awake    Reviewed: Allergy & Precautions, Patient's Chart, lab work & pertinent test results  History of Anesthesia Complications Negative for: history of anesthetic complications  Airway Mallampati: IV  TM Distance: >3 FB Neck ROM: Full    Dental no notable dental hx.    Pulmonary neg pulmonary ROS,    Pulmonary exam normal        Cardiovascular hypertension, Normal cardiovascular exam     Neuro/Psych negative neurological ROS  negative psych ROS   GI/Hepatic negative GI ROS, Neg liver ROS,   Endo/Other  diabetes, GestationalMorbid obesity (BMI 67)  Renal/GU negative Renal ROS  negative genitourinary   Musculoskeletal negative musculoskeletal ROS (+)   Abdominal   Peds  Hematology negative hematology ROS (+)   Anesthesia Other Findings Day of surgery medications reviewed with patient.  Reproductive/Obstetrics (+) Pregnancy (preE)                            Anesthesia Physical Anesthesia Plan  ASA: 4 and emergent  Anesthesia Plan: Epidural   Post-op Pain Management:    Induction:   PONV Risk Score and Plan: 3 and Treatment may vary due to age or medical condition, Ondansetron and Dexamethasone  Airway Management Planned: Natural Airway  Additional Equipment:   Intra-op Plan:   Post-operative Plan:   Informed Consent: I have reviewed the patients History and Physical, chart, labs and discussed the procedure including the risks, benefits and alternatives for the proposed anesthesia with the patient or authorized representative who has indicated his/her understanding and acceptance.       Plan Discussed with: CRNA  Anesthesia Plan Comments: (Preexisting intrathecal catheter to be used for C/S (failure to progress and NRFHT). Stephannie Peters, MD)       Anesthesia Quick Evaluation

## 2021-09-09 NOTE — Progress Notes (Signed)
Shannon Francis is a 30 y.o. G3P0020 at [redacted]w[redacted]d by LMP admitted for induction of labor due to Gestational diabetes, Hypertension, and Poor fetal growth.  Subjective: Patient comfortable - has intrathecal cath - no chest pain or shortness of breath.   Objective: BP (!) 141/76   Pulse 89   Temp 98.8 F (37.1 C) (Oral)   Resp (!) 36   Ht 5\' 5"  (1.651 m)   Wt (!) 183.9 kg   LMP 12/17/2020 (Exact Date)   SpO2 100%   BMI 67.48 kg/m  No intake/output data recorded. No intake/output data recorded.  FHT:  FHR: 135 bpm, variability: moderate,  accelerations:  Present,  decelerations:  Absent UC:   irregular, every 3-7 minutes SVE:   Dilation: 1 Effacement (%): 80 Station: -2 Exam by:: Kim H CNM  Labs: Lab Results  Component Value Date   WBC 9.2 09/09/2021   HGB 12.8 09/09/2021   HCT 39.9 09/09/2021   MCV 83.3 09/09/2021   PLT 224 09/09/2021    Assessment / Plan: Induction of labor due to IUGR, materal medical conditions, gestational hypertension, and gestational diabetes - with protracted latent phase.  Discussed with patient when she is at least 1.5-2cm will break her water and put internal monitors in, we also discussed the option of a foley bulb.   Labor:  protracted Preeclampsia:  labs stable and BPs normal now, however severe range over night no clinical s/sx of Pre E Fetal Wellbeing:  Category I Pain Control:  Epidural I/D:  n/a Anticipated MOD:   unsure  Kinleigh Nault 09/09/2021, 9:15 AM

## 2021-09-09 NOTE — Progress Notes (Signed)
Labor Progress Note  Shannon Francis is a 30 y.o. female G3P0020 on 9/28 was [redacted]w[redacted]d sent from office for IOL for increasing BP, A1 GDM and previous fetal growth restriction. Mild range BP over past 2 weeks, BP 150/90 in office today. Pt ruled in for gest hypertension. Pre-e work-p unremarkable, PCR 0.07.  H/O HSV no lesions, obesity BMI 67. Pt denies HA, visual changes, and RUQ pain. Current GA [redacted] weeks.   Subjective: I assumed care for pt. Pt in bed in NAD, stable, reviewed POC, pt currently on pitocin break after being on pitocin for 36 hours, will eat clear liquids, and restart pitocin at 2x2 at [place]. No cervical checks until then to avoid infection. Pt verbalized understanding and agreement for POC. Questions answered. Discuss POC with DR [facility] 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   Gestational hypertension 09/07/2021   INFECTION, HUMAN PAPILLOMAVIRUS 06/10/2007   HYPERTROPHY, BREAST 05/10/2007   OBESITY, NOS 02/07/2007   ACNE 02/07/2007   Objective: BP (!) 116/54   Pulse (!) 112   Temp 98.8 F (37.1 C) (Oral)   Resp (!) 32   Ht 5\' 5"  (1.651 m)   Wt (!) 183.9 kg   LMP 12/17/2020 (Exact Date)   SpO2 96%   BMI 67.48 kg/m  No intake/output data recorded. No intake/output data recorded. NST: FHR baseline 150 bpm, Variability: moderate, Accelerations:present, Decelerations:  Absent= Cat 1/Reactive CTX:  Unable to obtain, appears none  Uterus gravid, soft non tender, moderate to palpate with contractions.  SVE:  Dilation: 1 Effacement (%): 80 Station: -2 Exam by:: Polos RN Pitocin at 0 mUn/min  Assessment:  Shannon Francis is a 30 y.o. female G3P0020 on 9/28 was [redacted]w[redacted]d sent from office for IOL for increasing BP, A1 GDM and previous fetal growth restriction. Mild range BP over past 2 weeks, BP 150/90 in office today. Pt ruled in for gest  hypertension. Pre-e work-p unremarkable, PCR 0.07.  H/O HSV no lesions, obesity BMI 67 Pt denies HA, visual changes, and RUQ pain. Current GA [redacted] weeks.  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   Gestational hypertension 09/07/2021   INFECTION, HUMAN PAPILLOMAVIRUS 06/10/2007   HYPERTROPHY, BREAST 05/10/2007   OBESITY, NOS 02/07/2007   ACNE 02/07/2007   NICHD: Category 1  Membranes:  Possible and suspected SROM, clear at 1300 on 9/30, no s/s of infection  Induction:    Cytotec x 4 on 9/28 @ 1803, 2104, on 9/29 @ 0204, 1030 Foley Bulb: Attempted on 9/29, failed, currently suspected ROM  Pitocin - 0 now for pitocin break, started at 1500 on 9/29, started low dose, and increase today to 40 @ 1600 9/30 stopped  Pain management:               IV pain management: x 9/29 @ 0510, 0601, 0710, 2350, 9/30 @ 0524             Epidural placement:  at 0800 on 9/30 **INTRATHECAL CATHETER**  GBS Negative  A1GDM: stable  CBG (last 3)  Recent Labs    09/09/21 0851 09/09/21 1309 09/09/21 1706  GLUCAP 93 78 95   GHTN: X3 IV labetalol for SR BP, although cuff to small for pt, asymptomatic, PCR 0.07, current  BP 116/54, other labs unremarkable.   Plan: Continue labor plan GDMA1: CBG Q4H GHTN: monitor BP, labetalol protocol PRN, if BP severe range will recheck labs.  HSV: Monitor for lesions.  Continuous monitoring Rest Frequent position changes to facilitate fetal rotation and descent. Will reassess with cervical exam at 0000 on 10/1 or earlier if necessary Restart pitocin per protocol 2x2 at MN SCD ordered r/t BMI 67 Anticipate labor progression and vaginal delivery.   [facility] aware of plan and verbalized agreement.   Dale [place], NP-C, CNM, MSN 09/09/2021. 7:42 PM

## 2021-09-09 NOTE — Progress Notes (Signed)
XL upper arm and large radial cuff placement too small for pt habitus, changed to thigh upper arm cuff for correct fit.

## 2021-09-09 NOTE — Anesthesia Procedure Notes (Signed)
Epidural Patient location during procedure: OB Start time: 09/09/2021 7:54 AM End time: 09/09/2021 8:00 AM  Staffing Anesthesiologist: Kaylyn Layer, MD Performed: anesthesiologist   Preanesthetic Checklist Completed: patient identified, IV checked, risks and benefits discussed, monitors and equipment checked, pre-op evaluation and timeout performed  Epidural Patient position: sitting Prep: DuraPrep and site prepped and draped Patient monitoring: continuous pulse ox, blood pressure and heart rate Approach: midline Location: L3-L4 Injection technique: LOR air  Needle:  Needle type: Tuohy  Needle gauge: 17 G Needle length: 15 cm Needle insertion depth: 13 cm Catheter type: closed end flexible Catheter size: 19 Gauge Catheter at skin depth: 16 cm Epidural test dose: 1% lidocaine.  Assessment Events: blood not aspirated, injection not painful, no injection resistance, no paresthesia and negative IV test  Additional Notes **INTRATHECAL CATHETER**  Patient identified. Risks, benefits, and alternatives discussed with patient including but not limited to bleeding, infection, nerve damage, paralysis, failed block, incomplete pain control, headache, blood pressure changes, nausea, vomiting, reactions to medication, itching, and postpartum back pain. Confirmed with bedside nurse the patient's most recent platelet count. Confirmed with patient that they are not currently taking any anticoagulation, have any bleeding history, or any family history of bleeding disorders. Patient expressed understanding and wished to proceed. All questions were answered. Sterile technique was used throughout the entire procedure. Please see nursing notes for vital signs.   Difficulty due to super morbid obesity. First attempt with 9cm Tuohy, unable to reach identifiable structures. Second attempt at same location with 15cm Tuohy, several needle redirections required to access interspinous space. CSF return,  intrathecal catheter threaded easily and bolused as charted. Patient comfortable prior to my leaving room. Warning signs of high block given to the patient including shortness of breath, tingling/numbness in hands, complete motor block, or any concerning symptoms with instructions to call for help. Patient was given instructions on fall risk and not to get out of bed. All questions and concerns addressed with instructions to call with any issues or inadequate analgesia.  Reason for block:procedure for pain

## 2021-09-09 NOTE — Progress Notes (Signed)
Subjective:    Feeling very uncomfortable with ctxs. Requesting epidural. POC discussed with pt. Will begin pitocin 2x2.  Objective:    VS: BP (!) 153/96   Pulse 76   Temp 98.4 F (36.9 C) (Oral)   Resp 17   Ht 5\' 5"  (1.651 m)   Wt (!) 183.9 kg   LMP 12/17/2020 (Exact Date)   BMI 67.48 kg/m  FHR : baseline 135 / variability moderate / accelerations present / absent decelerations Toco: contractions every 3-6 minutes /  Membranes: intact Dilation: 1 Effacement (%): 80 Cervical Position: Posterior Station: -2 Presentation: Vertex Exam by:: Kim H CNM Pitocin 10 mU/min  Assessment/Plan:   30 y.o. G3P0020 [redacted]w[redacted]d IOL for GHTN and GDMA1  Labor:  Cytotec x 4, Low dose pitocin for cervical ripening overnight. Start pitocin 2x2 now. Preeclampsia:  no signs or symptoms of toxicity Fetal Wellbeing:  Category I Pain Control:  IV pain meds, Requesting epidural I/D:   GBS negative Anticipated MOD:  NSVD  [redacted]w[redacted]d MSN, CNM 09/09/2021 6:39 AM

## 2021-09-10 ENCOUNTER — Encounter (HOSPITAL_COMMUNITY)
Admission: AD | Disposition: A | Discharge status: Home / Self Care | Payer: Self-pay | Source: Home / Self Care | Attending: Obstetrics & Gynecology

## 2021-09-10 ENCOUNTER — Encounter (HOSPITAL_COMMUNITY): Payer: Self-pay | Admitting: Obstetrics & Gynecology

## 2021-09-10 DIAGNOSIS — Z3490 Encounter for supervision of normal pregnancy, unspecified, unspecified trimester: Secondary | ICD-10-CM | POA: Diagnosis present

## 2021-09-10 DIAGNOSIS — D62 Acute posthemorrhagic anemia: Secondary | ICD-10-CM | POA: Diagnosis not present

## 2021-09-10 DIAGNOSIS — O411290 Chorioamnionitis, unspecified trimester, not applicable or unspecified: Secondary | ICD-10-CM | POA: Diagnosis not present

## 2021-09-10 DIAGNOSIS — Z98891 History of uterine scar from previous surgery: Secondary | ICD-10-CM

## 2021-09-10 LAB — GLUCOSE, CAPILLARY
Glucose-Capillary: 122 mg/dL — ABNORMAL HIGH (ref 70–99)
Glucose-Capillary: 89 mg/dL (ref 70–99)
Glucose-Capillary: 76 mg/dL (ref 70–99)
Glucose-Capillary: 97 mg/dL (ref 70–99)
Glucose-Capillary: 88 mg/dL (ref 70–99)

## 2021-09-10 LAB — CBC
HCT: 35.3 % — ABNORMAL LOW (ref 36.0–46.0)
Hemoglobin: 11.6 g/dL — ABNORMAL LOW (ref 12.0–15.0)
MCH: 27.2 pg (ref 26.0–34.0)
MCHC: 32.9 g/dL (ref 30.0–36.0)
MCV: 82.9 fL (ref 80.0–100.0)
Platelets: 206 10*3/uL (ref 150–400)
RBC: 4.26 MIL/uL (ref 3.87–5.11)
RDW: 15.7 % — ABNORMAL HIGH (ref 11.5–15.5)
WBC: 15.6 10*3/uL — ABNORMAL HIGH (ref 4.0–10.5)
nRBC: 0 % (ref 0.0–0.2)

## 2021-09-10 LAB — CREATININE, SERUM
Creatinine, Ser: 1.02 mg/dL — ABNORMAL HIGH (ref 0.44–1.00)
GFR, Estimated: >60 mL/min (ref >60)

## 2021-09-10 SURGERY — Surgical Case
Anesthesia: Epidural

## 2021-09-10 MED ORDER — ACETAMINOPHEN 500 MG PO TABS: 1000.0000 mg | ORAL_TABLET | Freq: Once | ORAL | Status: DC

## 2021-09-10 MED ORDER — OXYCODONE HCL 5 MG PO TABS: 5.0000 mg | ORAL_TABLET | ORAL | Status: DC | PRN

## 2021-09-10 MED ORDER — PRENATAL MULTIVITAMIN CH
1.0000 | ORAL_TABLET | Freq: Every day | ORAL | Status: DC
  Administered 2021-09-11 – 2021-09-12 (×2): 1 via ORAL
  Filled 2021-09-10 (×2): qty 1

## 2021-09-10 MED ORDER — DIPHENHYDRAMINE HCL 25 MG PO CAPS
25.0000 mg | ORAL_CAPSULE | Freq: Four times a day (QID) | ORAL | Status: DC | PRN
  Administered 2021-09-11: 25 mg via ORAL
  Filled 2021-09-10: qty 1

## 2021-09-10 MED ORDER — NALOXONE HCL 4 MG/10ML IJ SOLN
1.0000 ug/kg/h | INTRAVENOUS | Status: DC | PRN
  Filled 2021-09-10: qty 5

## 2021-09-10 MED ORDER — FENTANYL CITRATE (PF) 100 MCG/2ML IJ SOLN
INTRAMUSCULAR | Status: AC
  Filled 2021-09-10: qty 2

## 2021-09-10 MED ORDER — ONDANSETRON HCL 4 MG/2ML IJ SOLN
INTRAMUSCULAR | Status: DC | PRN
  Administered 2021-09-10: 4 mg via INTRAVENOUS

## 2021-09-10 MED ORDER — SIMETHICONE 80 MG PO CHEW
80.0000 mg | CHEWABLE_TABLET | Freq: Three times a day (TID) | ORAL | Status: DC
  Administered 2021-09-11 – 2021-09-13 (×6): 80 mg via ORAL
  Filled 2021-09-10 (×6): qty 1

## 2021-09-10 MED ORDER — SENNOSIDES-DOCUSATE SODIUM 8.6-50 MG PO TABS
2.0000 | ORAL_TABLET | Freq: Every day | ORAL | Status: DC
  Administered 2021-09-11 – 2021-09-12 (×2): 2 via ORAL
  Filled 2021-09-10 (×3): qty 2

## 2021-09-10 MED ORDER — NALBUPHINE HCL 10 MG/ML IJ SOLN: 5.0000 mg | Freq: Once | INTRAMUSCULAR | Status: DC | PRN

## 2021-09-10 MED ORDER — PHENYLEPHRINE 40 MCG/ML (10ML) SYRINGE FOR IV PUSH (FOR BLOOD PRESSURE SUPPORT)
PREFILLED_SYRINGE | INTRAVENOUS | Status: AC
  Filled 2021-09-10: qty 10

## 2021-09-10 MED ORDER — LACTATED RINGERS IV SOLN: INTRAVENOUS | Status: DC

## 2021-09-10 MED ORDER — ZOLPIDEM TARTRATE 5 MG PO TABS: 5.0000 mg | ORAL_TABLET | Freq: Every evening | ORAL | Status: DC | PRN

## 2021-09-10 MED ORDER — BUPIVACAINE IN DEXTROSE 0.75-8.25 % IT SOLN
INTRATHECAL | Status: AC
  Filled 2021-09-10: qty 2

## 2021-09-10 MED ORDER — KETOROLAC TROMETHAMINE 30 MG/ML IJ SOLN
INTRAMUSCULAR | Status: AC
  Filled 2021-09-10: qty 1

## 2021-09-10 MED ORDER — DIBUCAINE (PERIANAL) 1 % EX OINT: 1.0000 "application " | TOPICAL_OINTMENT | CUTANEOUS | Status: DC | PRN

## 2021-09-10 MED ORDER — DIPHENHYDRAMINE HCL 25 MG PO CAPS: 25.0000 mg | ORAL_CAPSULE | Freq: Four times a day (QID) | ORAL | Status: DC | PRN

## 2021-09-10 MED ORDER — TETANUS-DIPHTH-ACELL PERTUSSIS 5-2.5-18.5 LF-MCG/0.5 IM SUSY
0.5000 mL | PREFILLED_SYRINGE | Freq: Once | INTRAMUSCULAR | Status: DC
Start: 2021-09-11 — End: 2021-09-13

## 2021-09-10 MED ORDER — ENOXAPARIN SODIUM 40 MG/0.4ML IJ SOSY: 40.0000 mg | PREFILLED_SYRINGE | INTRAMUSCULAR | Status: DC

## 2021-09-10 MED ORDER — FENTANYL CITRATE (PF) 100 MCG/2ML IJ SOLN: 50.0000 ug | INTRAMUSCULAR | Status: DC | PRN

## 2021-09-10 MED ORDER — DIPHENHYDRAMINE HCL 50 MG/ML IJ SOLN: 12.5000 mg | Freq: Four times a day (QID) | INTRAMUSCULAR | Status: DC | PRN

## 2021-09-10 MED ORDER — BUPIVACAINE IN DEXTROSE 0.75-8.25 % IT SOLN
INTRATHECAL | Status: DC | PRN
  Administered 2021-09-10: 1 mL via INTRATHECAL
  Administered 2021-09-10: .25 mL via INTRATHECAL
  Administered 2021-09-10: .3 mL via INTRATHECAL

## 2021-09-10 MED ORDER — PHENYLEPHRINE HCL (PRESSORS) 10 MG/ML IV SOLN
INTRAVENOUS | Status: DC | PRN
  Administered 2021-09-10: 80 ug via INTRAVENOUS

## 2021-09-10 MED ORDER — WITCH HAZEL-GLYCERIN EX PADS: 1.0000 "application " | MEDICATED_PAD | CUTANEOUS | Status: DC | PRN

## 2021-09-10 MED ORDER — SODIUM CHLORIDE 0.9 % IV SOLN
INTRAVENOUS | Status: DC | PRN
  Administered 2021-09-10: 60 ug/min via INTRAVENOUS

## 2021-09-10 MED ORDER — FENTANYL CITRATE (PF) 100 MCG/2ML IJ SOLN
INTRAMUSCULAR | Status: DC | PRN
  Administered 2021-09-10: 10 ug via INTRATHECAL

## 2021-09-10 MED ORDER — KETOROLAC TROMETHAMINE 30 MG/ML IJ SOLN
30.0000 mg | Freq: Four times a day (QID) | INTRAMUSCULAR | Status: AC
  Administered 2021-09-11 (×4): 30 mg via INTRAVENOUS
  Filled 2021-09-10 (×4): qty 1

## 2021-09-10 MED ORDER — FENTANYL CITRATE (PF) 100 MCG/2ML IJ SOLN
25.0000 ug | INTRAMUSCULAR | Status: DC | PRN
  Administered 2021-09-10: 25 ug via INTRAVENOUS

## 2021-09-10 MED ORDER — ONDANSETRON HCL 4 MG/2ML IJ SOLN
INTRAMUSCULAR | Status: AC
  Filled 2021-09-10: qty 2

## 2021-09-10 MED ORDER — MORPHINE SULFATE (PF) 0.5 MG/ML IJ SOLN
INTRAMUSCULAR | Status: DC | PRN
  Administered 2021-09-10: 150 ug via EPIDURAL

## 2021-09-10 MED ORDER — CEFAZOLIN IN SODIUM CHLORIDE 3-0.9 GM/100ML-% IV SOLN: 3.0000 g | INTRAVENOUS | Status: DC

## 2021-09-10 MED ORDER — MORPHINE SULFATE (PF) 0.5 MG/ML IJ SOLN
INTRAMUSCULAR | Status: AC
  Filled 2021-09-10: qty 10

## 2021-09-10 MED ORDER — ONDANSETRON HCL 4 MG/2ML IJ SOLN: 4.0000 mg | Freq: Three times a day (TID) | INTRAMUSCULAR | Status: DC | PRN

## 2021-09-10 MED ORDER — SODIUM CHLORIDE 0.9% FLUSH: 3.0000 mL | INTRAVENOUS | Status: DC | PRN

## 2021-09-10 MED ORDER — MENTHOL 3 MG MT LOZG: 1.0000 | LOZENGE | OROMUCOSAL | Status: DC | PRN

## 2021-09-10 MED ORDER — IBUPROFEN 600 MG PO TABS
600.0000 mg | ORAL_TABLET | Freq: Four times a day (QID) | ORAL | Status: DC
  Administered 2021-09-12 – 2021-09-13 (×6): 600 mg via ORAL
  Filled 2021-09-10 (×6): qty 1

## 2021-09-10 MED ORDER — OXYTOCIN-SODIUM CHLORIDE 30-0.9 UT/500ML-% IV SOLN
INTRAVENOUS | Status: AC
  Filled 2021-09-10: qty 500

## 2021-09-10 MED ORDER — OXYTOCIN-SODIUM CHLORIDE 30-0.9 UT/500ML-% IV SOLN: 2.5000 [IU]/h | INTRAVENOUS | Status: AC

## 2021-09-10 MED ORDER — SOD CITRATE-CITRIC ACID 500-334 MG/5ML PO SOLN: 30.0000 mL | ORAL | Status: DC

## 2021-09-10 MED ORDER — OXYTOCIN-SODIUM CHLORIDE 30-0.9 UT/500ML-% IV SOLN
INTRAVENOUS | Status: DC | PRN
  Administered 2021-09-10: 500 mL via INTRAVENOUS

## 2021-09-10 MED ORDER — FENTANYL CITRATE (PF) 100 MCG/2ML IJ SOLN: 25.0000 ug | INTRAMUSCULAR | Status: DC | PRN

## 2021-09-10 MED ORDER — KETOROLAC TROMETHAMINE 30 MG/ML IJ SOLN: 30.0000 mg | Freq: Four times a day (QID) | INTRAMUSCULAR | Status: DC | PRN

## 2021-09-10 MED ORDER — NALOXONE HCL 0.4 MG/ML IJ SOLN: 0.4000 mg | INTRAMUSCULAR | Status: DC | PRN

## 2021-09-10 MED ORDER — FENTANYL CITRATE (PF) 100 MCG/2ML IJ SOLN
INTRAMUSCULAR | Status: DC | PRN
  Administered 2021-09-10: 15 ug via INTRAVENOUS

## 2021-09-10 MED ORDER — KETOROLAC TROMETHAMINE 30 MG/ML IJ SOLN: 30.0000 mg | Freq: Once | INTRAMUSCULAR | Status: DC

## 2021-09-10 MED ORDER — NALBUPHINE HCL 10 MG/ML IJ SOLN: 5.0000 mg | INTRAMUSCULAR | Status: DC | PRN

## 2021-09-10 MED ORDER — ACETAMINOPHEN 500 MG PO TABS: 1000.0000 mg | ORAL_TABLET | Freq: Four times a day (QID) | ORAL | Status: DC

## 2021-09-10 MED ORDER — LACTATED RINGERS IV SOLN: INTRAVENOUS | Status: DC | PRN

## 2021-09-10 MED ORDER — ACETAMINOPHEN 500 MG PO TABS
1000.0000 mg | ORAL_TABLET | Freq: Once | ORAL | Status: AC
  Administered 2021-09-10: 1000 mg via ORAL
  Filled 2021-09-10: qty 2

## 2021-09-10 MED ORDER — SIMETHICONE 80 MG PO CHEW
80.0000 mg | CHEWABLE_TABLET | ORAL | Status: DC | PRN
  Administered 2021-09-11: 80 mg via ORAL
  Filled 2021-09-10: qty 1

## 2021-09-10 MED ORDER — PROMETHAZINE HCL 25 MG/ML IJ SOLN: 6.2500 mg | INTRAMUSCULAR | Status: DC | PRN

## 2021-09-10 MED ORDER — ACETAMINOPHEN 160 MG/5ML PO SOLN: 1000.0000 mg | Freq: Once | ORAL | Status: DC

## 2021-09-10 MED ORDER — KETOROLAC TROMETHAMINE 30 MG/ML IJ SOLN
30.0000 mg | Freq: Four times a day (QID) | INTRAMUSCULAR | Status: DC | PRN
  Administered 2021-09-10: 30 mg via INTRAVENOUS

## 2021-09-10 MED ORDER — COCONUT OIL OIL
1.0000 "application " | TOPICAL_OIL | Status: DC | PRN
  Administered 2021-09-11: 1 via TOPICAL

## 2021-09-10 MED ORDER — SODIUM CHLORIDE 0.9 % IV SOLN
3.0000 g | Freq: Four times a day (QID) | INTRAVENOUS | Status: DC
  Administered 2021-09-10 – 2021-09-12 (×6): 3 g via INTRAVENOUS
  Filled 2021-09-10 (×9): qty 8

## 2021-09-10 MED ORDER — PHENYLEPHRINE HCL-NACL 20-0.9 MG/250ML-% IV SOLN
INTRAVENOUS | Status: AC
  Filled 2021-09-10: qty 250

## 2021-09-10 SURGICAL SUPPLY — 40 items
BENZOIN TINCTURE PRP APPL 2/3 (GAUZE/BANDAGES/DRESSINGS) ×2 IMPLANT
CHLORAPREP W/TINT 26ML (MISCELLANEOUS) ×2 IMPLANT
CLAMP CORD UMBIL (MISCELLANEOUS) IMPLANT
CLOTH BEACON ORANGE TIMEOUT ST (SAFETY) ×2 IMPLANT
DRESSING PREVENA PLUS CUSTOM (GAUZE/BANDAGES/DRESSINGS) ×2 IMPLANT
DRSG OPSITE POSTOP 4X10 (GAUZE/BANDAGES/DRESSINGS) ×2 IMPLANT
DRSG PREVENA PLUS CUSTOM (GAUZE/BANDAGES/DRESSINGS) ×4
ELECT REM PT RETURN 9FT ADLT (ELECTROSURGICAL) ×2
ELECTRODE REM PT RTRN 9FT ADLT (ELECTROSURGICAL) ×1 IMPLANT
EXTRACTOR VACUUM M CUP 4 TUBE (SUCTIONS) IMPLANT
GLOVE BIO SURGEON STRL SZ7.5 (GLOVE) ×2 IMPLANT
GLOVE BIOGEL PI IND STRL 7.0 (GLOVE) ×1 IMPLANT
GLOVE BIOGEL PI IND STRL 7.5 (GLOVE) ×1 IMPLANT
GLOVE BIOGEL PI INDICATOR 7.0 (GLOVE) ×1
GLOVE BIOGEL PI INDICATOR 7.5 (GLOVE) ×1
GOWN STRL REUS W/TWL LRG LVL3 (GOWN DISPOSABLE) ×4 IMPLANT
HEMOSTAT ARISTA ABSORB 3G PWDR (HEMOSTASIS) ×2 IMPLANT
HOVERMATT SINGLE USE (MISCELLANEOUS) ×2 IMPLANT
KIT ABG SYR 3ML LUER SLIP (SYRINGE) IMPLANT
NEEDLE HYPO 25X5/8 SAFETYGLIDE (NEEDLE) IMPLANT
NS IRRIG 1000ML POUR BTL (IV SOLUTION) ×2 IMPLANT
PACK C SECTION WH (CUSTOM PROCEDURE TRAY) ×2 IMPLANT
PAD ABD 8X10 STRL (GAUZE/BANDAGES/DRESSINGS) ×2 IMPLANT
PAD OB MATERNITY 4.3X12.25 (PERSONAL CARE ITEMS) ×2 IMPLANT
PENCIL SMOKE EVAC W/HOLSTER (ELECTROSURGICAL) ×2 IMPLANT
RETRACTOR TRAXI PANNICULUS (MISCELLANEOUS) ×2 IMPLANT
RTRCTR C-SECT PINK 25CM LRG (MISCELLANEOUS) ×2 IMPLANT
STRIP CLOSURE SKIN 1/2X4 (GAUZE/BANDAGES/DRESSINGS) ×2 IMPLANT
SUT CHROMIC 2 0 CT 1 (SUTURE) ×2 IMPLANT
SUT MNCRL AB 3-0 PS2 27 (SUTURE) ×2 IMPLANT
SUT PLAIN 2 0 XLH (SUTURE) ×2 IMPLANT
SUT VIC AB 0 CT1 36 (SUTURE) ×2 IMPLANT
SUT VIC AB 0 CTX 36 (SUTURE) ×6
SUT VIC AB 0 CTX36XBRD ANBCTRL (SUTURE) ×3 IMPLANT
SUT VIC AB 2-0 SH 27 (SUTURE)
SUT VIC AB 2-0 SH 27XBRD (SUTURE) IMPLANT
TOWEL OR 17X24 6PK STRL BLUE (TOWEL DISPOSABLE) ×2 IMPLANT
TRAXI PANNICULUS RETRACTOR (MISCELLANEOUS) ×2
TRAY FOLEY W/BAG SLVR 14FR LF (SET/KITS/TRAYS/PACK) ×2 IMPLANT
WATER STERILE IRR 1000ML POUR (IV SOLUTION) ×2 IMPLANT

## 2021-09-10 NOTE — Progress Notes (Addendum)
Called by [facility], CNM with report that pt's cervix is still only 2 cm after serial day induction now with fetal and maternal tachycardia with fever and several late decelerations with decreased variability and presumption of chorioamnionitis.  Pitocin was discontinued and after discussing with the patient her options, pt would like to proceed with c-section.  Lesly Rubenstein had also spoken with anesthesia.  She reported that they would only do the c-section during the day before 7p due to decreased staffing tonight.  Upon my arrival fetal tachycardia had resolved.  Pt would like to proceed with c-section.  Risks benefits and alternatives reviewed with the patient including but not limited to bleeding infection and injury with possible poor wound healing d/t BMI 67 and pannus.  Will place a wound vac.  I also reviewed with the patient her diagnosis of antithrombin III deficiency options and recs post partum.  Will do lovenox in the hospital.  Patient anticipating a boy Helaire and would like him circumcised prior to discharge.  Pt and family did not have any questions and consent signed and witnessed.

## 2021-09-10 NOTE — Op Note (Addendum)
Cesarean Section Procedure Note  Indications: P0 at 64 1/7wks with fetal intolerance of labor remote from delivery and presumptive chorioamnionitis undergoing serial day IOL for GHTN ready to proceed with cesarean section.  Pre-operative Diagnosis: 1.38 1/7wks 2.Fetal Intolerance of Labor remote from delivery 3.Presumptive Chorioamnionitis   Post-operative Diagnosis: 1.38 1/7wks 2.Fetal Intolerance of Labor remote from delivery 3.Presumptive Chorioamnionitis  Procedure: Primary Low Transverse Cesarean Section  Surgeon: Osborn Coho, MD    Assistants: [employer], CNM  Anesthesia: Regional  Anesthesiologist: Kaylyn Layer, MD   Procedure Details  The patient was taken to the operating room secondary to fetal intolerance of labor remote from delivery after the risks, benefits, complications, treatment options, and expected outcomes were discussed with the patient.  The patient concurred with the proposed plan, giving informed consent which was signed and witnessed. The patient was taken to Operating Room C, identified as [name] and the procedure verified as C-Section Delivery. A Time Out was held and the above information confirmed.  After induction of anesthesia by obtaining a surgical level via ITC, the patient was prepped and draped in the usual sterile manner. A Pfannenstiel skin incision was made and carried down through the subcutaneous tissue to the underlying layer of fascia.  The fascia was incised bilaterally and extended transversely bilaterally with the Mayo scissors. Kocher clamps were placed on the inferior aspect of the fascial incision and the underlying rectus muscle was separated from the fascia. The same was done on the superior aspect of the fascial incision.  The peritoneum was identified, entered bluntly and extended manually.  An Alexis self-retaining retractor was placed.  The utero-vesical peritoneal reflection was incised transversely and the bladder flap  was bluntly freed from the lower uterine segment. A low transverse uterine incision was made with the scalpel and extended bilaterally with the bandage scissors.  The infant was delivered in vertex position (LOP) without difficulty.  After the umbilical cord was clamped and cut, the infant was handed to the awaiting pediatricians.  Cord blood was obtained for evaluation.  The placenta was removed intact and appeared to be within normal limits. The uterus was cleared of all clots and debris. The uterine incision was closed with running interlocking sutures of 0 Vicryl and a second imbricating layer was performed as well.   Several figure of eight stitches were applied for hemostasis and arista placed.  Bilateral tubes and ovaries appeared to be within normal limits.  Good hemostasis was noted.  Copious irrigation was performed until clear.  The peritoneum was repaired with 2-0 chromic via a running suture.  The fascia was reapproximated with a running suture of 0 Vicryl. The subcutaneous tissue was reapproximated with 3 interrupted sutures of 2-0 plain.  The skin was reapproximated with a subcuticular suture of 3-0 monocryl.  Steristrips were applied.  Instrument, sponge, and needle counts were correct prior to abdominal closure and at the conclusion of the case.  The patient was awaiting transfer to the recovery room in good condition.  Findings: Live female infant with Apgars not entered in delivery summary.  Note in baby's chart report apgars of 8 at one minute 7 at 5 minute and 9 at .  The baby came out vigorous but once transferred to warmer required CPAP and was taken to the NICU overall doing well and vigorous.  Normal appearing bilateral ovaries and fallopian tubes were noted.   Estimated Blood Loss:  1422 ml         Drains:  foley to gravity amber urine 100 cc         Total IV Fluids: 2300 ml         Specimens to Pathology: Placenta         Complications:  None; patient tolerated the  procedure well.         Disposition: PACU - hemodynamically stable.         Condition: stable  Attending Attestation: I performed the procedure.  I was present and scrubbed and the assistant was required due to complexity of anatomy.

## 2021-09-10 NOTE — Progress Notes (Signed)
Labor Progress Note  Shannon Francis is a 30 y.o. female G3P0020 on 9/28 was [redacted]w[redacted]d sent from office for IOL for increasing BP, A1 GDM and previous fetal growth restriction. Mild range BP over past 2 weeks, BP 150/90 in office today. Pt ruled in for gest hypertension. Pre-e work-p unremarkable, PCR 0.07.  H/O HSV no lesions, obesity BMI 67. Pt denies HA, visual changes, and RUQ pain. Current GA [redacted] weeks.   Subjective: Pt getting fatigued, pt ready to meet her NB, discussed POC, and pt aggravation and risk of PCS, no change has been noted with cervical change, continue with pitocin, pt felt pain on right side, anesthesia called, and bolus spinal epidural.  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 Total I/O In: -  Out: 1500 [Urine:1500] NST: FHR baseline 145 bpm, Variability: moderate, Accelerations:present, Decelerations:  Absent= Cat 1/Reactive CTX:  4-5, lasting 60-90 seconds Uterus gravid, soft non tender, moderate to palpate with contractions.  SVE:  Dilation: 1.5 Effacement (%): 90 Station: -2 Exam by:: [email or web address], CNM Pitocin at 12 mUn/min IUPC Placed with ease, MVU 180-200  Assessment:  Shannon Francis is a 30 y.o. female G3P0020 on 9/28 was [redacted]w[redacted]d sent from office for IOL for increasing BP, A1 GDM and previous fetal growth restriction. Mild range BP over past 2 weeks, BP 150/90 in office today. Pt ruled in for gest hypertension. Pre-e work-p unremarkable, PCR 0.07.  H/O HSV  no lesions, obesity BMI 67 Pt denies HA, visual changes, and RUQ pain. Current GA [redacted] weeks. Prodromal early labor, no cervical change. 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   Gestational hypertension 09/07/2021   INFECTION, HUMAN PAPILLOMAVIRUS 06/10/2007   HYPERTROPHY, BREAST 05/10/2007   OBESITY, NOS 02/07/2007   ACNE 02/07/2007   NICHD: Category 1  Membranes:  Possible and suspected SROM, clear at 1300 on 9/30, no s/s of infection  IUPC placed MVU : 180-200s  Induction:    Cytotec x 4 on 9/28 @ 1803, 2104, on 9/29 @ 0204, 1030 Foley Bulb: Attempted on 9/29, failed, currently suspected ROM  Pitocin - 12 now restarted at MN, started at 1500 on 9/29, started low dose, and increase today to 40 @ 1600 9/30 stopped  Pain management:               IV pain management: x 9/29 @ 0510, 0601, 0710, 2350, 9/30 @ 0524             Epidural placement:  at 0800 on 9/30 **INTRATHECAL CATHETER** Rebolused  GBS Negative  A1GDM: stable  CBG (last 3)  Recent Labs    09/09/21 2106 09/10/21 0141 09/10/21 0542  GLUCAP 108* 122* 89   GHTN: X3 IV labetalol for SR BP, although cuff to small for pt, asymptomatic, PCR  0.07, current BP 130/70, other labs unremarkable.   Plan: Continue labor plan GDMA1: CBG Q4H GHTN: monitor BP, labetalol protocol PRN, if BP severe range will recheck labs.  HSV: Monitor for lesions.  Continuous monitoring Rest Frequent position changes to facilitate fetal rotation and descent. Will reassess with cervical exam in 4 hours or earlier if necessary Continue pitocin 2x2 SCD ordered r/t BMI 67 Anticipate labor progression and vaginal delivery.   DR Su Hilt to assume care at 0700, will give report.   Shannon Bland, NP-C, CNM, MSN 09/10/2021. 6:51 AM

## 2021-09-10 NOTE — Transfer of Care (Addendum)
Immediate Anesthesia Transfer of Care Note  Patient: Shannon Francis  Procedure(s) Performed: CESAREAN SECTION  Patient Location: PACU  Anesthesia Type:Spinal  Level of Consciousness: awake, alert , oriented and patient cooperative  Airway & Oxygen Therapy: Patient Spontanous Breathing and Patient connected to nasal cannula oxygen  Post-op Assessment: Report given to RN and Post -op Vital signs reviewed and stable  Post vital signs: Reviewed and stable  Last Vitals:  Vitals Value Taken Time  BP 122/65 09/10/21 1845  Temp 37 C 09/10/21 1825  Pulse 89 09/10/21 1857  Resp 23 09/10/21 1857  SpO2 95 % 09/10/21 1857  Vitals shown include unvalidated device data.  Last Pain:  Vitals:   09/10/21 1854  TempSrc:   PainSc: 3       Patients Stated Pain Goal: 0 (09/10/21 0457)  Complications: No notable events documented.

## 2021-09-10 NOTE — Progress Notes (Signed)
Labor Progress Note  Shannon Francis is a 30 y.o. female G3P0020 on 9/28 was [redacted]w[redacted]d sent from office for IOL for increasing BP, A1 GDM and previous fetal growth restriction. Mild range BP over past 2 weeks, BP 150/90 in office today. Pt ruled in for gest hypertension. Pre-e work-p unremarkable, PCR 0.07.  H/O HSV no lesions, obesity BMI 67. Pt denies HA, visual changes, and RUQ pain. Current GA [redacted] weeks.   Subjective: Pt in bed in NAD, RN report fetal tachycardia, maternal tachycardia, and maternal fever 101, tylenol 1000mg  given, fluid bolus given, and Unasyn 3gm started, suspected chorio, I recommended PCS r/t remote from delivery at 2 cm no change in cervix and cat 2 strips with 20 mins of repetitive late decels noted @ 1312. Pt mother report also needing PCS for not dilatating past 1 cm as well. Suspect CPD. MVU 220 and been adequate for last 6 hours with minimal to no change, although fetal descents was noted. 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   Gestational hypertension 09/07/2021   INFECTION, HUMAN PAPILLOMAVIRUS 06/10/2007   HYPERTROPHY, BREAST 05/10/2007   OBESITY, NOS 02/07/2007   ACNE 02/07/2007   Objective: BP (!) 145/69   Pulse (!) 122   Temp 99.8 F (37.7 C) (Oral)   Resp 20   Ht 5\' 5"  (1.651 m)   Wt (!) 183.9 kg   LMP 12/17/2020 (Exact Date)   SpO2 96%   BMI 67.48 kg/m  I/O last 3 completed shifts: In: -  Out: 1500 [Urine:1500] Total I/O In: -  Out: 250 [Urine:250] NST: FHR baseline 160 bpm, Variability: moderate, Accelerations:present, Decelerations: repetitive decels noted= Cat 2/Reactive CTX:  2-4, lasting 60-90 seconds Uterus gravid, soft non tender, moderate to palpate with contractions.  SVE:  Dilation: 2 Effacement (%): 90 Station: 0 Exam by:: 02/14/2021, NP Pitocin at 24 and stopped mUn/min IUPC Placed with ease, MVU 220s  Assessment:  Shannon Francis is a 30 y.o. female G3P0020 on 9/28 was [redacted]w[redacted]d sent from office for IOL for increasing BP, A1 GDM and previous fetal growth restriction. Mild range BP over past 2 weeks, BP 150/90 in office today. Pt ruled in for gest hypertension. Pre-e work-p unremarkable, PCR 0.07.  H/O HSV no lesions, obesity BMI 67 Pt denies HA, visual changes, and RUQ pain. Current GA [redacted] weeks. Prodromal early labor, suspected chorio, with Cat 2 late decel noted, pit stopped, plan to proceed with PCS.  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   IUPC MVU : 220s  Induction:    Cytotec x 4 on 9/28 @ 1803, 2104, on 9/29 @ 0204, 1030 Foley Bulb: Attempted on 9/29, failed, currently suspected ROM  Pitocin - 24 then stopped, started at 1500 on 9/29, started low dose, and increase today to 40 @ 1600 9/30 stopped, restarted at MN 10/1  Pain management:               IV pain management: x 9/29 @ 0510, 0601, 0710, 2350, 9/30 @ 0524             Epidural placement:  at 0800 on 9/30 **INTRATHECAL CATHETER** Rebolused @ 0500 10/1  GBS Negative  A1GDM: stable  CBG (last 3)  Recent Labs    09/10/21 0542 09/10/21 0942 09/10/21 1457  GLUCAP 89 76 97   GHTN: X3 IV labetalol for SR BP, although cuff to small for pt, asymptomatic, PCR 0.07, current BP 138/58, other labs unremarkable.   Plan: Chorio: start Unasyn 3gm Q6H, 1000mg  tylenol  ordered and given, fluid bolus. GDMA1: CBG Q4H GHTN: monitor BP, labetalol protocol PRN, if BP severe range will recheck labs.  HSV: Monitor for lesions.  Continuous monitoring Stop pitocin SCD ordered r/t BMI 67 Anticipate PCS Ancef order for OR.  Bicitra  DR en route  [facility], NP-C, CNM, MSN 09/10/2021. 3:02 PM

## 2021-09-10 NOTE — Progress Notes (Signed)
Order per Dr. Stephannie Peters. Wean pt off oxygen if Sats remain 93% and greater. Put patient back on Oxygen at bedtime.

## 2021-09-10 NOTE — Anesthesia Postprocedure Evaluation (Signed)
Anesthesia Post Note  Patient: MACKYNZIE WOOLFORD  Procedure(s) Performed: CESAREAN SECTION     Patient location during evaluation: PACU Anesthesia Type: Spinal Level of consciousness: awake and alert and oriented Pain management: pain level controlled Vital Signs Assessment: post-procedure vital signs reviewed and stable Respiratory status: spontaneous breathing, nonlabored ventilation and respiratory function stable Cardiovascular status: blood pressure returned to baseline Postop Assessment: no apparent nausea or vomiting, spinal receding, no headache and no backache Anesthetic complications: no   No notable events documented.  Last Vitals:  Vitals:   09/10/21 1930 09/10/21 1943  BP: (!) 143/79   Pulse: 88 79  Resp: 20 (!) 21  Temp:    SpO2: 91% 93%    Last Pain:  Vitals:   09/10/21 1943  TempSrc:   PainSc: 0-No pain   Pain Goal: Patients Stated Pain Goal: 0 (09/10/21 0457)                 Shanda Howells

## 2021-09-11 ENCOUNTER — Encounter (HOSPITAL_COMMUNITY): Payer: Self-pay | Admitting: Obstetrics and Gynecology

## 2021-09-11 LAB — CBC
HCT: 30.8 % — ABNORMAL LOW (ref 36.0–46.0)
Hemoglobin: 9.8 g/dL — ABNORMAL LOW (ref 12.0–15.0)
MCH: 27 pg (ref 26.0–34.0)
MCHC: 31.8 g/dL (ref 30.0–36.0)
MCV: 84.8 fL (ref 80.0–100.0)
Platelets: 181 10*3/uL (ref 150–400)
RBC: 3.63 MIL/uL — ABNORMAL LOW (ref 3.87–5.11)
RDW: 15.8 % — ABNORMAL HIGH (ref 11.5–15.5)
WBC: 14.1 10*3/uL — ABNORMAL HIGH (ref 4.0–10.5)
nRBC: 0 % (ref 0.0–0.2)

## 2021-09-11 LAB — COMPREHENSIVE METABOLIC PANEL
ALT: 14 U/L (ref 0–44)
AST: 14 U/L — ABNORMAL LOW (ref 15–41)
Albumin: 1.8 g/dL — ABNORMAL LOW (ref 3.5–5.0)
Alkaline Phosphatase: 91 U/L (ref 38–126)
Anion gap: 6 (ref 5–15)
BUN: 9 mg/dL (ref 6–20)
CO2: 23 mmol/L (ref 22–32)
Calcium: 8.2 mg/dL — ABNORMAL LOW (ref 8.9–10.3)
Chloride: 106 mmol/L (ref 98–111)
Creatinine, Ser: 0.92 mg/dL (ref 0.44–1.00)
GFR, Estimated: >60 mL/min (ref >60)
Glucose, Bld: 88 mg/dL (ref 70–99)
Potassium: 4.1 mmol/L (ref 3.5–5.1)
Sodium: 135 mmol/L (ref 135–145)
Total Bilirubin: 1.5 mg/dL — ABNORMAL HIGH (ref 0.3–1.2)
Total Protein: 4.8 g/dL — ABNORMAL LOW (ref 6.5–8.1)

## 2021-09-11 LAB — GLUCOSE, CAPILLARY: Glucose-Capillary: 80 mg/dL (ref 70–99)

## 2021-09-11 MED ORDER — BUTALBITAL-APAP-CAFFEINE 50-325-40 MG PO TABS
1.0000 | ORAL_TABLET | Freq: Four times a day (QID) | ORAL | Status: DC | PRN
  Administered 2021-09-12: 1 via ORAL
  Filled 2021-09-11: qty 1

## 2021-09-11 MED ORDER — OXYCODONE HCL 5 MG PO TABS
10.0000 mg | ORAL_TABLET | ORAL | Status: DC | PRN
Start: 2021-09-11 — End: 2021-09-13
  Administered 2021-09-11 – 2021-09-13 (×4): 10 mg via ORAL
  Filled 2021-09-11 (×4): qty 2

## 2021-09-11 MED ORDER — ENOXAPARIN SODIUM 100 MG/ML IJ SOSY
100.0000 mg | PREFILLED_SYRINGE | INTRAMUSCULAR | Status: DC
  Administered 2021-09-12 (×2): 100 mg via SUBCUTANEOUS
  Filled 2021-09-11 (×3): qty 1

## 2021-09-11 MED ORDER — ACETAMINOPHEN 500 MG PO TABS
1000.0000 mg | ORAL_TABLET | Freq: Four times a day (QID) | ORAL | Status: DC
  Administered 2021-09-11 – 2021-09-13 (×6): 1000 mg via ORAL
  Filled 2021-09-11 (×5): qty 2

## 2021-09-11 MED ORDER — ACETAMINOPHEN 500 MG PO TABS
1000.0000 mg | ORAL_TABLET | Freq: Once | ORAL | Status: DC
  Filled 2021-09-11: qty 2

## 2021-09-11 MED ORDER — CYCLOBENZAPRINE HCL 10 MG PO TABS
10.0000 mg | ORAL_TABLET | Freq: Three times a day (TID) | ORAL | Status: DC | PRN
  Administered 2021-09-11 – 2021-09-12 (×3): 10 mg via ORAL
  Filled 2021-09-11 (×3): qty 1

## 2021-09-11 NOTE — Lactation Note (Signed)
This note was copied from a baby's chart. Lactation Consultation Note  Patient Name: Shannon Francis [id number] Date: 09/11/2021   Age:30 hours  Attempted to visit with mom but RN Rose Phi and NP were working with her. RN Rose Phi has already set up a pump this morning, asked LC to come back later this afternoon to see mom, she hasn't been feeling well enough to pump yet. NICU LC to follow up later today.   Kodee Drury S Ilma Achee 09/11/2021, 11:49 AM

## 2021-09-11 NOTE — Lactation Note (Signed)
This note was copied from a baby's chart. Lactation Consultation Note  Patient Name: Shannon Francis [id number] Date: 09/11/2021 Reason for consult: Initial assessment;Primapara;1st time breastfeeding;NICU baby;Early term 37-38.6wks Age:30 hours  Visited with mom of 21 hours old ETI NICU female, she's a P1 and reported moderate breast changes during the pregnancy. LC assisted with hand expression, mom has flat nipples with non compressible tissue, with pitting edema.  She has been set up with a DEBP but she hasn't pumped yet, encouraged mom to start doing so, this consult took place in baby's room in the NICU.   Reviewed pumping schedule, lactogenesis II, expectations and benefits of breastmilk for NICU babies.  Plan of care:  Encouraged mom to start pumping every 2-3 hours, at least 8 pumping sessions/24 hours. Breast massage, hand expression and coconut oil (asked RN Deven for it) were also encouraged prior pumping  BF brochure, BF resources and NICU booklet were reviewed. GOB (maternal) present and supportive. All questions and concerns answered, mom to call NICU LC PRN.  Maternal Data Has patient been taught Hand Expression?: Yes Does the patient have breastfeeding experience prior to this delivery?: No  Feeding Mother's Current Feeding Choice: Breast Milk and Formula  Lactation Tools Discussed/Used Tools: Pump Breast pump type: Double-Electric Breast Pump Pump Education: Setup, frequency, and cleaning;Milk Storage Reason for Pumping: ETI in NICU Pumping frequency: q 3 hours was recommended Pumped volume: 0 mL (hasn't started pumping yet)  Interventions Interventions: Breast feeding basics reviewed;Breast massage;Hand express;DEBP;Education  Discharge Pump: DEBP;Personal (Spectra S2) WIC Program: No  Consult Status Consult Status: Follow-up Date: 09/11/21 Follow-up type: In-patient   Shannon Francis 09/11/2021, 2:01 PM

## 2021-09-11 NOTE — Progress Notes (Addendum)
Shannon Francis [id number] Postpartum Postoperative Day # 1 Shannon Francis, G3P0020, [redacted]w[redacted]d, S/P LT Cesarean Section due to NRFHT.   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   Gestational hypertension 09/07/2021   INFECTION, HUMAN PAPILLOMAVIRUS 06/10/2007   HYPERTROPHY, BREAST 05/10/2007   OBESITY, NOS 02/07/2007   ACNE 02/07/2007     Active Ambulatory Problems    Diagnosis Date Noted   INFECTION, HUMAN PAPILLOMAVIRUS 06/10/2007   OBESITY, NOS 02/07/2007   HYPERTROPHY, BREAST 05/10/2007   ACNE 02/07/2007   Resolved Ambulatory Problems    Diagnosis Date Noted   EAR PAIN, LEFT 10/15/2007   EXCESSIVE MENSTRUATION 04/01/2007   KNEE MENISCUS INJURY, UNSPECIFIED 02/07/2007   Gestational diabetes mellitus (GDM), antepartum 08/03/2021   Past Medical History:  Diagnosis Date   Headache    Medical history non-contributory    Obesity      Subjective: Patient has not gotten up yet. Nurse to ambulate with patient after rounds. Reports consuming regular diet without issues and denies N/V. Patient reportsno bowel movement confirms passing flatus.  Foley remains in place.  Moderate bleeding Patient is breast/bottle feeding. Baby in NICU.  Pain is being appropriately managed with use of PO medications.    Objective: Patient Vitals for the past 24 hrs:  BP Temp Temp src Pulse Resp SpO2  09/11/21 1045 -- -- -- -- -- 98 %  09/11/21 0836 (!) 101/47 98.2 F (36.8 C) Oral 91 20 99 %  09/11/21 0700 -- -- -- -- -- (P) 97 %  09/11/21 0540 -- -- -- -- -- 96 %  09/11/21 0526 (!) 103/58 98.1 F (36.7 C) Oral 87 18 97 %  09/11/21 0456  -- -- -- -- -- 97 %  09/11/21 0413 -- -- -- -- -- 96 %  09/11/21 0330 -- -- -- -- -- 96 %  09/11/21 0220 -- -- -- -- -- 96 %  09/11/21 0121 97/65 99 F (37.2 C) Oral (!) 104 18 --  09/11/21 0113 119/65 -- -- (!) 103 20 98 %  09/11/21 0100 (!) 133/91 -- -- (!) 103 (!) 25 98 %  09/10/21 2300 -- -- -- -- -- 95 %  09/10/21 2200 -- -- -- -- -- 97 %  09/10/21 2100 136/63 98 F (36.7 C) Oral 89 15 95 %  09/10/21 2000 (!) 159/89 (!) 97.5 F (36.4 C) Oral 85 15 94 %  09/10/21 1943 -- -- -- 79 (!) 21 93 %  09/10/21 1930 (!) 143/79 -- -- 88 20 91 %  09/10/21 1915 133/86 -- -- 87 17 96 %  09/10/21 1900 140/65 98.8 F (37.1 C) -- 80 (!) 21 97 %  09/10/21 1845 122/65 -- -- 97 (!) 28 --  09/10/21 1825 135/71 98.6 F (37 C) -- 90 -- 95 %  09/10/21 1815 135/71 -- -- 88 (!) 25 94 %  09/10/21 1459 (!) 145/69 99.8 F (37.7 C) Oral (!) 122 -- --  09/10/21 1401 131/88 -- -- (!) 123 20 96 %  09/10/21 1400 -- -- -- -- -- 96 %  09/10/21 1315 -- (!) 101.5 F (38.6 C) Oral -- -- --  09/10/21 1300 Marland Kitchen)  147/75 -- -- (!) 113 18 92 %     Physical Exam:  General: alert and cooperative Lungs: clear to auscultation, no wheezes, rales or rhonchi, symmetric air entry.  Heart: normal rate, regular rhythm, normal S1, S2, no murmurs, rubs, clicks or gallops. Breast: breasts appear normal, no suspicious masses, no skin or nipple changes or axillary nodes. Abdomen:  + bowel sounds, soft, non-tender Incision: no significant drainage, no dehiscence, no significant erythema, Wound vac placed with Martyn Malay, RN Uterine Fundus: firm,  Lochia: appropriate Skin: Warm, Dry. DVT Evaluation: No evidence of DVT seen on physical exam. SCDs in place   Labs: Recent Labs    09/09/21 0641 09/10/21 2052 09/11/21 0416  HGB 12.8 11.6* 9.8*  HCT 39.9 35.3* 30.8*  WBC 9.2 15.6* 14.1*    CBG (last 3)  Recent Labs    09/10/21 1457 09/10/21 1935 09/11/21 0540  GLUCAP 97 88 80     I/O: I/O last 3 completed  shifts: In: 2800 [P.O.:800; I.V.:2000] Out: 3902 [Urine:2400; Blood:1502]   Assessment Postpartum Postoperative Day # 1. Shannon Francis, G3P0020, [redacted]w[redacted]d, S/P LT Cesarean Section due to NRFHT.  Pt stable. Bottle/breast Feeding. Hemodynamically Stable. Wound vac applied to surgical wound with Martyn Malay, RN.   Plan: Continue other mgmt as ordered GTHN- Normotensive today. Labs stable.  GDB - diet controlled. FBS - 80 Will d/c postprandiol BS checks.  PPH - Hgb 9.8. Will start Fe during admission BMI >40 -Begin Lovenox this evening VTE Prophylactics: SCD, ambulated as tolerates.  Chorio during labor - continue Unasyn until 24 hours PP Pain control: Motrin/Tylenol/Toradol  Plan for discharge tomorrow  Dr. Su Hilt to be updated on patient status  Oley Balm NP-C, CNM 09/11/2021, 12:41 PM

## 2021-09-11 NOTE — Progress Notes (Signed)
Rn called [facility] CNm  regarding the patient dropping into the 70's with O2 saturation. Mostly will range 93-97 %. [facility] CNM stated that this happened during her 4 day stay in  Labor and delivery and that is appropriate for her. She has sleep Apnea. The patient will be on a continuous pulse ox for 24 hours.

## 2021-09-12 MED ORDER — NIFEDIPINE ER OSMOTIC RELEASE 30 MG PO TB24
30.0000 mg | ORAL_TABLET | Freq: Every day | ORAL | Status: DC
  Administered 2021-09-12 – 2021-09-13 (×2): 30 mg via ORAL
  Filled 2021-09-12 (×2): qty 1

## 2021-09-12 NOTE — Progress Notes (Signed)
Subjective: Postpartum Day 2: Cesarean Delivery Patient reports feeling much better. She did have a headache, but it has resolved. She is ambulating w/o difficulty, voiding and passing flatus.   She denies RUQ pain, no scotomata, no blurry vision.   Objective: Vital signs in last 24 hours: Temp:  [97.6 F (36.4 C)-98.5 F (36.9 C)] 98.1 F (36.7 C) (10/03 0439) Pulse Rate:  [90-110] 110 (10/03 0439) Resp:  [18-20] 20 (10/03 0439) BP: (137-147)/(83-94) 147/94 (10/03 0439) SpO2:  [98 %-100 %] 100 % (10/03 0439)  Physical Exam:  General: alert, cooperative, and no distress Lochia: appropriate Uterine Fundus: firm Incision: healing well, no significant drainage, no dehiscence, wound vac in place DVT Evaluation: No evidence of DVT seen on physical exam. Negative Homan's sign. No cords or calf tenderness.  Recent Labs    09/10/21 2052 09/11/21 0416  HGB 11.6* 9.8*  HCT 35.3* 30.8*    Assessment/Plan: POD #2 Status post primary Cesarean section. Patient with gestational hypertension - very labile- will start Procardia 30 mg XL daily Will stop Unasyn now as patient is afebrile Baby boy in NICU - will wait until called for circumcision Patient desires to stay another day especially since baby is in NICU  [facility] Shannon Francis 09/12/2021, 11:06 AM

## 2021-09-12 NOTE — Progress Notes (Signed)
Patient screened out for psychosocial assessment since none of the following apply:  Psychosocial stressors documented in mother or baby's chart  Gestation less than 32 weeks  Code at delivery   Infant with anomalies Please contact the Clinical Social Worker if specific needs arise, by MOB's request, or if MOB scores greater than 9/yes to question 10 on [place] Postpartum Depression Screen.  Amarya Kuehl, LCSW Clinical Social Worker [facility] Cell#: [phone]     

## 2021-09-13 ENCOUNTER — Ambulatory Visit: Payer: Medicaid Other

## 2021-09-13 ENCOUNTER — Other Ambulatory Visit (HOSPITAL_COMMUNITY): Payer: Self-pay

## 2021-09-13 LAB — SURGICAL PATHOLOGY

## 2021-09-13 LAB — GLUCOSE, CAPILLARY: Glucose-Capillary: 75 mg/dL (ref 70–99)

## 2021-09-13 MED ORDER — OXYCODONE-ACETAMINOPHEN 7.5-325 MG PO TABS
1.0000 | ORAL_TABLET | ORAL | 0 refills | Status: DC | PRN
  Filled 2021-09-13: qty 30, 5d supply, fill #0

## 2021-09-13 MED ORDER — OXYCODONE-ACETAMINOPHEN 7.5-300 MG PO TABS
1.0000 | ORAL_TABLET | ORAL | 0 refills | Status: DC | PRN
  Filled 2021-09-13: qty 30, 5d supply, fill #0

## 2021-09-13 MED ORDER — CYCLOBENZAPRINE HCL 10 MG PO TABS
10.0000 mg | ORAL_TABLET | Freq: Three times a day (TID) | ORAL | 3 refills | Status: DC | PRN
  Filled 2021-09-13: qty 30, 10d supply, fill #0

## 2021-09-13 MED ORDER — SENNOSIDES-DOCUSATE SODIUM 8.6-50 MG PO TABS
2.0000 | ORAL_TABLET | Freq: Two times a day (BID) | ORAL | 3 refills | Status: DC | PRN
Start: 2021-09-13 — End: 2021-09-22
  Filled 2021-09-13: qty 60, 15d supply, fill #0

## 2021-09-13 MED ORDER — NIFEDIPINE ER 60 MG PO TB24
60.0000 mg | ORAL_TABLET | Freq: Every day | ORAL | 3 refills | Status: DC
  Filled 2021-09-13: qty 60, 60d supply, fill #0

## 2021-09-13 MED ORDER — IBUPROFEN 600 MG PO TABS
600.0000 mg | ORAL_TABLET | Freq: Four times a day (QID) | ORAL | 3 refills | Status: DC | PRN
  Filled 2021-09-13: qty 60, 15d supply, fill #0

## 2021-09-13 NOTE — Lactation Note (Signed)
This note was copied from a baby's chart. Lactation Consultation Note  Patient Name: Shannon Francis NOIBB'C Date: 09/13/2021 Reason for consult: Follow-up assessment;Other (Comment);NICU baby;Primapara;1st time breastfeeding;Maternal endocrine disorder (maternal discharge) Age:30 hours  Visited with mom of 1 hours old ETI NICU female, she's a P1 and going home today. Reviewed discharge education, engorgement prevention/treatment as well as sore nipples. Mom reports no pain/discomfort at the breast when pumping or at rest.  Explained to mom the importance of consistent pumping for the full onset of lactogenesis II, breast massage and reverse pressure techniques were also briefly reviewed.   Maternal Data   Mom hasn't experienced the onset of lactogenesis II yet, no colostrum noted as now, she's at risk of delayed lactogenesis due to risk factors such as P1, GDM and inconsistent pumping.  Feeding Mother's Current Feeding Choice: Breast Milk and Donor Milk Nipple Type: Nfant Extra Slow Flow (gold)  Lactation Tools Discussed/Used Tools: Pump;Flanges;Coconut oil Flange Size: 27 Breast pump type: Double-Electric Breast Pump Pump Education: Setup, frequency, and cleaning;Milk Storage Reason for Pumping: ETI in NICU Pumping frequency: 2 times/24 hours Pumped volume: 0 mL (still no drops per mom)  Discharge Discharge Education: Engorgement and breast care Pump: Personal;DEBP (Spectra S2)  Plan of care:   Encouraged mom to start pumping every 2-3 hours, at least 8 pumping sessions/24 hours. Breast massage, hand expression and coconut oil were also encouraged prior pumping   GOB (paternal granpa) present and supportive. All questions and concerns answered, mom to call NICU LC PRN.  Consult Status Consult Status: Follow-up Date: 09/13/21 Follow-up type: In-patient  Shannon Francis 09/13/2021, 11:55 AM

## 2021-09-13 NOTE — Discharge Summary (Signed)
Postpartum Discharge Summary  Date of Service updated 09/13/21     Patient Name: Shannon Francis DOB: 1991/04/02 MRN: [id number]  Date of admission: 09/07/2021 Delivery date:09/10/2021  Delivering provider: Everett Graff  Date of discharge: 09/13/2021  Admitting diagnosis: Gestational diabetes mellitus [O24.419] Intrauterine pregnancy: 37 weeks 5 days on admission   Secondary diagnosis:     Gestational diabetes mellitus A1   Maternal obesity affecting pregnancy, puerperium   Pregnancy affected by fetal growth restriction   HSV-2 infection   Gestational hypertension   Encounter for induction of labor   Status post primary low transverse cesarean section   Chorioamnionitis   PPH (postpartum hemorrhage)   Acute blood loss anemia  Additional problems: Antithrombin III deficiency    Discharge diagnosis: Term Pregnancy Delivered, Gestational Hypertension, GDM A1, and Anemia                                              Post partum procedures: None Augmentation: Pitocin and Cytotec Complications: HQIONGEXBM>8413KG and ROM>24 hours  Hospital course: Induction of Labor With Cesarean Section   30 y.o. yo G3P0020 at 51w4dwas admitted to the hospital 09/07/2021 for induction of labor for gestational hypertension, IUGR, A1GDM.  Patient had a prolonged labor course significant for protracted latent labor. On Day 3 of the Induction, there was evidence of chorioamnionitis and evidence of fetal distress, therefore the patient went for cesarean section due to Arrest of Dilation and Non-Reassuring FHR. Delivery details are as follows: EBL 1500 ML Membrane Rupture Time/Date:  ,  09/09/21 at 1300 Delivery Method:C-Section, Low Vertical  Details of operation can be found in separate operative Note.  Patient had an uncomplicated postpartum course. Procardia was added to her regimen to manage her hypertension. She is ambulating, tolerating a regular diet, passing flatus, and urinating well.  Patient  is discharged home in stable condition on 09/13/21.      Newborn Data: Birth date:09/10/2021  Birth time:4:14 PM  Gender:Female "Helaire" Living status:Living  Apgar scores:                         8 at 1 minute 7 at 5 minutes 9 at 10 minutes    Birth Weight 6 lb 4.5 oz (2850 g)                                Magnesium Sulfate received: No BMZ received: No Rhophylac:No MMR:No T-DaP:Given prenatally Flu: No Transfusion:No  Physical exam  Vitals:   09/12/21 0439 09/12/21 1445 09/12/21 2057 09/13/21 0438  BP: (!) 147/94 (!) 148/90 (!) 150/90 (!) 148/88  Pulse: (!) 110 96 (!) 101 98  Resp: _0 Temp: 98.1 F (36.7 C) 98.2 F (36.8 C) 98.4 F (36.9 C) 98.1 F (36.7 C)  TempSrc:  Oral Oral Oral  SpO2: 100% 95% 99% 99%  Weight:      Height:       General: alert, cooperative, and no distress Lochia: appropriate Uterine Fundus: firm Incision: Healing well with no significant drainage, No significant erythema, Dressing is clean, dry, and intact, wound vac in place DVT Evaluation: No evidence of DVT seen on physical exam. Negative Homan's sign. No cords or calf tenderness. Calf/Ankle edema is present Labs: Lab Results  Component Value Date   WBC 14.1 (H) 09/11/2021   HGB 9.8 (L) 09/11/2021   HCT 30.8 (L) 09/11/2021   MCV 84.8 09/11/2021   PLT 181 09/11/2021   CMP Latest Ref Rng & Units 09/11/2021  Glucose 70 - 99 mg/dL 88  BUN 6 - 20 mg/dL 9  Creatinine 0.44 - 1.00 mg/dL 0.92  Sodium 135 - 145 mmol/L 135  Potassium 3.5 - 5.1 mmol/L 4.1  Chloride 98 - 111 mmol/L 106  CO2 22 - 32 mmol/L 23  Calcium 8.9 - 10.3 mg/dL 8.2(L)  Total Protein 6.5 - 8.1 g/dL 4.8(L)  Total Bilirubin 0.3 - 1.2 mg/dL 1.5(H)  Alkaline Phos 38 - 126 U/L 91  AST 15 - 41 U/L 14(L)  ALT 0 - 44 U/L 14   Edinburgh Score: Edinburgh Postnatal Depression Scale Screening Tool 09/12/2021  I have been able to laugh and see the funny side of things. 0  I have looked forward with enjoyment to  things. 0  I have blamed myself unnecessarily when things went wrong. 2  I have been anxious or worried for no good reason. 2  I have felt scared or panicky for no good reason. 2  Things have been getting on top of me. 1  I have been so unhappy that I have had difficulty sleeping. 2  I have felt sad or miserable. 1  I have been so unhappy that I have been crying. 1  The thought of harming myself has occurred to me. 0  Edinburgh Postnatal Depression Scale Total 11      After visit meds:  Allergies as of 09/13/2021       Reactions   Hydrocodone Nausea And Vomiting, Other (See Comments)   dizziness        Medication List     STOP taking these medications    folic acid 1 MG tablet Commonly known as: FOLVITE       TAKE these medications    aspirin 81 MG chewable tablet Chew 81 mg by mouth daily.   cyclobenzaprine 10 MG tablet Commonly known as: FLEXERIL Take 1 tablet (10 mg total) by mouth 3 (three) times daily as needed for muscle spasms (every 8hours).   ibuprofen 600 MG tablet Commonly known as: ADVIL Take 1 tablet (600 mg total) by mouth every 6 (six) hours as needed for cramping.   NIFEdipine 60 MG 24 hr tablet Commonly known as: ADALAT CC Take 1 tablet (60 mg total) by mouth daily. Start taking on: September 14, 2021   oxycodone-acetaminophen 7.5-300 MG tablet Commonly known as: LYNOX Take 1 tablet by mouth every 4 (four) hours as needed for pain.   PRENATAL VITAMINS PO Take 1 tablet by mouth daily.   senna-docusate 8.6-50 MG tablet Commonly known as: Senokot-S Take 2 tablets by mouth 2 (two) times daily as needed for mild constipation.               Discharge Care Instructions  (From admission, onward)           Start     Ordered   09/13/21 0000  Discharge wound care:       Comments: Continue with the wound vac until removed by the physician   09/13/21 1116             Discharge home in stable condition Infant Feeding:  Bottle Infant Disposition:NICU Discharge instruction: per After Visit Summary and Postpartum booklet. Activity: Advance as tolerated. Pelvic rest for 6 weeks.  Diet: routine diet Anticipated Birth Control: Unsure Postpartum Appointment:4 weeks Additional Postpartum F/U: BP check 1 week  Future Appointments: Dr. Alwyn Pea September 20, 2021 at 10:00 am Follow up Visit:      09/13/2021 Sanjuana Kava, MD

## 2021-09-13 NOTE — Clinical Social Work Maternal (Signed)
CLINICAL SOCIAL WORK MATERNAL/CHILD NOTE  Patient Details  Name: Shannon Francis MRN: [id number] Date of Birth: 02/17/1991  Date:  09/13/2021  Clinical Social Worker Initiating Note:  Ayris Carano, LCSW Date/Time: Initiated:  09/13/21/1427     Child's Name:  Shannon Francis   Biological Parents:  Mother, Father (Father: Christopher Valdivia)   Need for Interpreter:  None   Reason for Referral:  Behavioral Health Concerns, Other (Comment) (Edinburgh 11; Infant's NICU admission)   Address:  [address] [place] [address]    Phone number:  [phone] (home)     Additional phone number:   Household Members/Support Persons (HM/SP):   Household Member/Support Person 1   HM/SP Name Relationship DOB or Age  HM/SP -1 Christopher Greth Husband/FOB    HM/SP -2        HM/SP -3        HM/SP -4        HM/SP -5        HM/SP -6        HM/SP -7        HM/SP -8          Natural Supports (not living in the home):  Parent   Professional Supports: None   Employment: Full-time   Type of Work: social portfolio manager   Education:  College graduate   Homebound arranged:    Financial Resources:  Private Insurance    Other Resources:      Cultural/Religious Considerations Which May Impact Care:    Strengths:  Ability to meet basic needs  , Pediatrician chosen, Home prepared for child  , Understanding of illness   Psychotropic Medications:         Pediatrician:    [place] area  Pediatrician List:   [facility]  [place]    [place]    [place]    [place]    [place]      Pediatrician Fax Number:    Risk Factors/Current Problems:  None   Cognitive State:  Able to Concentrate  , Alert  , Goal Oriented  , Insightful  , Linear Thinking     Mood/Affect:  Calm  , Interested  , Comfortable     CSW Assessment: CSW met with MOB at bedside to complete psychosocial assessment, MOB's father  present. CSW introduced self and explained role. CSW asked MOB's father to leave the room to speak with MOB privately, MOB's father left the room. MOB was welcoming, pleasant, and remained engaged during assessment. MOB reported that she resides with her Husband/FOB and works as a social portfolio manager. MOB reported that she has all items needed to care for infant including a car seat and crib. '  CSW inquired about MOB's mental health history. MOB denied any mental health history. CSW and MOB discussed edinburgh score 11. MOB shared that the past week has been a roller coaster but it has got better since she was able to hold infant yesterday. CSW inquired about how MOB was feeling emotionally since giving birth, MOB reported that she was feeling pretty good. MOB shared that she was feeling down in the beginning due to having an unplanned c-section. CSW acknowledged and validated MOB's feelings/experience. MOB reported that she is feeling better now. MOB presented calm and did not demonstrate any acute mental health signs/symptoms. CSW assessed for safety, MOB denied SI, HI, and domestic violence.   CSW provided education regarding the baby blues period   vs. perinatal mood disorders, discussed treatment and gave resources for mental health follow up if concerns arise.  CSW recommends self-evaluation during the postpartum time period using the New Mom Checklist from Postpartum Progress and encouraged MOB to contact a medical professional if symptoms are noted at any time.    CSW provided review of Sudden Infant Death Syndrome (SIDS) precautions.  CSW and MOB discussed infant's NICU admission. CSW informed MOB about the NICU, what to expect, and resources/supports available while infant is admitted to the NICU. MOB reported that she feels well informed about infant's care. MOB reported that meal vouchers would be helpful, CSW agreed to place meal vouchers at infant's bedside. MOB denied any transportation  barriers with visiting infant in the NICU. MOB denied any questions/concerns regarding the NICU.     CSW will continue to offer resources/supports while infant is admitted to the NICU.    CSW Plan/Description:  Psychosocial Support and Ongoing Assessment of Needs, Sudden Infant Death Syndrome (SIDS) Education, Perinatal Mood and Anxiety Disorder (PMADs) Education, Other Patient/Family Education    Greydon Betke L Bee Hammerschmidt, LCSW 09/13/2021, 2:30 PM 

## 2021-09-14 ENCOUNTER — Inpatient Hospital Stay (HOSPITAL_COMMUNITY): Payer: Federal, State, Local not specified - PPO

## 2021-09-17 ENCOUNTER — Other Ambulatory Visit: Payer: Self-pay

## 2021-09-17 ENCOUNTER — Inpatient Hospital Stay (HOSPITAL_COMMUNITY)
Admission: AD | Admit: 2021-09-17 | Discharge: 2021-09-18 | Disposition: A | Discharge status: Home / Self Care | Payer: Federal, State, Local not specified - PPO | Source: Home / Self Care | Attending: Obstetrics and Gynecology | Admitting: Obstetrics and Gynecology

## 2021-09-17 DIAGNOSIS — O99893 Other specified diseases and conditions complicating puerperium: Secondary | ICD-10-CM | POA: Insufficient documentation

## 2021-09-17 DIAGNOSIS — O139 Gestational [pregnancy-induced] hypertension without significant proteinuria, unspecified trimester: Secondary | ICD-10-CM

## 2021-09-17 DIAGNOSIS — R519 Headache, unspecified: Secondary | ICD-10-CM

## 2021-09-17 HISTORY — DX: Morbid (severe) obesity due to excess calories: E66.01

## 2021-09-18 ENCOUNTER — Encounter (HOSPITAL_COMMUNITY): Payer: Self-pay | Admitting: *Deleted

## 2021-09-18 DIAGNOSIS — R030 Elevated blood-pressure reading, without diagnosis of hypertension: Secondary | ICD-10-CM | POA: Diagnosis not present

## 2021-09-18 DIAGNOSIS — O1415 Severe pre-eclampsia, complicating the puerperium: Secondary | ICD-10-CM | POA: Diagnosis not present

## 2021-09-18 LAB — COMPREHENSIVE METABOLIC PANEL
ALT: 27 U/L (ref 0–44)
AST: 26 U/L (ref 15–41)
Albumin: 2.6 g/dL — ABNORMAL LOW (ref 3.5–5.0)
Alkaline Phosphatase: 105 U/L (ref 38–126)
Anion gap: 9 (ref 5–15)
BUN: 9 mg/dL (ref 6–20)
CO2: 25 mmol/L (ref 22–32)
Calcium: 8.8 mg/dL — ABNORMAL LOW (ref 8.9–10.3)
Chloride: 103 mmol/L (ref 98–111)
Creatinine, Ser: 0.75 mg/dL (ref 0.44–1.00)
GFR, Estimated: >60 mL/min (ref >60)
Glucose, Bld: 94 mg/dL (ref 70–99)
Potassium: 3.9 mmol/L (ref 3.5–5.1)
Sodium: 137 mmol/L (ref 135–145)
Total Bilirubin: 0.5 mg/dL (ref 0.3–1.2)
Total Protein: 6.6 g/dL (ref 6.5–8.1)

## 2021-09-18 LAB — PROTEIN / CREATININE RATIO, URINE
Creatinine, Urine: 22.47 mg/dL
Total Protein, Urine: <6 mg/dL

## 2021-09-18 LAB — CBC WITH DIFFERENTIAL/PLATELET
Abs Immature Granulocytes: 0.08 10*3/uL — ABNORMAL HIGH (ref 0.00–0.07)
Basophils Absolute: 0 10*3/uL (ref 0.0–0.1)
Basophils Relative: 0 %
Eosinophils Absolute: 0.4 10*3/uL (ref 0.0–0.5)
Eosinophils Relative: 4 %
HCT: 33.7 % — ABNORMAL LOW (ref 36.0–46.0)
Hemoglobin: 10.9 g/dL — ABNORMAL LOW (ref 12.0–15.0)
Immature Granulocytes: 1 %
Lymphocytes Relative: 22 %
Lymphs Abs: 2.3 10*3/uL (ref 0.7–4.0)
MCH: 26.8 pg (ref 26.0–34.0)
MCHC: 32.3 g/dL (ref 30.0–36.0)
MCV: 82.8 fL (ref 80.0–100.0)
Monocytes Absolute: 0.8 10*3/uL (ref 0.1–1.0)
Monocytes Relative: 7 %
Neutro Abs: 6.8 10*3/uL (ref 1.7–7.7)
Neutrophils Relative %: 66 %
Platelets: 373 10*3/uL (ref 150–400)
RBC: 4.07 MIL/uL (ref 3.87–5.11)
RDW: 15 % (ref 11.5–15.5)
WBC: 10.4 10*3/uL (ref 4.0–10.5)
nRBC: 0 % (ref 0.0–0.2)

## 2021-09-18 MED ORDER — DIPHENHYDRAMINE HCL 50 MG/ML IJ SOLN: 25.0000 mg | Freq: Once | INTRAMUSCULAR | Status: DC

## 2021-09-18 MED ORDER — KETOROLAC TROMETHAMINE 30 MG/ML IJ SOLN
30.0000 mg | Freq: Once | INTRAMUSCULAR | Status: AC
  Administered 2021-09-18: 30 mg via INTRAVENOUS

## 2021-09-18 MED ORDER — KETOROLAC TROMETHAMINE 30 MG/ML IJ SOLN
30.0000 mg | Freq: Once | INTRAMUSCULAR | Status: DC
  Filled 2021-09-18: qty 1

## 2021-09-18 MED ORDER — LACTATED RINGERS IV SOLN
INTRAVENOUS | Status: DC
  Administered 2021-09-18: 1000 mL via INTRAVENOUS

## 2021-09-18 MED ORDER — SODIUM CHLORIDE 0.9 % IV SOLN
12.5000 mg | Freq: Once | INTRAVENOUS | Status: DC
  Filled 2021-09-18: qty 0.5

## 2021-09-18 MED ORDER — DIPHENHYDRAMINE HCL 50 MG/ML IJ SOLN
25.0000 mg | Freq: Once | INTRAMUSCULAR | Status: DC
  Administered 2021-09-18: 25 mg via INTRAVENOUS
  Filled 2021-09-18: qty 1

## 2021-09-18 MED ORDER — PROMETHAZINE HCL 25 MG/ML IJ SOLN
12.5000 mg | Freq: Once | INTRAMUSCULAR | Status: DC
  Filled 2021-09-18: qty 1

## 2021-09-18 MED ORDER — SODIUM CHLORIDE 0.9 % IV SOLN
12.5000 mg | Freq: Once | INTRAVENOUS | Status: DC
  Administered 2021-09-18: 12.5 mg via INTRAVENOUS
  Filled 2021-09-18: qty 12.5

## 2021-09-18 MED ORDER — KETOROLAC TROMETHAMINE 30 MG/ML IJ SOLN: 30.0000 mg | Freq: Once | INTRAMUSCULAR | Status: DC

## 2021-09-18 MED ORDER — LACTATED RINGERS IV BOLUS: 1000.0000 mL | Freq: Once | INTRAVENOUS | Status: DC

## 2021-09-18 NOTE — MAU Note (Signed)
Zorita Pang called with BP's and pt report the headache is resolved and she has no other CNS symptoms.

## 2021-09-18 NOTE — MAU Note (Signed)
BP had been removed for IV team to start IV-cuff reapllied

## 2021-09-18 NOTE — MAU Note (Signed)
PP c-section 09/10/21.C/o severe headache since 1930. B/P elevated at 153/98.  C/O ear ache as well with headache.

## 2021-09-18 NOTE — MAU Provider Note (Signed)
History     CSN: [id number]  Arrival date and time: 09/17/21 2357   Event Date/Time   First Provider Initiated Contact with Patient 09/18/21 0115      Chief Complaint  Patient presents with   Headache   Hypertension   Shannon Francis is a 30 y.o. G4P2011 who is 9 day PP S/P LTCS on 09/10/2021. She reports that around 1900 today she started having a headache. This occurred soon after taking her evening meds of oxycodone, tylenol and stool softner. She has not taken anything else since taking those medications.   Headache  This is a new problem. The current episode started today. The problem has been unchanged. The pain is located in the Frontal and right unilateral region. Radiates to: right ear. The pain is at a severity of 8/10. Associated symptoms include ear pain. The symptoms are aggravated by bright light. She has tried oral narcotics and acetaminophen for the symptoms. The treatment provided no relief. Her past medical history is significant for hypertension.  Hypertension This is a new problem. The current episode started 1 to 4 weeks ago. The problem is unchanged. Associated symptoms include headaches. Associated agents: pregnancy. There are no compliance problems.    OB History     Gravida  4   Para  2   Term  2   Preterm  0   AB  1   Living  1      SAB  0   IAB  1   Ectopic  0   Multiple  0   Live Births  1           Past Medical History:  Diagnosis Date   Headache    Morbid obesity (HCC)     Past Surgical History:  Procedure Laterality Date   ANTERIOR CRUCIATE LIGAMENT REPAIR     left   CESAREAN SECTION N/A 09/10/2021   Procedure: CESAREAN SECTION;  Surgeon: Osborn Coho, MD;  Location: [facility] LD ORS;  Service: Obstetrics;  Laterality: N/A;   DILATATION & CURETTAGE/HYSTEROSCOPY WITH MYOSURE N/A 08/13/2018   Procedure: DILATATION & CURETTAGE/HYSTEROSCOPY WITH MYOSURE;  Surgeon: Olivia Mackie, MD;  Location: [facility] ORS;  Service: Gynecology;   Laterality: N/A;   INCISION AND DRAINAGE     chest   KNEE SURGERY      Family History  Problem Relation Age of Onset   Hypertension Mother    Diabetes Mother    Stroke Mother    Cancer Sister    Diabetes Other    Healthy Father     Social History   Tobacco Use   Smoking status: Never   Smokeless tobacco: Never  Vaping Use   Vaping Use: Never used  Substance Use Topics   Alcohol use: No   Drug use: No    Allergies:  Allergies  Allergen Reactions   Hydrocodone Nausea And Vomiting and Other (See Comments)    dizziness    Medications Prior to Admission  Medication Sig Dispense Refill Last Dose   acetaminophen (TYLENOL) 325 MG tablet Take 650 mg by mouth every 6 (six) hours as needed.   09/17/2021 at 1900   aspirin 81 MG chewable tablet Chew 81 mg by mouth daily.   09/17/2021 at 1900   NIFEdipine (ADALAT CC) 60 MG 24 hr tablet Take 1 tablet (60 mg total) by mouth daily. 60 tablet 3 09/17/2021 at 1900   oxyCODONE-acetaminophen (PERCOCET) 7.5-325 MG tablet Take 1 tablet by mouth every 4 (four) hours as  needed for severe pain. 30 tablet 0 09/17/2021 at 1900   Prenatal Vit-Fe Fumarate-FA (PRENATAL VITAMINS PO) Take 1 tablet by mouth daily.   09/17/2021 at 1900   senna-docusate (SENOKOT-S) 8.6-50 MG tablet Take 2 tablets by mouth 2 (two) times daily as needed for mild constipation. 60 tablet 3 09/17/2021 at 1900   cyclobenzaprine (FLEXERIL) 10 MG tablet Take 1 tablet (10 mg total) by mouth 3 (three) times daily as needed for muscle spasms (every 8hours). 30 tablet 3    ibuprofen (ADVIL) 600 MG tablet Take 1 tablet (600 mg total) by mouth every 6 (six) hours as needed for cramping. 60 tablet 3    oxycodone-acetaminophen (LYNOX) 7.5-300 MG tablet Take 1 tablet by mouth every 4 (four) hours as needed for pain. 30 tablet 0     Review of Systems  HENT:  Positive for ear pain.   Neurological:  Positive for headaches.  All other systems reviewed and are negative. Physical Exam   Blood  pressure (!) 152/87, pulse 92, temperature 98.2 F (36.8 C), temperature source Oral, resp. rate 18, SpO2 92 %, unknown if currently breastfeeding.  Physical Exam Vitals and nursing note reviewed.  Constitutional:      General: She is not in acute distress. HENT:     Head: Normocephalic.  Eyes:     Pupils: Pupils are equal, round, and reactive to light.  Cardiovascular:     Rate and Rhythm: Normal rate.  Pulmonary:     Effort: Pulmonary effort is normal.  Abdominal:     Palpations: Abdomen is soft.     Tenderness: There is no abdominal tenderness.  Musculoskeletal:     Right lower leg: No edema.     Left lower leg: No edema.  Skin:    General: Skin is warm and dry.  Neurological:     Mental Status: She is alert and oriented to person, place, and time.     Deep Tendon Reflexes: Reflexes normal.     Comments: No clonus   Psychiatric:        Mood and Affect: Mood normal.        Behavior: Behavior normal.   Results for orders placed or performed during the hospital encounter of 09/17/21 (from the past 24 hour(s))  CBC with Differential/Platelet     Status: Abnormal   Collection Time: 09/18/21 12:47 AM  Result Value Ref Range   WBC 10.4 4.0 - 10.5 K/uL   RBC 4.07 3.87 - 5.11 MIL/uL   Hemoglobin 10.9 (L) 12.0 - 15.0 g/dL   HCT 62.7 (L) 03.5 - 00.9 %   MCV 82.8 80.0 - 100.0 fL   MCH 26.8 26.0 - 34.0 pg   MCHC 32.3 30.0 - 36.0 g/dL   RDW 38.1 82.9 - 93.7 %   Platelets 373 150 - 400 K/uL   nRBC 0.0 0.0 - 0.2 %   Neutrophils Relative % 66 %   Neutro Abs 6.8 1.7 - 7.7 K/uL   Lymphocytes Relative 22 %   Lymphs Abs 2.3 0.7 - 4.0 K/uL   Monocytes Relative 7 %   Monocytes Absolute 0.8 0.1 - 1.0 K/uL   Eosinophils Relative 4 %   Eosinophils Absolute 0.4 0.0 - 0.5 K/uL   Basophils Relative 0 %   Basophils Absolute 0.0 0.0 - 0.1 K/uL   Immature Granulocytes 1 %   Abs Immature Granulocytes 0.08 (H) 0.00 - 0.07 K/uL  Comprehensive metabolic panel     Status: Abnormal    Collection  Time: 09/18/21 12:47 AM  Result Value Ref Range   Sodium 137 135 - 145 mmol/L   Potassium 3.9 3.5 - 5.1 mmol/L   Chloride 103 98 - 111 mmol/L   CO2 25 22 - 32 mmol/L   Glucose, Bld 94 70 - 99 mg/dL   BUN 9 6 - 20 mg/dL   Creatinine, Ser 4.69 0.44 - 1.00 mg/dL   Calcium 8.8 (L) 8.9 - 10.3 mg/dL   Total Protein 6.6 6.5 - 8.1 g/dL   Albumin 2.6 (L) 3.5 - 5.0 g/dL   AST 26 15 - 41 U/L   ALT 27 0 - 44 U/L   Alkaline Phosphatase 105 38 - 126 U/L   Total Bilirubin 0.5 0.3 - 1.2 mg/dL   GFR, Estimated >62 >95 mL/min   Anion gap 9 5 - 15  Protein / creatinine ratio, urine     Status: None   Collection Time: 09/18/21  3:16 AM  Result Value Ref Range   Creatinine, Urine 22.47 mg/dL   Total Protein, Urine <6 mg/dL   Protein Creatinine Ratio        0.00 - 0.15 mg/mg[Cre]    MAU Course  Procedures  MDM There was a significant delay in getting IV started due to difficulty with stick. IV team called to assist.   5:48 AM Patient getting toradol, phenergan and benadryl IV for headache. Patient reports headache is now 0/10.  BP has been within an acceptable range for known GHTN and labs are normal. Patient has FU BP check in the office on 09/20/2021 per DC summary.    Assessment and Plan   1. Acute nonintractable headache, unspecified headache type   2. Gestational hypertension, antepartum   3. Postpartum state    DC home in stable condition  Comfort measures reviewed  PP precautions reviewed  RX: none  Return to MAU as needed FU with OB as planned on 09/20/2021.    Follow-up Information     Essie Hart, MD Follow up.   Specialty: Obstetrics and Gynecology Why: As scheduled Contact information: [address] [place] [address] [place] [address] [phone]                 Thressa Sheller DNP, CNM  09/18/21  5:48 AM

## 2021-09-18 NOTE — MAU Note (Signed)
Pt up to bathroom to urinate-states headache has improved but has not resolved-states she feels it mostly when she turns her head to the left and right.

## 2021-09-19 ENCOUNTER — Ambulatory Visit: Payer: Self-pay

## 2021-09-19 NOTE — Lactation Note (Signed)
This note was copied from a baby's chart. Lactation Consultation Note Mother is currently pumping and bottle feeding. She would like to attempt bf'ing. LC will plan f/u visit tomorrow to assist.   Patient Name: Shannon Francis Date: 09/19/2021 Reason for consult: Follow-up assessment Age:30 days  Maternal Data  Pumping frequency: 3-4xday Pumped volume: 90-180 mL L nipple trauma- provided hydrogel  Feeding Nipple Type: Dr. Levert Feinstein Preemie   Interventions Interventions: Infant Driven Feeding Algorithm education;Education;Breast feeding basics reviewed  Consult Status Consult Status: Follow-up Date: 09/19/21 Follow-up type: In-patient   Elder Negus 09/19/2021, 4:05 PM

## 2021-09-20 ENCOUNTER — Other Ambulatory Visit: Payer: Self-pay

## 2021-09-20 ENCOUNTER — Encounter (HOSPITAL_COMMUNITY): Payer: Self-pay | Admitting: Obstetrics & Gynecology

## 2021-09-20 ENCOUNTER — Inpatient Hospital Stay (HOSPITAL_COMMUNITY): Payer: Federal, State, Local not specified - PPO

## 2021-09-20 ENCOUNTER — Inpatient Hospital Stay (HOSPITAL_COMMUNITY)
Admission: AD | Admit: 2021-09-20 | Discharge: 2021-09-22 | DRG: 776 | Disposition: A | Discharge status: Home / Self Care | Payer: Federal, State, Local not specified - PPO | Attending: Obstetrics & Gynecology | Admitting: Obstetrics & Gynecology

## 2021-09-20 DIAGNOSIS — O900 Disruption of cesarean delivery wound: Secondary | ICD-10-CM | POA: Diagnosis present

## 2021-09-20 DIAGNOSIS — Z20822 Contact with and (suspected) exposure to covid-19: Secondary | ICD-10-CM | POA: Diagnosis present

## 2021-09-20 DIAGNOSIS — O8600 Infection of obstetric surgical wound, unspecified: Secondary | ICD-10-CM | POA: Diagnosis present

## 2021-09-20 DIAGNOSIS — O99215 Obesity complicating the puerperium: Secondary | ICD-10-CM | POA: Diagnosis present

## 2021-09-20 DIAGNOSIS — O1415 Severe pre-eclampsia, complicating the puerperium: Principal | ICD-10-CM | POA: Diagnosis present

## 2021-09-20 DIAGNOSIS — R030 Elevated blood-pressure reading, without diagnosis of hypertension: Secondary | ICD-10-CM | POA: Diagnosis present

## 2021-09-20 DIAGNOSIS — O1495 Unspecified pre-eclampsia, complicating the puerperium: Secondary | ICD-10-CM | POA: Diagnosis present

## 2021-09-20 LAB — COMPREHENSIVE METABOLIC PANEL
ALT: 26 U/L (ref 0–44)
AST: 20 U/L (ref 15–41)
Albumin: 2.6 g/dL — ABNORMAL LOW (ref 3.5–5.0)
Alkaline Phosphatase: 105 U/L (ref 38–126)
Anion gap: 8 (ref 5–15)
BUN: 13 mg/dL (ref 6–20)
CO2: 27 mmol/L (ref 22–32)
Calcium: 8.7 mg/dL — ABNORMAL LOW (ref 8.9–10.3)
Chloride: 105 mmol/L (ref 98–111)
Creatinine, Ser: 0.93 mg/dL (ref 0.44–1.00)
GFR, Estimated: >60 mL/min (ref >60)
Glucose, Bld: 91 mg/dL (ref 70–99)
Potassium: 4 mmol/L (ref 3.5–5.1)
Sodium: 140 mmol/L (ref 135–145)
Total Bilirubin: 0.3 mg/dL (ref 0.3–1.2)
Total Protein: 6.3 g/dL — ABNORMAL LOW (ref 6.5–8.1)

## 2021-09-20 LAB — CBC
HCT: 35.4 % — ABNORMAL LOW (ref 36.0–46.0)
Hemoglobin: 11.1 g/dL — ABNORMAL LOW (ref 12.0–15.0)
MCH: 26.2 pg (ref 26.0–34.0)
MCHC: 31.4 g/dL (ref 30.0–36.0)
MCV: 83.5 fL (ref 80.0–100.0)
Platelets: 396 10*3/uL (ref 150–400)
RBC: 4.24 MIL/uL (ref 3.87–5.11)
RDW: 15.2 % (ref 11.5–15.5)
WBC: 7.3 10*3/uL (ref 4.0–10.5)
nRBC: 0 % (ref 0.0–0.2)

## 2021-09-20 LAB — TYPE AND SCREEN
ABO/RH(D): B POS
Antibody Screen: NEGATIVE

## 2021-09-20 LAB — RESP PANEL BY RT-PCR (FLU A&B, COVID) ARPGX2
Influenza A by PCR: NEGATIVE
Influenza B by PCR: NEGATIVE
SARS Coronavirus 2 by RT PCR: NEGATIVE

## 2021-09-20 MED ORDER — MAGNESIUM SULFATE BOLUS VIA INFUSION: 6.0000 g | Freq: Once | INTRAVENOUS | Status: DC

## 2021-09-20 MED ORDER — IOHEXOL 9 MG/ML PO SOLN
500.0000 mL | ORAL | Status: AC
  Administered 2021-09-20: 500 mL via ORAL

## 2021-09-20 MED ORDER — COCONUT OIL OIL
1.0000 "application " | TOPICAL_OIL | Status: DC | PRN
  Administered 2021-09-20: 1 via TOPICAL

## 2021-09-20 MED ORDER — LABETALOL HCL 5 MG/ML IV SOLN: 40.0000 mg | INTRAVENOUS | Status: DC | PRN

## 2021-09-20 MED ORDER — LABETALOL HCL 5 MG/ML IV SOLN
20.0000 mg | INTRAVENOUS | Status: DC | PRN
  Administered 2021-09-20: 20 mg via INTRAVENOUS
  Filled 2021-09-20: qty 4

## 2021-09-20 MED ORDER — LABETALOL HCL 5 MG/ML IV SOLN: 80.0000 mg | INTRAVENOUS | Status: DC | PRN

## 2021-09-20 MED ORDER — ENOXAPARIN SODIUM 40 MG/0.4ML IJ SOSY
40.0000 mg | PREFILLED_SYRINGE | Freq: Two times a day (BID) | INTRAMUSCULAR | Status: DC
  Administered 2021-09-20 – 2021-09-22 (×4): 40 mg via SUBCUTANEOUS
  Filled 2021-09-20 (×4): qty 0.4

## 2021-09-20 MED ORDER — LABETALOL HCL 200 MG PO TABS
200.0000 mg | ORAL_TABLET | Freq: Two times a day (BID) | ORAL | Status: DC
  Administered 2021-09-20: 200 mg via ORAL
  Filled 2021-09-20: qty 1

## 2021-09-20 MED ORDER — LABETALOL HCL 5 MG/ML IV SOLN: 20.0000 mg | INTRAVENOUS | Status: DC | PRN

## 2021-09-20 MED ORDER — CEFAZOLIN SODIUM-DEXTROSE 2-4 GM/100ML-% IV SOLN: 2.0000 g | Freq: Three times a day (TID) | INTRAVENOUS | Status: DC

## 2021-09-20 MED ORDER — NIFEDIPINE ER OSMOTIC RELEASE 30 MG PO TB24
60.0000 mg | ORAL_TABLET | Freq: Once | ORAL | Status: AC
  Administered 2021-09-20: 60 mg via ORAL
  Filled 2021-09-20: qty 2

## 2021-09-20 MED ORDER — IOHEXOL 350 MG/ML SOLN
100.0000 mL | Freq: Once | INTRAVENOUS | Status: AC | PRN
  Administered 2021-09-20: 100 mL via INTRAVENOUS

## 2021-09-20 MED ORDER — OXYCODONE-ACETAMINOPHEN 5-325 MG PO TABS: 1.0000 | ORAL_TABLET | ORAL | Status: DC | PRN

## 2021-09-20 MED ORDER — LACTATED RINGERS IV SOLN: INTRAVENOUS | Status: DC

## 2021-09-20 MED ORDER — HYDRALAZINE HCL 20 MG/ML IJ SOLN: 10.0000 mg | INTRAMUSCULAR | Status: DC | PRN

## 2021-09-20 MED ORDER — LABETALOL HCL 200 MG PO TABS
200.0000 mg | ORAL_TABLET | Freq: Two times a day (BID) | ORAL | Status: DC
  Administered 2021-09-21 – 2021-09-22 (×4): 200 mg via ORAL
  Filled 2021-09-20 (×4): qty 1

## 2021-09-20 MED ORDER — IOHEXOL 9 MG/ML PO SOLN
ORAL | Status: AC
  Administered 2021-09-20: 500 mL via ORAL
  Filled 2021-09-20: qty 1000

## 2021-09-20 MED ORDER — MAGNESIUM SULFATE 40 GM/1000ML IV SOLN
INTRAVENOUS | Status: AC
  Administered 2021-09-20: 4 g via INTRAVENOUS
  Filled 2021-09-20: qty 1000

## 2021-09-20 MED ORDER — PIPERACILLIN-TAZOBACTAM 3.375 G IVPB
3.3750 g | Freq: Three times a day (TID) | INTRAVENOUS | Status: DC
  Administered 2021-09-20 – 2021-09-22 (×5): 3.375 g via INTRAVENOUS
  Filled 2021-09-20 (×9): qty 50

## 2021-09-20 MED ORDER — IBUPROFEN 600 MG PO TABS: 600.0000 mg | ORAL_TABLET | Freq: Four times a day (QID) | ORAL | Status: DC | PRN

## 2021-09-20 MED ORDER — MAGNESIUM SULFATE 40 GM/1000ML IV SOLN
2.0000 g/h | INTRAVENOUS | Status: AC
  Administered 2021-09-21: 2 g/h via INTRAVENOUS
  Filled 2021-09-20: qty 1000

## 2021-09-20 MED ORDER — ENOXAPARIN SODIUM 40 MG/0.4ML IJ SOSY: 40.0000 mg | PREFILLED_SYRINGE | INTRAMUSCULAR | Status: DC

## 2021-09-20 MED ORDER — MAGNESIUM SULFATE 40 GM/1000ML IV SOLN: 2.0000 g/h | INTRAVENOUS | Status: DC

## 2021-09-20 MED ORDER — MAGNESIUM SULFATE BOLUS VIA INFUSION
4.0000 g | Freq: Once | INTRAVENOUS | Status: DC
  Filled 2021-09-20: qty 1000

## 2021-09-20 NOTE — MAU Note (Signed)
Contrast to be completed at 1115 and 1215.  CT to transport patient at 1315.

## 2021-09-20 NOTE — Lactation Note (Signed)
This note was copied from a baby's chart. Lactation Consultation Note  Patient Name: Shannon Francis ENIDP'O Date: 09/20/2021 Reason for consult: Follow-up assessment;Primapara;1st time breastfeeding;NICU baby;Other (Comment);Early term 25-38.6wks;Maternal endocrine disorder (OB Specialty care readmit) Age:30 years  Visited with mom of 30 days old ETI NICU female, she's a P1 and got readmitted today due to hypertension. Mom voiced FOB will be bringing her pump in the room, RN broght in cleaning supplies and coconut oil.  Mom is not in condition to take baby to breast at this point, but she voiced she'll be working on pumping. Breast feel slightly hardened upon examination, they're filling.  Mom complained about her left nipple being sore, noticed some blanched skin probably due to inadequate suction; mom unsure which pump caused this but she'll check and review the settings.  Reviewed pumping schedule, expectations, lactogenesis III and supply/demand.  Plan of care:   Encouraged mom to start pumping every 2-3 hours, at least 8 pumping sessions/24 hours. Breast massage, hand expression and coconut oil were also encouraged prior pumping Mom to call NICU LC when she's ready to start taking baby to breast   FOB present. All questions and concerns answered, mom to call NICU LC PRN.  Maternal Data   Mom' supply is still WNL overall in a 24 hours period but she's not pumping consistently since she got readmitted  Feeding Mother's Current Feeding Choice: Breast Milk and Formula Nipple Type: Dr. Cline Crock  Lactation Tools Discussed/Used Tools: Pump;Flanges;Coconut oil Flange Size: 27 Breast pump type: Double-Electric Breast Pump Pump Education: Setup, frequency, and cleaning;Milk Storage Reason for Pumping: ETI in NICU Pumping frequency: 3-4 times/24 hours Pumped volume: 180 mL  Discharge Discharge Education: Engorgement and breast care Pump: DEBP;Personal (Spectra  S2)  Consult Status Consult Status: Follow-up Date: 09/20/21 Follow-up type: In-patient   Chawn Spraggins Venetia Constable 09/20/2021, 4:30 PM

## 2021-09-20 NOTE — MAU Provider Note (Signed)
History     CSN: [id number]  Arrival date and time: 09/20/21 0934   Event Date/Time   First Provider Initiated Contact with Patient 09/20/21 1001      Chief Complaint  Patient presents with   Hypertension   Drainage from Incision   HPI Shannon Francis is a 30 y.o. [id number] patient. She is s/p cesarean section on 09/10/2021 for FITL and "presumptive chorioamnionitis". Patient presents to MAU from [facility] office for evaluation of blood pressure and abnormal discharge from her wound vac.  Patient states her blood pressure was elevated during her visit in office this morning. She has not yet taken her daily Procardia 60 XL. She denies headache, visual disturbances, RUQ/epigastric pain, new onset swelling or weight gain.  Patient receives care with [facility]. Her baby is in the NICU.   OB History     Gravida  4   Para  1   Term  1   Preterm  0   AB  3   Living  1      SAB  1   IAB  2   Ectopic  0   Multiple  0   Live Births  1           Past Medical History:  Diagnosis Date   Headache    Morbid obesity (HCC)     Past Surgical History:  Procedure Laterality Date   ANTERIOR CRUCIATE LIGAMENT REPAIR     left   CESAREAN SECTION N/A 09/10/2021   Procedure: CESAREAN SECTION;  Surgeon: Osborn Coho, MD;  Location: [facility];  Service: Obstetrics;  Laterality: N/A;   DILATATION & CURETTAGE/HYSTEROSCOPY WITH MYOSURE N/A 08/13/2018   Procedure: DILATATION & CURETTAGE/HYSTEROSCOPY WITH MYOSURE;  Surgeon: Olivia Mackie, MD;  Location: [facility];  Service: Gynecology;  Laterality: N/A;   INCISION AND DRAINAGE     chest   KNEE SURGERY      Family History  Problem Relation Age of Onset   Hypertension Mother    Diabetes Mother    Stroke Mother    Cancer Sister    Diabetes Other    Healthy Father     Social History   Tobacco Use   Smoking status: Never   Smokeless tobacco: Never  Vaping Use   Vaping Use: Never used  Substance Use Topics   Alcohol use: No    Drug use: No    Allergies:  Allergies  Allergen Reactions   Hydrocodone Nausea And Vomiting and Other (See Comments)    dizziness    Medications Prior to Admission  Medication Sig Dispense Refill Last Dose   aspirin 81 MG chewable tablet Chew 81 mg by mouth daily.   09/19/2021   aspirin-acetaminophen-caffeine (EXCEDRIN MIGRAINE) 250-250-65 MG tablet Take by mouth every 6 (six) hours as needed for headache.   09/19/2021   NIFEdipine (ADALAT CC) 60 MG 24 hr tablet Take 1 tablet (60 mg total) by mouth daily. 60 tablet 3 09/19/2021   Prenatal Vit-Fe Fumarate-FA (PRENATAL VITAMINS PO) Take 1 tablet by mouth daily.   09/19/2021   acetaminophen (TYLENOL) 325 MG tablet Take 650 mg by mouth every 6 (six) hours as needed.      cyclobenzaprine (FLEXERIL) 10 MG tablet Take 1 tablet (10 mg total) by mouth 3 (three) times daily as needed for muscle spasms (every 8hours). 30 tablet 3    ibuprofen (ADVIL) 600 MG tablet Take 1 tablet (600 mg total) by mouth every 6 (six) hours as needed for cramping.  60 tablet 3    oxycodone-acetaminophen (LYNOX) 7.5-300 MG tablet Take 1 tablet by mouth every 4 (four) hours as needed for pain. 30 tablet 0    oxyCODONE-acetaminophen (PERCOCET) 7.5-325 MG tablet Take 1 tablet by mouth every 4 (four) hours as needed for severe pain. 30 tablet 0    senna-docusate (SENOKOT-S) 8.6-50 MG tablet Take 2 tablets by mouth 2 (two) times daily as needed for mild constipation. 60 tablet 3     Review of Systems  Eyes:  Negative for visual disturbance.  Gastrointestinal:  Negative for abdominal pain.  Genitourinary:  Negative for vaginal bleeding.  Neurological:  Negative for headaches.  All other systems reviewed and are negative. Physical Exam   Blood pressure (!) 146/85, pulse 97, resp. rate 19, SpO2 96 %, currently breastfeeding.  Physical Exam Vitals and nursing note reviewed. Exam conducted with a chaperone present.  Constitutional:      Appearance: Normal appearance.  She is obese.  Cardiovascular:     Rate and Rhythm: Normal rate.     Pulses: Normal pulses.     Heart sounds: Normal heart sounds.  Pulmonary:     Effort: Pulmonary effort is normal.     Breath sounds: Normal breath sounds.  Abdominal:     Tenderness: There is no abdominal tenderness. There is no guarding.  Neurological:     Mental Status: She is alert.    MAU Course/MDM  Procedures  --Mildly elevated pressures on arrival to MAU. No severe range or severe symptoms. Will give daily Procardia 60 XL and continue to assess. Normal PEC labs two days ago in MAU. Will reaccomplish labs if patient develops sevre range bps or severe symptoms.  --Prevena dressing adhered to abdomen but vacuum portion of dressing is not approximated to incision and bottom margin of adhesive for overall dressing is not adhered to abdomen (see image below)  --Given 10 days since operation and ineffectiveness of dressing, will remove Prevena and assess integrity of wound  --Significant foul smell emanating from incision. Detectable upon entry to room. Possibly related to moisture and skin sloughing beneath poorly adhered Prevena. Incision well approximated and intact except for one pin-size area to maternal left. Serosanguinous drainage leaking from this small portion of incision whenever pressure is applied to proximal superior abdomen. Marked with steri strip after incision and abdomen were cleaned. Concern for abscess. Will order CT Abdomen Pelvis  --Pertinent negatives: abdominal tenderness, palpable masses, wound tunneling, fever, chills.  --Patient developed severe range BP while waiting to be moved to CT. Labetalol Protocol initiated. Patient did not receive Magnesium Sulfate while intrapartum. Discussed with Dr. Jolayne Panther. Patient appropriate for postpartum MgSO4 as indicated by severe range BP. Discussed with Dr. Mora Appl, who agrees with this assessment and will place orders for admission to [facility].  Patient Vitals  for the past 24 hrs:  BP Temp Temp src Pulse Resp SpO2  09/20/21 1330 (!) 142/85 -- -- 87 -- 91 %  09/20/21 1321 129/73 -- -- 86 20 93 %  09/20/21 1311 (!) 149/80 -- -- 85 (!) 22 93 %  09/20/21 1255 (!) 158/87 98.7 F (37.1 C) Oral 85 (!) 22 92 %  09/20/21 1242 (!) 145/80 -- -- 80 19 --  09/20/21 1215 (!) 172/99 -- -- 85 -- --  09/20/21 1201 (!) 167/102 -- -- 88 -- --  09/20/21 1145 (!) 156/86 -- -- 90 -- 97 %  09/20/21 1047 (!) 151/88 -- -- 89 -- 97 %  09/20/21 1017 (!) 146/85 -- --  97 -- --  09/20/21 1001 (!) 148/97 -- Oral (!) 103 19 96 %     ` `  Assessment and Plan  --30 y.o. [id number] s/p cesarean 09/10/2021 --Postpartum severe range BP, labs in process --Magnesium Sulfate initiated in MAU --Concern for wound abscess, target time for Abdominal CT is 1315 --Per Dr. Mora Appl,   Calvert Cantor, CNM 09/20/2021, 1:48 PM

## 2021-09-20 NOTE — H&P (Signed)
Postpartum Re-Admission History and Physical  Ms Shannon Francis is 30 year old G4P2011 POD# 10 s/p cesarean section on 09/10/2021 for FITL and "presumptive chorioamnionitis". Patient presents to MAU from [facility] office for elevated blood pressure and abnormal discharge from her wound vac.   Patient states her blood pressure was elevated during her visit in office this morning. She has not yet taken her daily Procardia 60 XL. Patient notes having a headache "since delivery", no visual disturbances, No RUQ/epigastric pain, new onset swelling or weight gain. Baby is in the NICU.  She noticed abnormal discharge from wound several days ago, no fever or chills. She notes voiding and having bowel movements w/o difficulty.    PMHX  Past Medical History:  Diagnosis Date   Headache    Morbid obesity (HCC)     PSURG HX  Past Surgical History:  Procedure Laterality Date   ANTERIOR CRUCIATE LIGAMENT REPAIR     left   CESAREAN SECTION N/A 09/10/2021   Procedure: CESAREAN SECTION;  Surgeon: Osborn Coho, MD;  Location: [facility];  Service: Obstetrics;  Laterality: N/A;   DILATATION & CURETTAGE/HYSTEROSCOPY WITH MYOSURE N/A 08/13/2018   Procedure: DILATATION & CURETTAGE/HYSTEROSCOPY WITH MYOSURE;  Surgeon: Olivia Mackie, MD;  Location: [facility];  Service: Gynecology;  Laterality: N/A;   INCISION AND DRAINAGE     chest   KNEE SURGERY      FAM HX  Family History  Problem Relation Age of Onset   Hypertension Mother    Diabetes Mother    Stroke Mother    Cancer Sister    Diabetes Other    Healthy Father     SOC HX  Social History   Socioeconomic History   Marital status: Married    Spouse name: Kiesha Ensey   Number of children: Not on file   Years of education: Not on file   Highest education level: Not on file  Occupational History   Not on file  Tobacco Use   Smoking status: Never   Smokeless tobacco: Never  Vaping Use   Vaping Use: Never used  Substance and Sexual  Activity   Alcohol use: No   Drug use: No   Sexual activity: Not Currently    Birth control/protection: None  Other Topics Concern   Not on file  Social History Narrative   Not on file   Social Determinants of Health   Financial Resource Strain: Not on file  Food Insecurity: No Food Insecurity   Worried About Programme researcher, broadcasting/film/video in the Last Year: Never true   Ran Out of Food in the Last Year: Never true  Transportation Needs: Not on file  Physical Activity: Not on file  Stress: Not on file  Social Connections: Not on file  Intimate Partner Violence: Not on file    ROS  Review of Systems - as per HPI  CT SCAN ABD / Pelvis:  IMPRESSION: 1. Exam detail is diminished due to patient body habitus. Ventral abdominal wall incision site is identified. This appears thickened measuring 2.1 cm in thickness. Along the left lateral margin of the incision site is a suspicion for small gas and fluid collection measuring 3.8 x 1.6 cm, which may represent small postop seroma or abscess. 2. Skin thickening and subcutaneous soft tissue stranding within the ventral abdominal wall noted which may reflect cellulitis. 3. Enlarged uterus compatible with early postpartum state. The thickness of the endometrial stripe is difficult to visualize and quantify due to factors described  above.  LABS CBC    Component Value Date/Time   WBC 7.3 09/20/2021 1242   RBC 4.24 09/20/2021 1242   HGB 11.1 (L) 09/20/2021 1242   HGB 12.1 12/01/2019 1459   HCT 35.4 (L) 09/20/2021 1242   PLT 396 09/20/2021 1242   PLT 280 12/01/2019 1459   MCV 83.5 09/20/2021 1242   MCH 26.2 09/20/2021 1242   MCHC 31.4 09/20/2021 1242   RDW 15.2 09/20/2021 1242   LYMPHSABS 2.3 09/18/2021 0047   MONOABS 0.8 09/18/2021 0047   EOSABS 0.4 09/18/2021 0047   BASOSABS 0.0 09/18/2021 0047     PHYSICAL EXAM Patient Vitals for the past 24 hrs:   BP Temp Temp src Pulse Resp SpO2  09/20/21 1330 (!) 142/85 -- -- 87 -- 91 %   09/20/21 1321 129/73 -- -- 86 20 93 %  09/20/21 1311 (!) 149/80 -- -- 85 (!) 22 93 %  09/20/21 1255 (!) 158/87 98.7 F (37.1 C) Oral 85 (!) 22 92 %  09/20/21 1242 (!) 145/80 -- -- 80 19 --  09/20/21 1215 (!) 172/99 -- -- 85 -- --  09/20/21 1201 (!) 167/102 -- -- 88 -- --  09/20/21 1145 (!) 156/86 -- -- 90 -- 97 %  09/20/21 1047 (!) 151/88 -- -- 89 -- 97 %  09/20/21 1017 (!) 146/85 -- -- 97 -- --  09/20/21 1001 (!) 148/97 -- Oral (!) 103 19 96 %   Gen WDWN  IN NAD ABD Soft, Non tender, non-distended, Incision: Pfannenstiel with right lateral and left lateral opening With foul smelling serosanguinous / purulent discharge Pus evacuated with squeeze and incision irrigated with sterile normal saline Incision probed with sterile q-tip and fascial line intact Wound packed with iodoform packing tape and dressed with ABD.    GU deferred EXT No C/C/E, no calf tenderness  Assessment: 30 year old POD#10 s/p primary cesarean section with post partum preeclampsia with severe features  And post operative wound infection.   Plan: Admit to Ob specialty care IV magnesium sulfate for seizure prophylaxis IV labetalol protocol for any severe range blood pressures.  Pre E labs done and are within normal limits - PCR pending PO labetalol 200 mg BID to start today IV Zosyn for wound infection SCDs and Lovenox for DVT prophylaxis.  Continue to do wet to dry packing of wound.   Naoma Diener Auston Halfmann

## 2021-09-20 NOTE — MAU Note (Signed)
.  Shannon Francis is a 30 y.o. postpartum c/s here in MAU reporting: drainage from her incision that started x2 days ago. States she was sent from her MD office for elevated BP. Last took her BP medication yesterday morning. Denies pain.

## 2021-09-20 NOTE — MAU Note (Signed)
CT to send transport to pick up patient now.

## 2021-09-20 NOTE — Lactation Note (Signed)
This note was copied from a baby's chart. Lactation Consultation Note  Patient Name: Shannon Francis [id number] Date: 09/20/2021   Age:30 days  Attempted to visit with mom of 52 days old ETI NICU female, but RN staff was working with her in her room at [facility], mom got readmitted to MAU on 09/18/21 due to elevated BP and abnormal discharge from her wound vac.  LC will visit again at a later time to check on pumping status and to see if mom wants to put baby to breast.    Will Schier S Miyeko Mahlum 09/20/2021, 2:57 PM

## 2021-09-21 MED ORDER — COVID-19 MRNA VACC (MODERNA) 100 MCG/0.5ML IM SUSP
0.5000 mL | Freq: Once | INTRAMUSCULAR | Status: AC
  Administered 2021-09-21: 0.5 mL via INTRAMUSCULAR
  Filled 2021-09-21: qty 0.5

## 2021-09-21 NOTE — Progress Notes (Signed)
MD Progress Note  HD #1 from re-admission Post Partum Day 11  Subjective: Patient has some abdominal pain, not severe. Patient denies headache, blurry vision, no scotomata.  She denies fever or chills, chest pain or shortness of breath  Objective: Blood pressure 134/76, pulse 83, temperature 98.3 F (36.8 C), temperature source Oral, resp. rate 20, SpO2 94 %, currently breastfeeding. Vital Signs in the last 24 hours: Temp:  [97.6 F (36.4 C)-98.4 F (36.9 C)] 98.3 F (36.8 C) (10/12 1142) Pulse Rate:  [81-97] 83 (10/12 1142) Resp:  [18-22] 20 (10/12 1142) BP: (122-149)/(62-85) 134/76 (10/12 1142) SpO2:  [91 %-98 %] 94 % (10/12 1142)  Physical Exam:  General: alert, cooperative, and no distress Lochia: minimal Uterine Fundus: firm Incision: slow healing, dehiscence present left lateral portion of incision and right lateral portion,                Bandages from yesterday saturated with purulent serosanguinous disharge               Packing removed and fresh packing and dressing placed.   DVT Evaluation: Negative Homan's sign. No cords or calf tenderness. SCDs on a running  Recent Labs    09/20/21 1242  HGB 11.1*  HCT 35.4*    Assessment/Plan: 30 year old POD #11 and HD#1 with post partum preeclampsia and wound infection Continue magnesium sulfate IV for seizure prophylaxis - will discontinue today at 13:25 which will be 24 hours.  Blood pressure better controlled now with PO labetalol 200 mg BID.  Will continue Zosyn IV, however will transition to oral for discharge Will contact wound care nurse to see if patient can have wound care as outpatient.    LOS: 1 day   [facility] 09/21/2021, 12:53 PM

## 2021-09-21 NOTE — TOC Progression Note (Signed)
Transition of Care [facility]) - Progression Note    Patient Details  Name: Shannon Francis MRN: [id number] Date of Birth: Nov 22, 1991  Transition of Care [facility]) CM/SW Contact  Curlene Labrum, RN Phone Number: 09/21/2021, 2:09 PM  Clinical Narrative:    Case management met with the patient at the bedside to discuss transitions of care.  The patient lives with her husband and will need wound care follow up in the home S/P C-Section 11 days ago with wound dehiscence and infection.  The patient is agreeable to home health support and does not have a preference for home health company.  CM called [place] [facility] and spoke with Bronwen Betters, RNCM with with [facility] and she accepted her for home health services for RN.  The patient's husband is in the home and will be able to assist with ordered dressing changes as needed outside of home health visits.  Home Health orders for RN was placed for physician, Dr. Alwyn Pea to co-sign.  Bedside nursing - please provide dressing supplies for home until home health is able to set up home visits this week with the patient and family.  Reminder was placed in the discharge instructions to follow up with Dr. Alwyn Pea.  CM also placed instructions to call and set up PCP appointment for medical physician as well.  TOC RNCM will continue to follow the patient for discharge needs for home.   Expected Discharge Plan: [place] Barriers to Discharge: Continued Medical Work up  Expected Discharge Plan and Services Expected Discharge Plan: [place]   Discharge Planning Services: CM Consult Post Acute Care Choice: [place] arrangements for the past 2 months: Apartment                           HH Arranged: RN   Date [place]: 09/21/21 Time [place]: 1359 Representative spoke with at [place]: Orland Mustard, [place] with [facility] for RN for wound care   Social Determinants of Health  (SDOH) Interventions    Readmission Risk Interventions Readmission Risk Prevention Plan 09/21/2021  Post Dischage Appt Complete  Medication Screening Complete  Transportation Screening Complete  Some recent data might be hidden

## 2021-09-22 MED ORDER — CEPHALEXIN 500 MG PO CAPS
500.0000 mg | ORAL_CAPSULE | Freq: Four times a day (QID) | ORAL | Status: DC
  Administered 2021-09-22 (×2): 500 mg via ORAL
  Filled 2021-09-22 (×2): qty 1

## 2021-09-22 MED ORDER — CEPHALEXIN 500 MG PO CAPS: 500.0000 mg | ORAL_CAPSULE | Freq: Four times a day (QID) | ORAL | 0 refills | Status: AC

## 2021-09-22 MED ORDER — LABETALOL HCL 200 MG PO TABS: 400.0000 mg | ORAL_TABLET | Freq: Two times a day (BID) | ORAL | 1 refills | Status: DC

## 2021-09-22 NOTE — Plan of Care (Signed)
  Problem: Education: Goal: Knowledge of General Education information will improve Description: Including pain rating scale, medication(s)/side effects and non-pharmacologic comfort measures Outcome: Completed/Met   Problem: Health Behavior/Discharge Planning: Goal: Ability to manage health-related needs will improve Outcome: Completed/Met   Problem: Clinical Measurements: Goal: Ability to maintain clinical measurements within normal limits will improve Outcome: Completed/Met Goal: Will remain free from infection Outcome: Completed/Met Goal: Diagnostic test results will improve Outcome: Completed/Met Goal: Respiratory complications will improve Outcome: Completed/Met Goal: Cardiovascular complication will be avoided Outcome: Completed/Met   Problem: Activity: Goal: Risk for activity intolerance will decrease Outcome: Completed/Met   Problem: Nutrition: Goal: Adequate nutrition will be maintained Outcome: Completed/Met   Problem: Coping: Goal: Level of anxiety will decrease Outcome: Completed/Met   Problem: Elimination: Goal: Will not experience complications related to bowel motility Outcome: Completed/Met Goal: Will not experience complications related to urinary retention Outcome: Completed/Met   Problem: Pain Managment: Goal: General experience of comfort will improve Outcome: Completed/Met   Problem: Safety: Goal: Ability to remain free from injury will improve Outcome: Completed/Met   Problem: Skin Integrity: Goal: Risk for impaired skin integrity will decrease Outcome: Completed/Met   Problem: Education: Goal: Knowledge of condition will improve Outcome: Completed/Met Goal: Individualized Educational Video(s) Outcome: Completed/Met Goal: Individualized Newborn Educational Video(s) Outcome: Completed/Met   Problem: Activity: Goal: Will verbalize the importance of balancing activity with adequate rest periods Outcome: Completed/Met Goal: Ability to  tolerate increased activity will improve Outcome: Completed/Met   Problem: Coping: Goal: Ability to identify and utilize available resources and services will improve Outcome: Completed/Met   Problem: Life Cycle: Goal: Chance of risk for complications during the postpartum period will decrease Outcome: Completed/Met   Problem: Role Relationship: Goal: Ability to demonstrate positive interaction with newborn will improve Outcome: Completed/Met   Problem: Skin Integrity: Goal: Demonstration of wound healing without infection will improve Outcome: Completed/Met   Problem: Education: Goal: Knowledge of disease or condition will improve Outcome: Completed/Met Goal: Knowledge of the prescribed therapeutic regimen will improve Outcome: Completed/Met   Problem: Fluid Volume: Goal: Peripheral tissue perfusion will improve Outcome: Completed/Met   Problem: Clinical Measurements: Goal: Complications related to disease process, condition or treatment will be avoided or minimized Outcome: Completed/Met

## 2021-09-22 NOTE — Progress Notes (Signed)
Wet to dry dressing change today.  Wound probed and found to be intact.   Wet to dry dressing change.   Pt tolerated it well.   DC home Instructions given BP still increased however not in severe range and pt desires to go home.  Will follow up in one week.

## 2021-09-22 NOTE — Final Progress Note (Signed)
Patient given discharge teaching on medications, wound care, and postpartum HTN. Went home with abd pads and gauzes. Educated on follow up care appointments and her home health care. Patient stable with no pain and ambulatory.

## 2021-09-22 NOTE — Discharge Summary (Signed)
ANTENATAL DISCHARGE SUMMARY  Patient ID: Shannon Francis MRN: [id number] DOB/AGE: 1991-10-17 30 y.o.  Admit date: 09/20/2021 Discharge date: 09/22/2021  Admission Diagnoses: Preeclampsia in postpartum period [O14.95]  Discharge Diagnoses: Preeclampsia in postpartum period [O14.95], Wound dehiscence         Discharged Condition: good  Hospital Course: Ms . Francis is a 30 year old G4P2011 POD#12 s/p cesarean section on 09/10/2021. Patient was admitted after evaluation in MAU for elevated blood pressures in [facility] office and for abnormal discharge from her wound vac. PP preeclampsia was treated with PO labetalol and patient was placed on 24 hours of magnesium sulfate for seizure prophylaxis. Zosyn IV was given for wound infection as well as daily wet to dry packing of the wound.    Patient Active Problem List   Diagnosis Date Noted   Preeclampsia in postpartum period 09/20/2021   Encounter for induction of labor 09/10/2021   Status post primary low transverse cesarean section 09/10/2021   Chorioamnionitis 09/10/2021   PPH (postpartum hemorrhage) 09/10/2021   Acute blood loss anemia 09/10/2021   Gestational diabetes mellitus 09/07/2021   Pregnancy affected by fetal growth restriction 09/07/2021   HSV-2 infection 09/07/2021   Gestational hypertension 09/07/2021   INFECTION, HUMAN PAPILLOMAVIRUS 06/10/2007   HYPERTROPHY, BREAST 05/10/2007   OBESITY, NOS 02/07/2007   ACNE 02/07/2007    Consults:  Case management  Plan: Begin Keflex 500mg  BID x 7 days Increase labetalol to 400mg  BID Follow up with [facility] in 7 days with BP and wound check.     Disposition: home with plan for home health to come and change wound dressings daily.   Allergies as of 09/22/2021       Reactions   Hydrocodone Nausea And Vomiting, Other (See Comments)   dizziness        Medication List     STOP taking these medications    acetaminophen 325 MG tablet Commonly known as: TYLENOL   aspirin 81 MG  chewable tablet   aspirin-acetaminophen-caffeine 250-250-65 MG tablet Commonly known as: EXCEDRIN MIGRAINE   cyclobenzaprine 10 MG tablet Commonly known as: FLEXERIL   ibuprofen 600 MG tablet Commonly known as: ADVIL   NIFEdipine 60 MG 24 hr tablet Commonly known as: ADALAT CC   oxycodone-acetaminophen 7.5-300 MG tablet Commonly known as: LYNOX   oxyCODONE-acetaminophen 7.5-325 MG tablet Commonly known as: Percocet   PRENATAL VITAMINS PO   Senexon-S 8.6-50 MG tablet Generic drug: senna-docusate       TAKE these medications    cephALEXin 500 MG capsule Commonly known as: KEFLEX Take 1 capsule (500 mg total) by mouth every 6 (six) hours for 7 days.   labetalol 200 MG tablet Commonly known as: NORMODYNE Take 2 tablets (400 mg total) by mouth 2 (two) times daily.               Discharge Care Instructions  (From admission, onward)           Start     Ordered   09/22/21 0000  Discharge wound care:       Comments: Wet to dry dressing changes q day via home health   09/22/21 1523            Follow-up Information     [facility] Follow up.   Specialty: Home Health Services Why: [facility] will call you to set up home health visits at the home.  A registered nurse will call you from the agency in  the next 24-48 hours to set up home health visits for wound care in the home. Contact information: [address] [place] [address] [phone]         Essie Hart, MD. Schedule an appointment as soon as possible for a visit.   Specialty: Obstetrics and Gynecology Why: Please call the office and schedule a follow up appointment through the office if you are not already scheduled by the physician when you are discharged home from the hospital. Contact information: [address] [place] [address] [phone]         [facility]. Schedule an appointment as soon as possible  for a visit.   Why: Please call the office and schedule a visit for a primary care physician.  Thank you. Contact information: [address] [place] [address] [phone]        [facility] Follow up in 1 week(s).   Specialty: Obstetrics and Gynecology Why: Call and make appointment for follow up in one week with Dr. Su Hilt for wound check and BP check Contact information: [address] [place] [address] [phone]                Signed: Oley Balm, CNM 09/22/2021, 4:42 PM

## 2021-09-24 ENCOUNTER — Encounter (HOSPITAL_COMMUNITY): Payer: Self-pay | Admitting: Obstetrics & Gynecology

## 2021-09-24 ENCOUNTER — Other Ambulatory Visit: Payer: Self-pay

## 2021-09-24 ENCOUNTER — Inpatient Hospital Stay (HOSPITAL_COMMUNITY)
Admission: AD | Admit: 2021-09-24 | Discharge: 2021-09-24 | Disposition: A | Discharge status: Home / Self Care | Payer: Federal, State, Local not specified - PPO | Source: Ambulatory Visit | Attending: Obstetrics & Gynecology | Admitting: Obstetrics & Gynecology

## 2021-09-24 DIAGNOSIS — O8600 Infection of obstetric surgical wound, unspecified: Secondary | ICD-10-CM | POA: Insufficient documentation

## 2021-09-24 DIAGNOSIS — O99893 Other specified diseases and conditions complicating puerperium: Secondary | ICD-10-CM | POA: Diagnosis present

## 2021-09-24 DIAGNOSIS — T8149XA Infection following a procedure, other surgical site, initial encounter: Secondary | ICD-10-CM

## 2021-09-24 DIAGNOSIS — O1495 Unspecified pre-eclampsia, complicating the puerperium: Secondary | ICD-10-CM | POA: Insufficient documentation

## 2021-09-24 DIAGNOSIS — O99215 Obesity complicating the puerperium: Secondary | ICD-10-CM | POA: Insufficient documentation

## 2021-09-24 LAB — CBC
HCT: 34.7 % — ABNORMAL LOW (ref 36.0–46.0)
Hemoglobin: 10.8 g/dL — ABNORMAL LOW (ref 12.0–15.0)
MCH: 26.5 pg (ref 26.0–34.0)
MCHC: 31.1 g/dL (ref 30.0–36.0)
MCV: 85 fL (ref 80.0–100.0)
Platelets: 376 10*3/uL (ref 150–400)
RBC: 4.08 MIL/uL (ref 3.87–5.11)
RDW: 15.2 % (ref 11.5–15.5)
WBC: 7.1 10*3/uL (ref 4.0–10.5)
nRBC: 0 % (ref 0.0–0.2)

## 2021-09-24 LAB — COMPREHENSIVE METABOLIC PANEL
ALT: 25 U/L (ref 0–44)
AST: 24 U/L (ref 15–41)
Albumin: 2.9 g/dL — ABNORMAL LOW (ref 3.5–5.0)
Alkaline Phosphatase: 102 U/L (ref 38–126)
Anion gap: 9 (ref 5–15)
BUN: 12 mg/dL (ref 6–20)
CO2: 27 mmol/L (ref 22–32)
Calcium: 8.8 mg/dL — ABNORMAL LOW (ref 8.9–10.3)
Chloride: 106 mmol/L (ref 98–111)
Creatinine, Ser: 1.12 mg/dL — ABNORMAL HIGH (ref 0.44–1.00)
GFR, Estimated: >60 mL/min (ref >60)
Glucose, Bld: 90 mg/dL (ref 70–99)
Potassium: 3.9 mmol/L (ref 3.5–5.1)
Sodium: 142 mmol/L (ref 135–145)
Total Bilirubin: 0.4 mg/dL (ref 0.3–1.2)
Total Protein: 6.5 g/dL (ref 6.5–8.1)

## 2021-09-24 LAB — URINALYSIS, ROUTINE W REFLEX MICROSCOPIC
Bacteria, UA: NONE SEEN
Bilirubin Urine: NEGATIVE
Glucose, UA: NEGATIVE mg/dL
Ketones, ur: NEGATIVE mg/dL
Nitrite: NEGATIVE
Protein, ur: 30 mg/dL — AB
RBC / HPF: >50 RBC/hpf — ABNORMAL HIGH (ref 0–5)
Specific Gravity, Urine: 1.023 (ref 1.005–1.030)
pH: 6 (ref 5.0–8.0)

## 2021-09-24 LAB — PROTEIN / CREATININE RATIO, URINE
Creatinine, Urine: 226.44 mg/dL
Protein Creatinine Ratio: 0.08 mg/mg{Cre} (ref 0.00–0.15)
Total Protein, Urine: 19 mg/dL

## 2021-09-24 MED ORDER — NIFEDIPINE 10 MG PO CAPS
10.0000 mg | ORAL_CAPSULE | ORAL | Status: DC | PRN
  Administered 2021-09-24: 10 mg via ORAL
  Filled 2021-09-24: qty 1

## 2021-09-24 MED ORDER — NIFEDIPINE ER OSMOTIC RELEASE 60 MG PO TB24: 60.0000 mg | ORAL_TABLET | Freq: Every day | ORAL | 2 refills | Status: DC

## 2021-09-24 MED ORDER — NIFEDIPINE 10 MG PO CAPS
20.0000 mg | ORAL_CAPSULE | ORAL | Status: DC | PRN
Start: 2021-09-24 — End: 2021-09-25
  Filled 2021-09-24: qty 2

## 2021-09-24 MED ORDER — NIFEDIPINE 10 MG PO CAPS
20.0000 mg | ORAL_CAPSULE | ORAL | Status: DC | PRN
  Administered 2021-09-24: 20 mg via ORAL

## 2021-09-24 MED ORDER — LABETALOL HCL 5 MG/ML IV SOLN: 40.0000 mg | INTRAVENOUS | Status: DC | PRN

## 2021-09-24 MED ORDER — HYDROCHLOROTHIAZIDE 25 MG PO TABS: 25.0000 mg | ORAL_TABLET | Freq: Every day | ORAL | 2 refills | Status: DC

## 2021-09-24 NOTE — MAU Provider Note (Signed)
Chief Complaint:  No chief complaint on file.   Event Date/Time   First Provider Initiated Contact with Patient 09/24/21 1947      HPI: Shannon Francis is a 30 y.o. [id number] who is postoperative day 15 following primary low transverse cesarean section for fetal intolerance of labor  and presents to maternity admissions reporting her wet to dry dressing for her postoperative wound infection has not been changed in 4 days since her discharge and there is yellow drainage.  She reports mild abdominal pain associated with the dressing. She was discharged on 09/22/21 with home health to manage daily dressing changes but reports that home health never came and she called without any response.  She does not have supplies to change the dressing and is concerned about the yellow drainage and saturated packing material.  She is taking her BP medication, labetalol 400 mg BID, as prescribed and denies h/a, epigastric pain or visual disturbances.    HPI  Past Medical History: Past Medical History:  Diagnosis Date   Headache    Morbid obesity (HCC)     Past obstetric history: OB History  Gravida Para Term Preterm AB Living  4 2 2  0 3 2  SAB IAB Ectopic Multiple Live Births  1 2 0 1 2    # Outcome Date GA Lbr Len/2nd Weight Sex Delivery Anes PTL Lv  4A IAB           4B Term 09/10/21 [redacted]w[redacted]d  2850 g M CS-LVertical Spinal  LIV  3 Term 02/2019 [redacted]w[redacted]d   M CS-LTranv Spinal  LIV  2 SAB           1 IAB             Past Surgical History: Past Surgical History:  Procedure Laterality Date   ANTERIOR CRUCIATE LIGAMENT REPAIR     left   CESAREAN SECTION N/A 09/10/2021   Procedure: CESAREAN SECTION;  Surgeon: 11/10/2021, MD;  Location: [facility] LD ORS;  Service: Obstetrics;  Laterality: N/A;   DILATATION & CURETTAGE/HYSTEROSCOPY WITH MYOSURE N/A 08/13/2018   Procedure: DILATATION & CURETTAGE/HYSTEROSCOPY WITH MYOSURE;  Surgeon: 10/13/2018, MD;  Location: [facility] ORS;  Service: Gynecology;  Laterality: N/A;    INCISION AND DRAINAGE     chest   KNEE SURGERY      Family History: Family History  Problem Relation Age of Onset   Hypertension Mother    Diabetes Mother    Stroke Mother    Cancer Sister    Diabetes Other    Healthy Father     Social History: Social History   Tobacco Use   Smoking status: Never   Smokeless tobacco: Never  Vaping Use   Vaping Use: Never used  Substance Use Topics   Alcohol use: No   Drug use: No    Allergies:  Allergies  Allergen Reactions   Hydrocodone Nausea And Vomiting and Other (See Comments)    dizziness    Meds:  Medications Prior to Admission  Medication Sig Dispense Refill Last Dose   cephALEXin (KEFLEX) 500 MG capsule Take 1 capsule (500 mg total) by mouth every 6 (six) hours for 7 days. 28 capsule 0 09/24/2021   labetalol (NORMODYNE) 200 MG tablet Take 2 tablets (400 mg total) by mouth 2 (two) times daily. 90 tablet 1 09/24/2021 at 900    ROS:  Review of Systems  Constitutional:  Negative for chills, fatigue and fever.  Eyes:  Negative for visual disturbance.  Respiratory:  Negative for shortness of breath.   Cardiovascular:  Negative for chest pain.  Gastrointestinal:  Negative for abdominal pain, nausea and vomiting.  Genitourinary:  Negative for difficulty urinating, dysuria, flank pain, pelvic pain, vaginal bleeding, vaginal discharge and vaginal pain.  Skin:  Positive for wound.  Neurological:  Negative for dizziness and headaches.  Psychiatric/Behavioral: Negative.      I have reviewed patient's Past Medical Hx, Surgical Hx, Family Hx, Social Hx, medications and allergies.   Physical Exam  Patient Vitals for the past 24 hrs:  BP Temp Temp src Pulse Resp SpO2  09/24/21 1945 (!) 160/84 -- -- 83 -- (!) 89 %  09/24/21 1935 (!) 166/105 -- -- 86 -- 98 %  09/24/21 1915 (!) 164/90 99.2 F (37.3 C) Oral 84 20 100 %      Constitutional: Well-developed, well-nourished female in no acute distress.  Cardiovascular: normal  rate Respiratory: normal effort GI: Abd soft, non-tender, gravid appropriate for gestational age.  MS: Extremities nontender, no edema, normal ROM Neurologic: Alert and oriented x 4.  GU: Neg CVAT. Integumetary/abdomen:  Dressing removed from incision, with ABD pad soaked with serous fluid, 2 openings, one on left and one on right marginal side of low transverse cesarean incision with abdominal packing. Packing removed by CNM, saturated on left with mildly purulent drainage on outside, serous further in to wound, and right side serosanginous drainage throughout.  Sterile cotton swab used to probe wound and it is shallow, tunneling to left/right side but not into fascia. No evidence of hematoma or abscess.  Wound on left and right sides pink, with granulation tissue noted and no edema or erythema noted, once packing is removed.      Labs: No results found for this or any previous visit (from the past 24 hour(s)). --/--/B POS (10/11 1250)  Imaging:    MAU Course/MDM: No orders of the defined types were placed in this encounter.   No orders of the defined types were placed in this encounter.   Wound evaluated by CNM, left opening with more evidence of purulent drainage on external 1/3 of dressing but serous further into wound, no evidence of purulent drainage on right side.   Dr Despina Hidden to room to evaluate wound and assess depth/healing. Wound repacked by Dr Despina Hidden and RN and pt discharged home. BP evaluated by Dr Despina Hidden, PO Procardia given in MAU and BPs out of severe range. No s/sx of PEC. BP medications changed from labetalol to Procardia 60 XL plus HCTZ 25 mg daily Pt to f/u with [facility]  Pt discharge with strict return precautions.    Assessment:  1. Preeclampsia in postpartum period   2. Wound infection after surgery     Plan: Discharge home   Sharen Counter Certified Nurse-Midwife 09/24/2021 7:47 PM

## 2021-09-24 NOTE — MAU Note (Signed)
C/s on 10/1.  Just got dc's a few days ago.  A nurse was supposed to come to the house for dressing changes and she never came, "so probably infected again".  So her OB told her to come here to have it packed.  Feeling ok, no pain.  "Wound is leaking".

## 2021-09-25 ENCOUNTER — Ambulatory Visit: Payer: Self-pay

## 2021-09-25 NOTE — Lactation Note (Signed)
This note was copied from a baby's chart. Lactation Consultation Note  Patient Name: Boy Rick Warnick [id number]'U Date: 09/25/2021 Reason for consult: Follow-up assessment;Primapara;1st time breastfeeding;NICU baby;Maternal endocrine disorder;Early term 37-38.6wks Age:30 wk.o.  Visited with mom of 30 weeks old ETI NICU female, she's a P1 and still not pumping consistently. Explained to mom the importance of consistent pumping to protect her supply.  Asked her if she needed any assistance with the latch but GOB (mom's dad) was going to give baby a bottle; he's on ad lib feedings now. Reviewed pumping schedule, expectations, lactogenesis III and supply/demand.  Plan of care:   Encouraged mom to start pumping every 3 hours, at least 8 pumping sessions/24 hours. Breast massage, hand expression and coconut oil were also encouraged prior pumping Mom to call NICU LC when she's ready to take baby to breast   GOB present. All questions and concerns answered, family to call NICU LC PRN.  Maternal Data   Mom's supply stays stable but she understands that it may start to dwindle due to inconsistent pumping  Feeding Mother's Current Feeding Choice: Breast Milk  Lactation Tools Discussed/Used Tools: Pump;Flanges Flange Size: 27 Breast pump type: Double-Electric Breast Pump Pump Education: Setup, frequency, and cleaning;Milk Storage Reason for Pumping: ETI in NICU Pumping frequency: 3-4 times/24 hours Pumped volume: 180 mL  Interventions Interventions: Education;DEBP;Breast feeding basics reviewed  Discharge Pump: DEBP;Personal (Spectra S2)  Consult Status Consult Status: Follow-up Date: 09/25/21 Follow-up type: In-patient   Rowland Ericsson Venetia Constable 09/25/2021, 4:05 PM

## 2021-10-10 DIAGNOSIS — L0390 Cellulitis, unspecified: Secondary | ICD-10-CM | POA: Diagnosis not present

## 2021-10-10 DIAGNOSIS — O900 Disruption of cesarean delivery wound: Secondary | ICD-10-CM | POA: Diagnosis not present

## 2021-10-12 DIAGNOSIS — O900 Disruption of cesarean delivery wound: Secondary | ICD-10-CM | POA: Diagnosis not present

## 2021-10-19 DIAGNOSIS — O900 Disruption of cesarean delivery wound: Secondary | ICD-10-CM | POA: Diagnosis not present

## 2021-10-19 DIAGNOSIS — T8140XA Infection following a procedure, unspecified, initial encounter: Secondary | ICD-10-CM | POA: Diagnosis not present

## 2021-10-31 DIAGNOSIS — T8140XA Infection following a procedure, unspecified, initial encounter: Secondary | ICD-10-CM | POA: Diagnosis not present

## 2021-12-15 ENCOUNTER — Emergency Department (HOSPITAL_BASED_OUTPATIENT_CLINIC_OR_DEPARTMENT_OTHER): Payer: Federal, State, Local not specified - PPO

## 2021-12-15 ENCOUNTER — Other Ambulatory Visit: Payer: Self-pay

## 2021-12-15 ENCOUNTER — Emergency Department (HOSPITAL_BASED_OUTPATIENT_CLINIC_OR_DEPARTMENT_OTHER)
Admission: EM | Admit: 2021-12-15 | Discharge: 2021-12-15 | Disposition: A | Discharge status: Home / Self Care | Payer: Federal, State, Local not specified - PPO | Attending: Emergency Medicine | Admitting: Emergency Medicine

## 2021-12-15 ENCOUNTER — Inpatient Hospital Stay (HOSPITAL_COMMUNITY)
Admission: AD | Admit: 2021-12-15 | Discharge: 2021-12-15 | Disposition: A | Discharge status: Home / Self Care | Payer: Federal, State, Local not specified - PPO | Source: Home / Self Care | Attending: Obstetrics and Gynecology | Admitting: Obstetrics and Gynecology

## 2021-12-15 ENCOUNTER — Encounter (HOSPITAL_BASED_OUTPATIENT_CLINIC_OR_DEPARTMENT_OTHER): Payer: Self-pay | Admitting: Emergency Medicine

## 2021-12-15 DIAGNOSIS — R1031 Right lower quadrant pain: Secondary | ICD-10-CM | POA: Diagnosis present

## 2021-12-15 DIAGNOSIS — D72829 Elevated white blood cell count, unspecified: Secondary | ICD-10-CM | POA: Insufficient documentation

## 2021-12-15 DIAGNOSIS — I10 Essential (primary) hypertension: Secondary | ICD-10-CM | POA: Diagnosis not present

## 2021-12-15 DIAGNOSIS — Z3202 Encounter for pregnancy test, result negative: Secondary | ICD-10-CM | POA: Insufficient documentation

## 2021-12-15 DIAGNOSIS — R109 Unspecified abdominal pain: Secondary | ICD-10-CM

## 2021-12-15 DIAGNOSIS — R1084 Generalized abdominal pain: Secondary | ICD-10-CM

## 2021-12-15 DIAGNOSIS — R739 Hyperglycemia, unspecified: Secondary | ICD-10-CM | POA: Diagnosis not present

## 2021-12-15 LAB — POCT PREGNANCY, URINE: Preg Test, Ur: NEGATIVE

## 2021-12-15 LAB — URINALYSIS, ROUTINE W REFLEX MICROSCOPIC
Bilirubin Urine: NEGATIVE
Glucose, UA: NEGATIVE mg/dL
Hgb urine dipstick: NEGATIVE
Ketones, ur: NEGATIVE mg/dL
Leukocytes,Ua: NEGATIVE
Nitrite: NEGATIVE
Specific Gravity, Urine: 1.026 (ref 1.005–1.030)
pH: 7 (ref 5.0–8.0)

## 2021-12-15 LAB — CBC WITH DIFFERENTIAL/PLATELET
Abs Immature Granulocytes: 0.04 10*3/uL (ref 0.00–0.07)
Basophils Absolute: 0 10*3/uL (ref 0.0–0.1)
Basophils Relative: 0 %
Eosinophils Absolute: 0 10*3/uL (ref 0.0–0.5)
Eosinophils Relative: 0 %
HCT: 38.3 % (ref 36.0–46.0)
Hemoglobin: 12.1 g/dL (ref 12.0–15.0)
Immature Granulocytes: 0 %
Lymphocytes Relative: 10 %
Lymphs Abs: 1.6 10*3/uL (ref 0.7–4.0)
MCH: 25.5 pg — ABNORMAL LOW (ref 26.0–34.0)
MCHC: 31.6 g/dL (ref 30.0–36.0)
MCV: 80.6 fL (ref 80.0–100.0)
Monocytes Absolute: 0.8 10*3/uL (ref 0.1–1.0)
Monocytes Relative: 5 %
Neutro Abs: 13.3 10*3/uL — ABNORMAL HIGH (ref 1.7–7.7)
Neutrophils Relative %: 85 %
Platelets: 287 10*3/uL (ref 150–400)
RBC: 4.75 MIL/uL (ref 3.87–5.11)
RDW: 15.3 % (ref 11.5–15.5)
WBC: 15.8 10*3/uL — ABNORMAL HIGH (ref 4.0–10.5)
nRBC: 0 % (ref 0.0–0.2)

## 2021-12-15 LAB — COMPREHENSIVE METABOLIC PANEL
ALT: 20 U/L (ref 0–44)
AST: 15 U/L (ref 15–41)
Albumin: 3.6 g/dL (ref 3.5–5.0)
Alkaline Phosphatase: 101 U/L (ref 38–126)
Anion gap: 5 (ref 5–15)
BUN: 16 mg/dL (ref 6–20)
CO2: 30 mmol/L (ref 22–32)
Calcium: 9 mg/dL (ref 8.9–10.3)
Chloride: 103 mmol/L (ref 98–111)
Creatinine, Ser: 0.87 mg/dL (ref 0.44–1.00)
GFR, Estimated: >60 mL/min (ref >60)
Glucose, Bld: 102 mg/dL — ABNORMAL HIGH (ref 70–99)
Potassium: 4.6 mmol/L (ref 3.5–5.1)
Sodium: 138 mmol/L (ref 135–145)
Total Bilirubin: 0.6 mg/dL (ref 0.3–1.2)
Total Protein: 6.8 g/dL (ref 6.5–8.1)

## 2021-12-15 LAB — LIPASE, BLOOD: Lipase: 26 U/L (ref 11–51)

## 2021-12-15 LAB — PREGNANCY, URINE: Preg Test, Ur: NEGATIVE

## 2021-12-15 MED ORDER — TRAMADOL HCL 50 MG PO TABS: ORAL_TABLET | ORAL | 0 refills | Status: DC

## 2021-12-15 MED ORDER — PANTOPRAZOLE SODIUM 40 MG IV SOLR
40.0000 mg | Freq: Once | INTRAVENOUS | Status: AC
  Administered 2021-12-15: 40 mg via INTRAVENOUS
  Filled 2021-12-15: qty 40

## 2021-12-15 MED ORDER — ONDANSETRON HCL 4 MG/2ML IJ SOLN
4.0000 mg | Freq: Once | INTRAMUSCULAR | Status: AC
  Administered 2021-12-15: 4 mg via INTRAVENOUS
  Filled 2021-12-15: qty 2

## 2021-12-15 MED ORDER — IOHEXOL 300 MG/ML  SOLN
100.0000 mL | Freq: Once | INTRAMUSCULAR | Status: AC | PRN
  Administered 2021-12-15: 100 mL via INTRAVENOUS

## 2021-12-15 MED ORDER — LACTATED RINGERS IV BOLUS
1000.0000 mL | Freq: Once | INTRAVENOUS | Status: AC
  Administered 2021-12-15: 1000 mL via INTRAVENOUS

## 2021-12-15 MED ORDER — ACETAMINOPHEN 500 MG PO TABS
1000.0000 mg | ORAL_TABLET | Freq: Once | ORAL | Status: AC
  Administered 2021-12-15: 1000 mg via ORAL
  Filled 2021-12-15: qty 2

## 2021-12-15 MED ORDER — ONDANSETRON 4 MG PO TBDP: 4.0000 mg | ORAL_TABLET | ORAL | 0 refills | Status: DC | PRN

## 2021-12-15 MED ORDER — PANTOPRAZOLE SODIUM 20 MG PO TBEC: 20.0000 mg | DELAYED_RELEASE_TABLET | Freq: Every day | ORAL | 0 refills | Status: DC

## 2021-12-15 NOTE — ED Notes (Signed)
Patient transported to CT 

## 2021-12-15 NOTE — ED Provider Notes (Addendum)
Postpartum, breast-feeding.  Follow-up CT for final disposition. Physical Exam  BP (!) 159/95 (BP Location: Right Arm)    Pulse 86    Temp 98.8 F (37.1 C) (Oral)    Resp 20    Ht 5\' 5"  (1.651 m)    Wt (!) 161.5 kg    SpO2 97%    Breastfeeding Yes    BMI 59.24 kg/m   Physical Exam  Procedures  Procedures  ED Course / MDM    Medical Decision Making CT scan is unremarkable.  I have reviewed a plan with the patient to employ dietary changes for possible biliary colic.  Have also discussed the need for follow-up with her PCP for further evaluation of biliary symptoms.  I will start the patient on daily Protonix for possible GERD and reflux.  Zofran given for nausea if needed.  Return precautions included in discharge instructions.  Patient did not wish to be discharged without additional pain medication.  At this time I have discussed breast-feeding with the patient.  She reports her breast milk supply is dwindling and she does not plan on continuing breast-feeding.  I advised I will prescribe tramadol for pain but it is not recommended with breast-feeding.  Patient voices understanding.       Charlesetta Shanks, MD 12/15/21 1008    Charlesetta Shanks, MD 12/15/21 1104

## 2021-12-15 NOTE — ED Triage Notes (Signed)
°  Patient comes in with abdominal pain and emesis that has been going on for two days. Patient had c-section in 11/22.  States pain is cramping/throbbing around her belly button.  Patient has had several episodes of emesis with last occurrence about 1 hour ago.  Pain 7/10.

## 2021-12-15 NOTE — Discharge Instructions (Addendum)
1.  Start taking Protonix daily as prescribed.  This to help with any reflux or acid secretion causing gastritis.  These problems are sometimes common around pregnancy. 2.  Follow instructions around the gallbladder eating plan.  You may have problems with your gallbladder and need further evaluation with your doctor. 3.  You may take extra strength Tylenol every 6 hours if needed for additional pain control.  I Anticipate with dietary modification and Protonix, your pain will improve over the next couple of days.  If your symptoms are worsening, you are getting fevers, vomiting or other concerning symptoms, return to the emergency department.

## 2021-12-15 NOTE — ED Notes (Signed)
Dc instructions reviewed with patient. Patient voiced understanding. Dc with belongings.  °

## 2021-12-15 NOTE — MAU Note (Signed)
..  Shannon Francis is a 31 y.o. C-section on 09/10/2021 here in MAU reporting: abdominal pain that began yesterday morning, reports that the pain is mid-abdominal, constant, and throbbing. Reports some nausea/vomiting, twice today.  Denies vaginal bleeding.  LMP: Jan last year Pain score: 7/10 Vitals:   12/15/21 0348  BP: (!) 150/87  Pulse: 92  Resp: 18  Temp: 98.9 F (37.2 C)  SpO2: 97%   Labs ordered in triage: pregnancy test

## 2021-12-15 NOTE — MAU Provider Note (Signed)
None     S Ms. CHANTRELLE VALCARCEL is a 31 y.o. [phone] patient who presents to MAU today with complaint of abdominal pain since yesterday. Has not called her primary doctor for evaluation.  Not sure if she is pregnant.   RN Note: JAEMI YEARIAN is a 31 y.o. C-section on 09/10/2021 here in MAU reporting: abdominal pain that began yesterday morning, reports that the pain is mid-abdominal, constant, and throbbing. Reports some nausea/vomiting, twice today.  Denies vaginal bleeding.  LMP: Jan last year Pain score: 7/10  O BP (!) 150/87 (BP Location: Right Arm)    Pulse 92    Temp 98.9 F (37.2 C) (Oral)    Resp 18    Ht 5\' 5"  (1.651 m)    Wt (!) 161.8 kg    SpO2 97%    BMI 59.36 kg/m  Physical Exam Constitutional:      General: She is not in acute distress.    Appearance: She is not ill-appearing or toxic-appearing.  HENT:     Head: Normocephalic.  Cardiovascular:     Rate and Rhythm: Normal rate.  Pulmonary:     Effort: Pulmonary effort is normal.  Neurological:     General: No focal deficit present.     Mental Status: She is alert.   Pregnancy test negative.  A Medical screening exam complete Abdominal pain  P Discharge from MAU in stable condition Patient given the option of transfer to [facility] for further evaluation or seek care in outpatient facility of choice Prefers to go to [facility] ED  Warning signs for worsening condition that would warrant emergency follow-up discussed Patient may return to MAU as needed   Seabron Spates, CNM 12/15/2021 4:05 AM

## 2021-12-15 NOTE — ED Provider Notes (Signed)
[facility] EMERGENCY DEPT Provider Note   CSN: [id number] Arrival date & time: 12/15/21  0447     History  Chief Complaint  Patient presents with   Abdominal Pain   Emesis    Shannon Francis is a 31 y.o. female.  The history is provided by the patient.  Abdominal Pain Associated symptoms: vomiting   Emesis Associated symptoms: abdominal pain   She has 2 months postpartum C-section and has history of hypertension and comes in with pain across her mid abdomen and lower abdomen for the last 2 days.  Pain is constant and is getting worse.  She currently rates pain at 6/10.  There is associated nausea and vomiting.  Pain is not improved following emesis.  She has been able to eat and pain is not worse after eating.  She has been having bowel movements and pain is not affected by bowel movements.  It is worse when she walks.  There is no radiation of pain to the chest, shoulder, back.  She denies any urinary difficulty.  Of note, she is breast-feeding.  She went to maternal admissions unit where pregnancy test was negative and she was sent here.  At home, she had tried taking acetaminophen without any improvement.   Home Medications Prior to Admission medications   Medication Sig Start Date End Date Taking? Authorizing Provider  hydrochlorothiazide (HYDRODIURIL) 25 MG tablet Take 1 tablet (25 mg total) by mouth daily. 09/24/21   Leftwich-Kirby, Wilmer Floor, CNM  NIFEdipine (PROCARDIA XL) 60 MG 24 hr tablet Take 1 tablet (60 mg total) by mouth daily. 09/24/21   Leftwich-Kirby, Wilmer Floor, CNM      Allergies    Hydrocodone    Review of Systems   Review of Systems  Gastrointestinal:  Positive for abdominal pain and vomiting.  All other systems reviewed and are negative.  Physical Exam Updated Vital Signs BP (!) 159/95 (BP Location: Right Arm)    Pulse 86    Temp 98.8 F (37.1 C) (Oral)    Resp 20    Ht 5\' 5"  (1.651 m)    Wt (!) 161.5 kg    SpO2 97%    Breastfeeding Yes    BMI  59.24 kg/m  Physical Exam Vitals and nursing note reviewed.  31 year old female, resting comfortably and in no acute distress. Vital signs are significant for elevated blood pressure. Oxygen saturation is 97%, which is normal. Head is normocephalic and atraumatic. PERRLA, EOMI. Oropharynx is clear. Neck is nontender and supple without adenopathy or JVD. Back is nontender and there is no CVA tenderness. Lungs are clear without rales, wheezes, or rhonchi. Chest is nontender. Heart has regular rate and rhythm without murmur. Abdomen is soft, flat, with moderate tenderness which is maximum in the right mid abdomen.  There is no rebound or guarding.  There are no masses or hepatosplenomegaly and peristalsis is slightly hypoactive. Extremities have 1+ edema, full range of motion is present. Skin is warm and dry without rash. Neurologic: Mental status is normal, cranial nerves are intact, moves all extremities equally/.  ED Results / Procedures / Treatments   Labs (all labs ordered are listed, but only abnormal results are displayed) Labs Reviewed  CBC WITH DIFFERENTIAL/PLATELET - Abnormal; Notable for the following components:      Result Value   WBC 15.8 (*)    MCH 25.5 (*)    Neutro Abs 13.3 (*)    All other components within normal limits  URINALYSIS, ROUTINE  W REFLEX MICROSCOPIC - Abnormal; Notable for the following components:   Protein, ur TRACE (*)    All other components within normal limits  PREGNANCY, URINE  COMPREHENSIVE METABOLIC PANEL  LIPASE, BLOOD   Radiology No results found.  Procedures Procedures    Medications Ordered in ED Medications - No data to display  ED Course/ Medical Decision Making/ A&P                           Medical Decision Making  Abdominal pain and patient was 2 months postpartum C-section.  Differential is broad and includes pain from adhesions, small bowel obstruction, viral gastritis, appendicitis, cholecystitis, pancreatitis,  diverticulitis, pyelonephritis, urolithiasis.  Because she is breast-feeding, will hold off on narcotic administration.  She is given IV fluids and ondansetron and will check screening labs as well as CT of abdomen and pelvis.  CBC shows leukocytosis with WBC of 15.8 and 85% neutrophils.  Metabolic panel and lipase are pending as is CT of abdomen and pelvis.  Case is signed out to Dr. Donnald Garre.        Final Clinical Impression(s) / ED Diagnoses Final diagnoses:  Right sided abdominal pain    Rx / DC Orders ED Discharge Orders     None         Dione Booze, MD 12/15/21 [phone]

## 2021-12-16 ENCOUNTER — Telehealth: Payer: Self-pay

## 2021-12-16 NOTE — Telephone Encounter (Signed)
Transition Care Management Unsuccessful Follow-up Telephone Call  Date of discharge and from where:  12/15/2021[facility]  Attempts:  1st Attempt  Reason for unsuccessful TCM follow-up call:  Unable to reach patient

## 2021-12-19 NOTE — Telephone Encounter (Signed)
Transition Care Management Unsuccessful Follow-up Telephone Call  Date of discharge and from where:  12/15/2021[facility]  Attempts:  2nd Attempt  Reason for unsuccessful TCM follow-up call:  Unable to reach patient

## 2021-12-20 NOTE — Telephone Encounter (Signed)
Transition Care Management Unsuccessful Follow-up Telephone Call  Date of discharge and from where:  12/15/2021[facility]  Attempts:  3rd Attempt  Reason for unsuccessful TCM follow-up call:  Unable to reach patient

## 2022-02-24 DIAGNOSIS — N920 Excessive and frequent menstruation with regular cycle: Secondary | ICD-10-CM | POA: Diagnosis not present

## 2022-03-15 ENCOUNTER — Other Ambulatory Visit (HOSPITAL_COMMUNITY): Payer: Self-pay

## 2022-03-15 ENCOUNTER — Other Ambulatory Visit: Payer: Self-pay

## 2022-03-16 ENCOUNTER — Other Ambulatory Visit (HOSPITAL_COMMUNITY): Payer: Self-pay

## 2022-03-17 ENCOUNTER — Other Ambulatory Visit (HOSPITAL_COMMUNITY): Payer: Self-pay

## 2022-03-27 DIAGNOSIS — R0683 Snoring: Secondary | ICD-10-CM | POA: Diagnosis not present

## 2022-03-27 DIAGNOSIS — Z131 Encounter for screening for diabetes mellitus: Secondary | ICD-10-CM | POA: Diagnosis not present

## 2022-03-27 DIAGNOSIS — Z0000 Encounter for general adult medical examination without abnormal findings: Secondary | ICD-10-CM | POA: Diagnosis not present

## 2022-04-05 DIAGNOSIS — N920 Excessive and frequent menstruation with regular cycle: Secondary | ICD-10-CM | POA: Diagnosis not present

## 2022-04-17 DIAGNOSIS — G4710 Hypersomnia, unspecified: Secondary | ICD-10-CM | POA: Diagnosis not present

## 2022-04-17 DIAGNOSIS — Z6841 Body Mass Index (BMI) 40.0 and over, adult: Secondary | ICD-10-CM | POA: Diagnosis not present

## 2022-06-12 IMAGING — US US OB < 14 WEEKS - US OB TV
1 series · 15 of 28 positions shown · non-contrast
Comparison: Obstetrical ultrasound from prior gestation 02/12/2019

CLINICAL DATA: Cramping for 3 days

EXAM:
OBSTETRIC <14 WK US AND TRANSVAGINAL OB US
TECHNIQUE: Both transabdominal and transvaginal ultrasound examinations were
performed for complete evaluation of the gestation as well as the
maternal uterus, adnexal regions, and pelvic cul-de-sac.
Transvaginal technique was performed to assess early pregnancy.

[Series 1: us ob < 14 weeks - us ob tv · 15 of 60 slices shown]
[im 1/60]
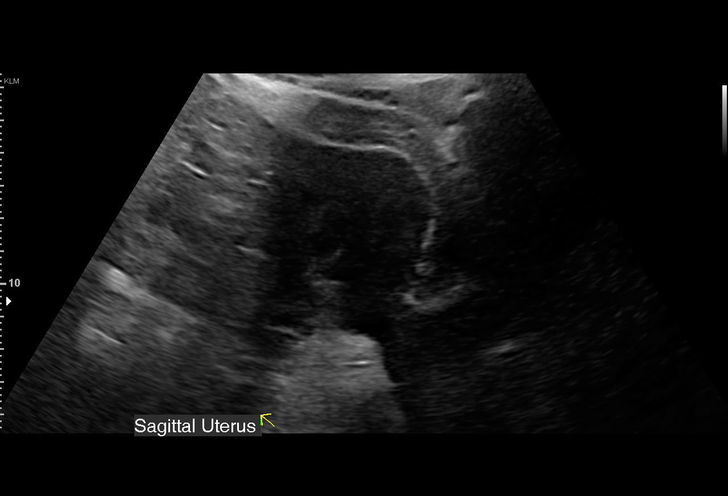
[im 5/60]
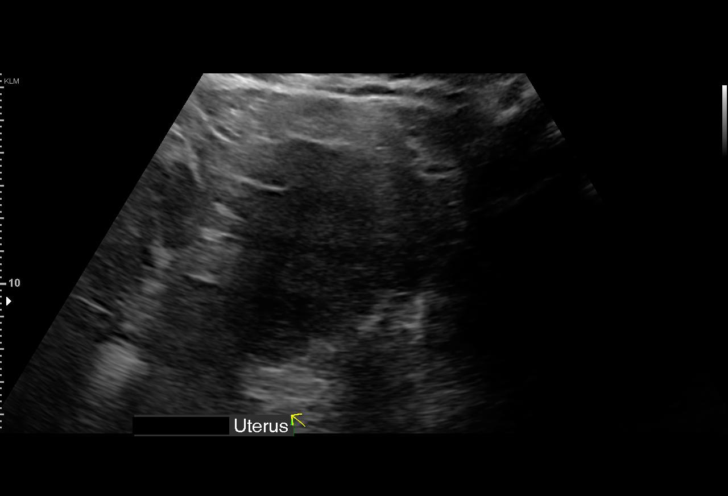
[im 9/60]
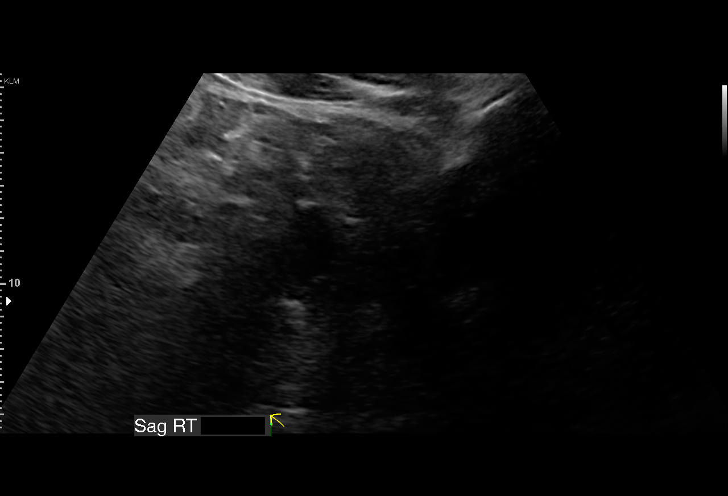
[im 14/60]
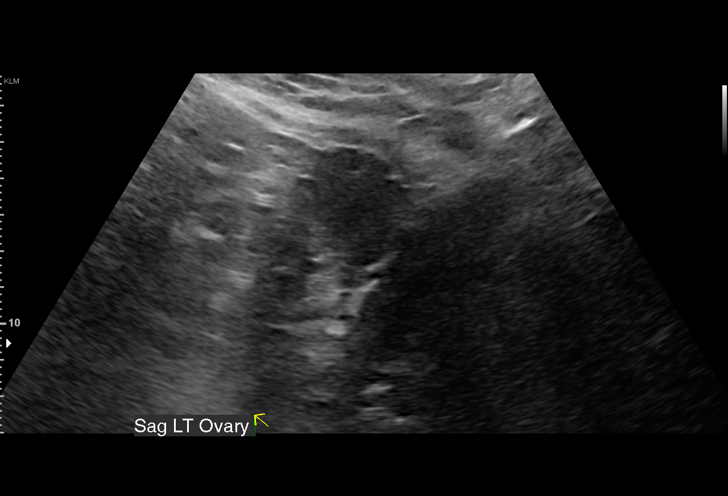
[im 18/60]
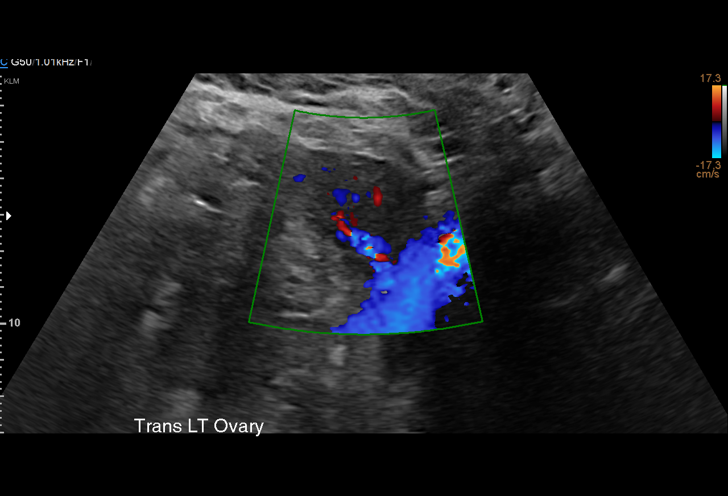
[im 22/60]
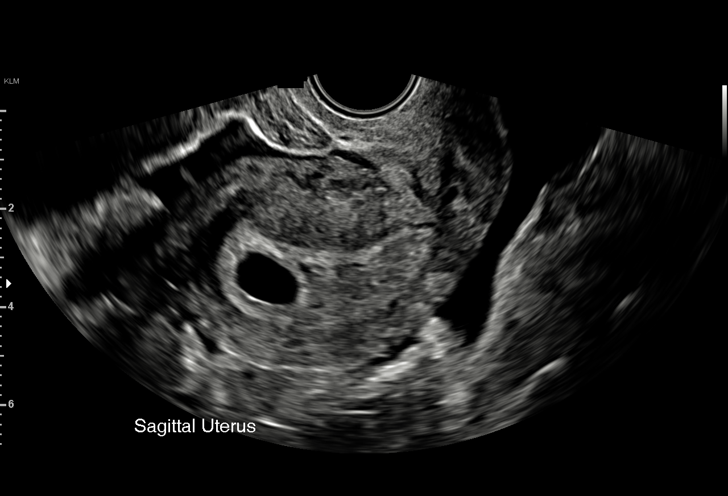
[im 27/60]
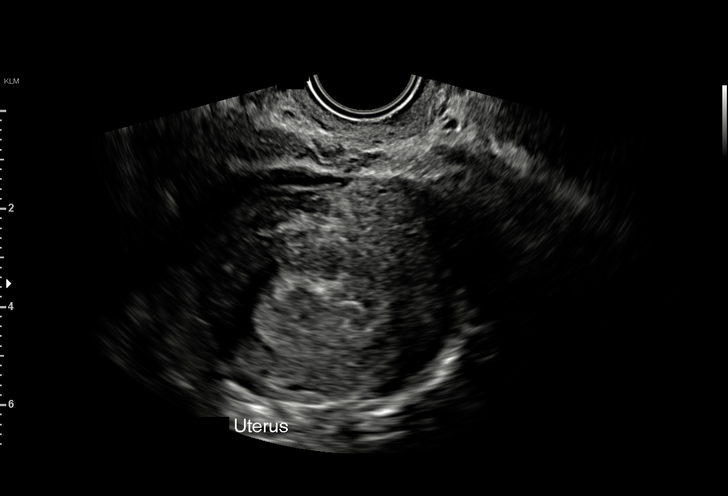
[im 31/60]
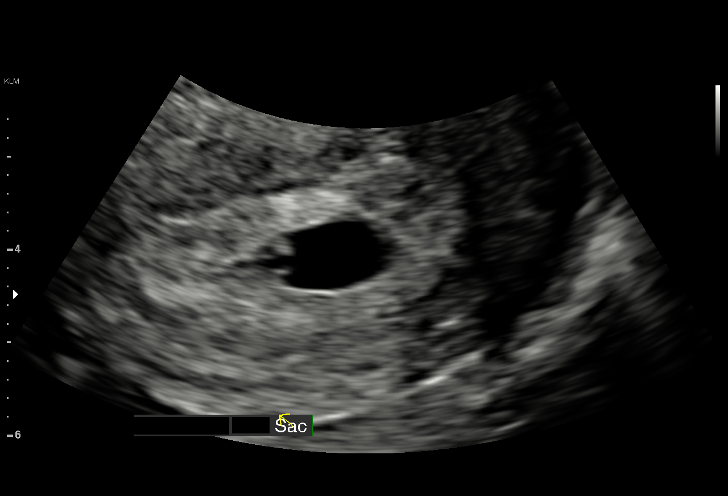
[im 33/60]
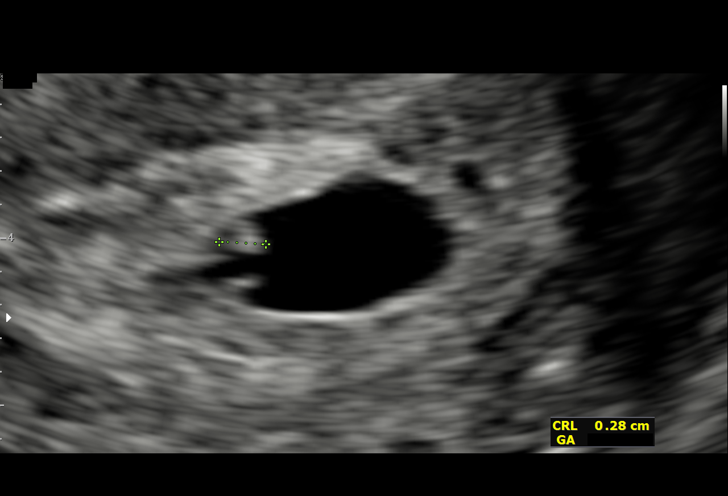
[im 38/60]
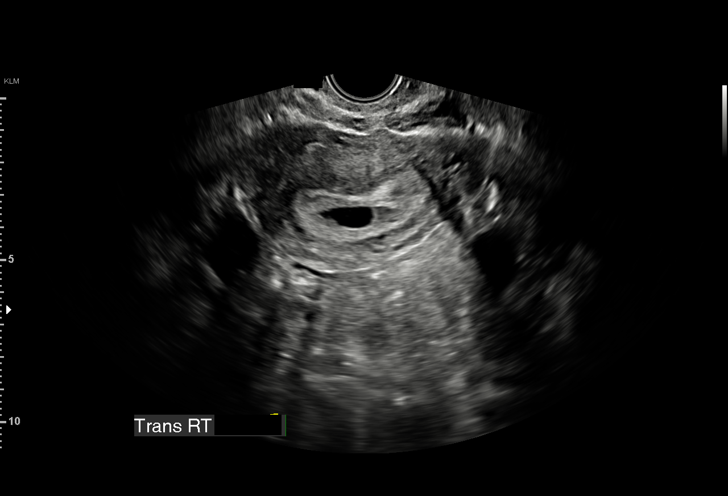
[im 42/60]
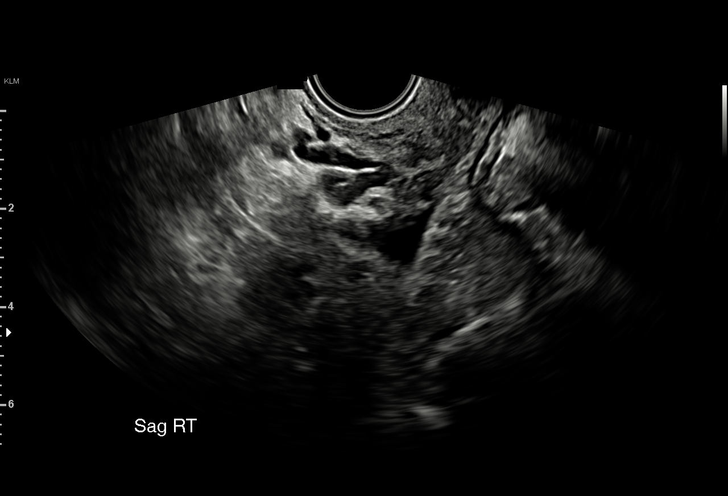
[im 46/60]
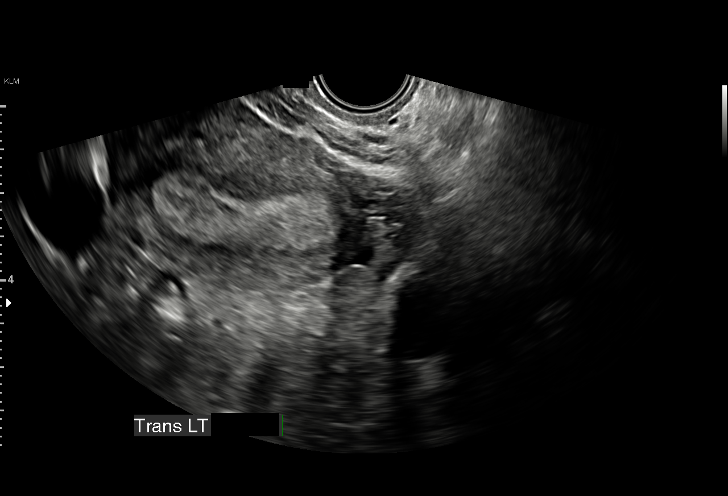
[im 51/60]
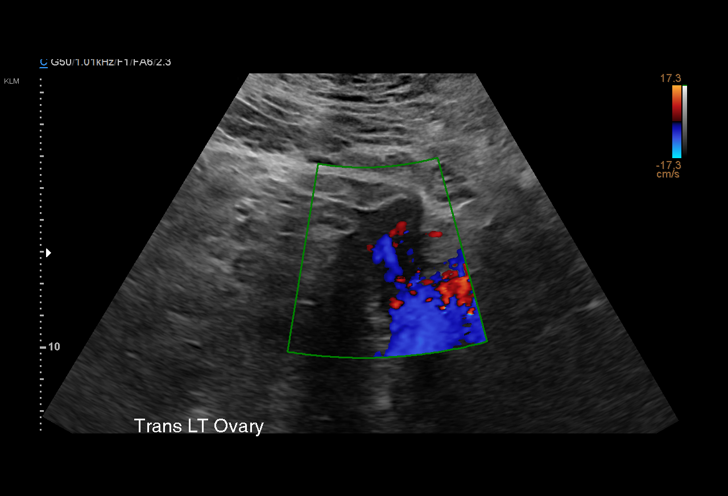
[im 55/60]
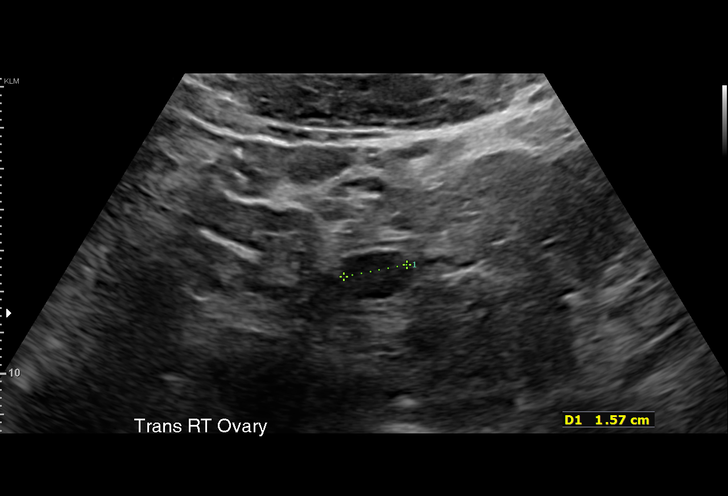
[im 60/60]
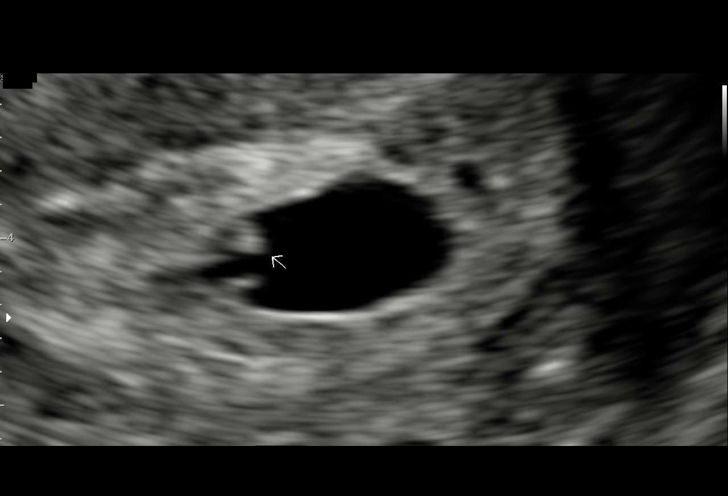

[15 of 28 positions shown; findings below may reference images not displayed]

FINDINGS: Intrauterine gestational sac: Single

Yolk sac:  Visualized.

Embryo:  Visualized.

Cardiac Activity: Visualized.

Heart Rate: 91 bpm

CRL:  2.7 mm   5 w   5 d                  US EDC: 09/25/2021

Subchorionic hemorrhage:  None visualized.

Maternal uterus/adnexae: Anteverted maternal uterus. Small to
moderate volume anechoic free fluid in the pelvis is nonspecific.
Ovaries are unremarkable. Technically challenging examination given
patient body habitus.
IMPRESSION: Technically challenging examination given patient body habitus.

Single intrauterine gestation at 5 weeks, 5 days by crown-rump
length sonographic estimation.

Borderline fetal bradycardia, nonspecific given the early age of
gestation. Could consider follow-up sonography to ensure continued
viability in 7-10 days.

## 2022-10-06 NOTE — Progress Notes (Signed)
Reviewed pt chart for surgery scheduled @ [facility] on 10-16-2022 by Dr Ivin Poot.  Noted pt's BMI is over 55 ( in epic it is 41).  Per anesthesia guidelines for ambulatory surgery center pt will need to be done at main OR since pt BMI over 55. Called and left message for Janit Pagan, OR scheduler , informed pt will need to be done at main OR.

## 2022-10-09 ENCOUNTER — Other Ambulatory Visit: Payer: Self-pay | Admitting: Obstetrics and Gynecology

## 2022-10-13 ENCOUNTER — Other Ambulatory Visit: Payer: Self-pay

## 2022-10-13 ENCOUNTER — Encounter (HOSPITAL_COMMUNITY): Payer: Self-pay | Admitting: Obstetrics and Gynecology

## 2022-10-13 NOTE — Progress Notes (Addendum)
Shannon Francis denies chest o=pain or shortness of breath. Patient denies having any s/s of Covid in her household, also denies any known exposure to Covid.   PCP is Dr. Aurelio Jew, patient has seen Dr. Glennon Mac for swelling in her lower legs and feet, patient has been put on Lasix, patient also saw Sammuel Hines, NP for a cardiology office, support hose was ordered. Shannon Bouchie states that she sometimes falls a sleep in the daytime and she is not aware of this. Ms. Glennon Mac, PA-C wanted patient to have a sleep study, this has not happened at this point.  I instructed Shannon Jentsch to hold  Advil and Excedrin Migraine, because of they are NSAIDS.

## 2022-10-14 IMAGING — US US MFM OB FOLLOW-UP
1 series · 13 of 28 positions shown · non-contrast
Comparison: none

[Series 1: us mfm ob follow-up · 13 of 79 slices shown]
[im 3/79]
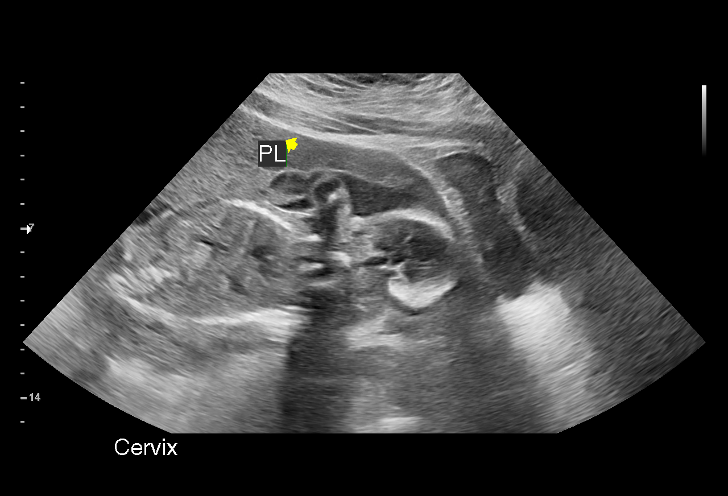
[im 9/79]
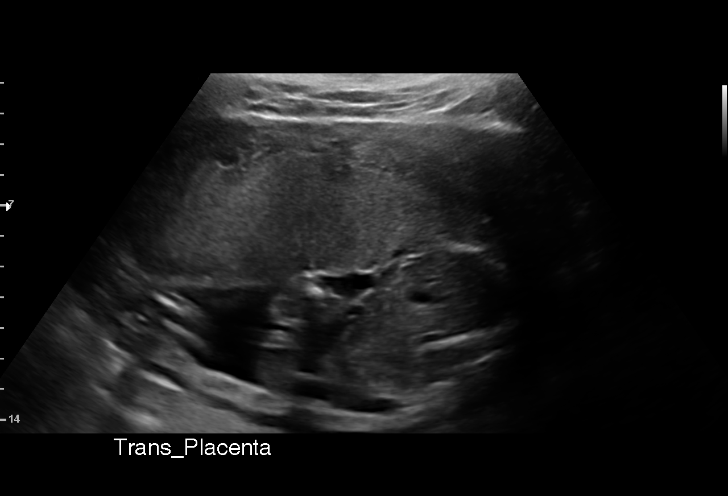
[im 15/79]
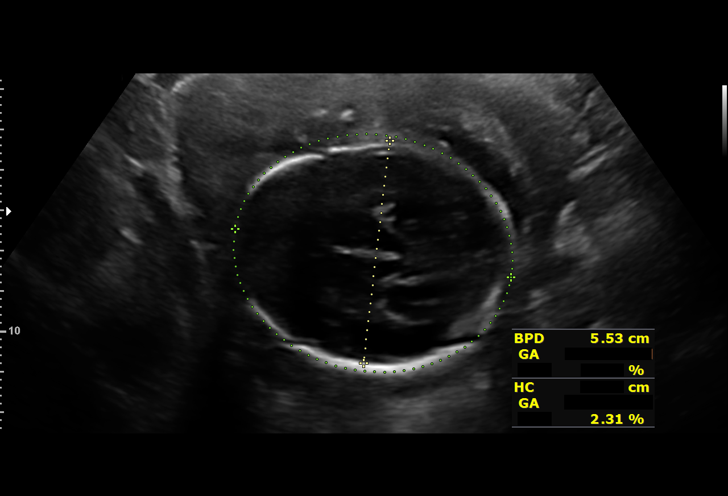
[im 21/79]
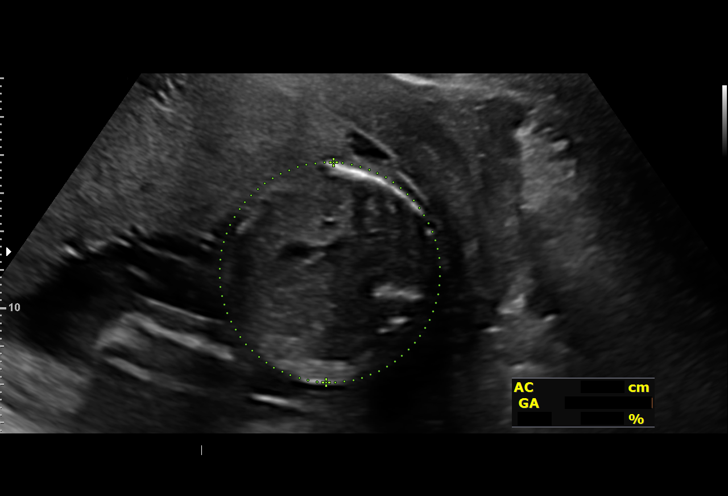
[im 27/79]
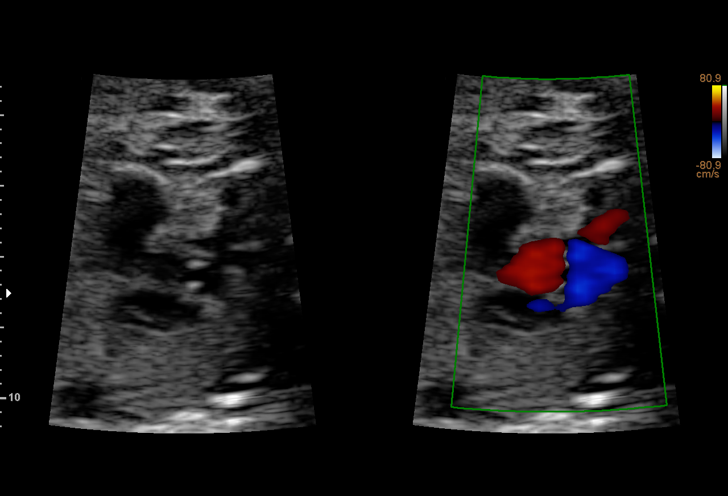
[im 32/79]
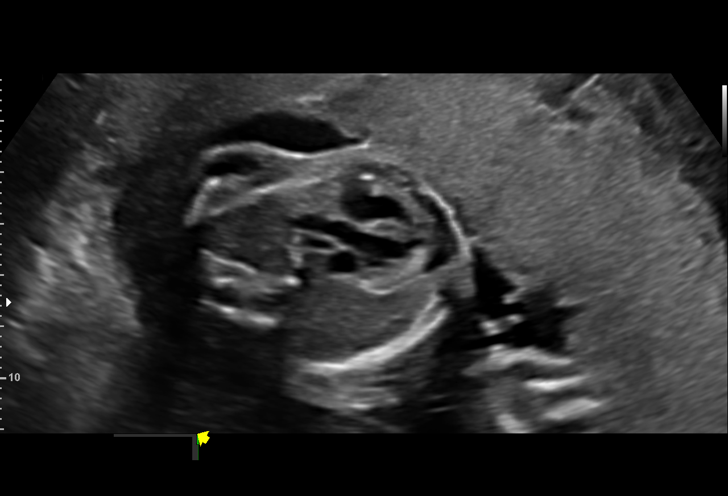
[im 41/79]
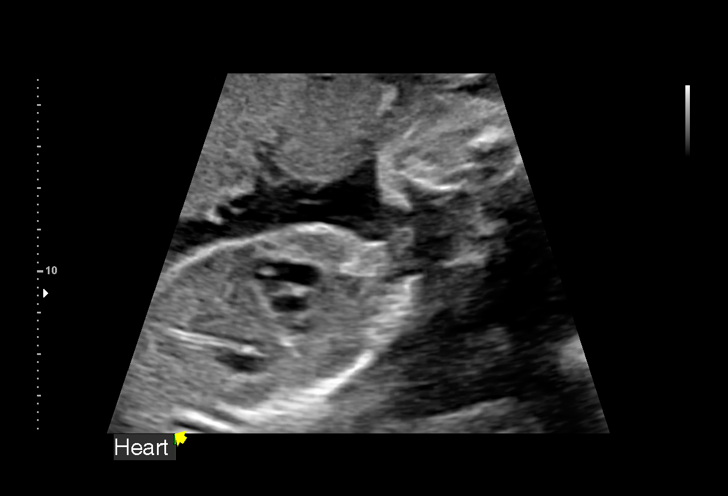
[im 47/79]
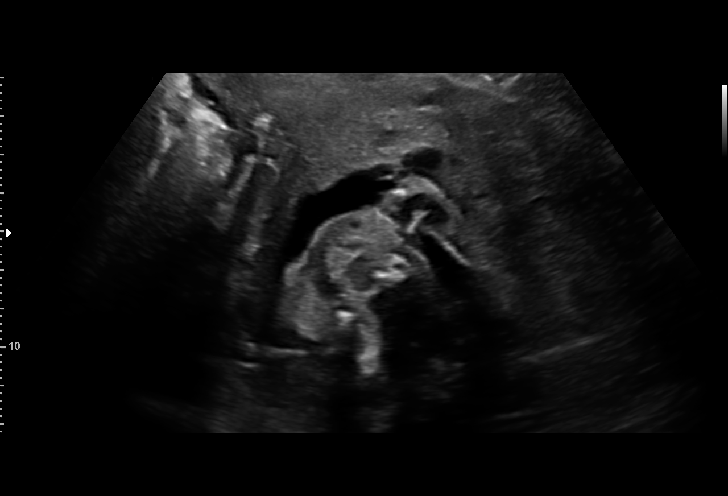
[im 53/79]
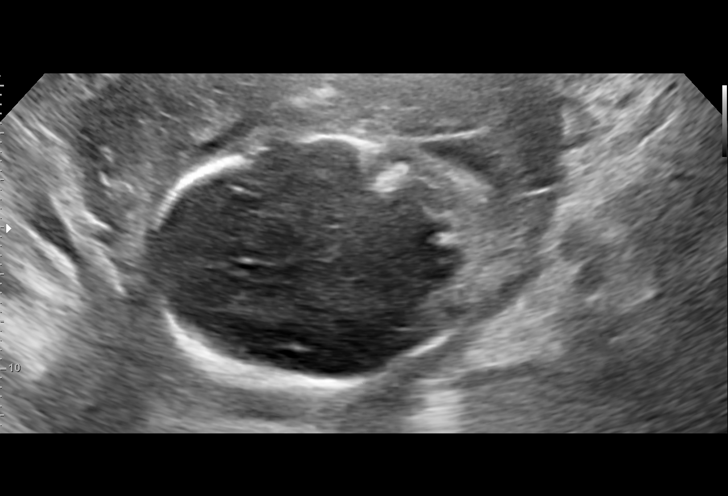
[im 58/79]
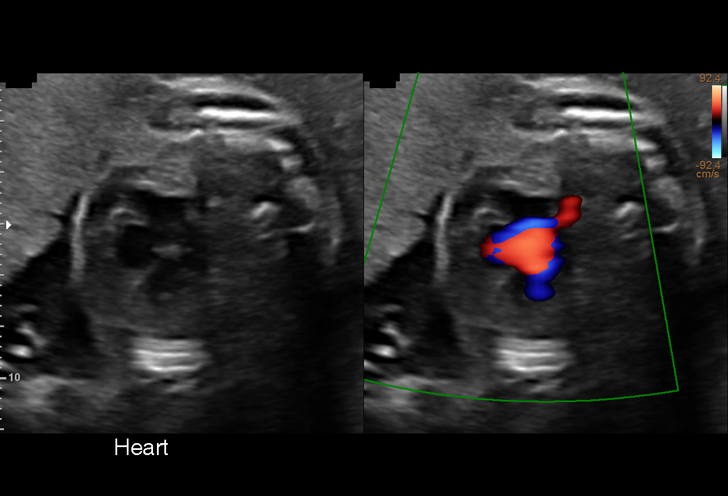
[im 64/79]
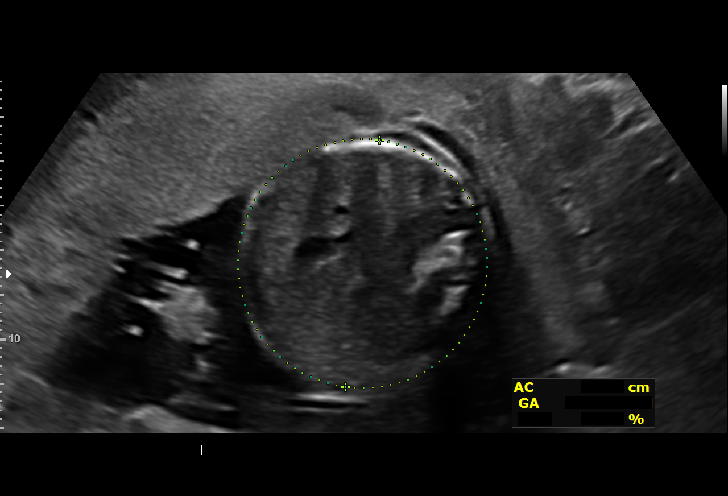
[im 70/79]
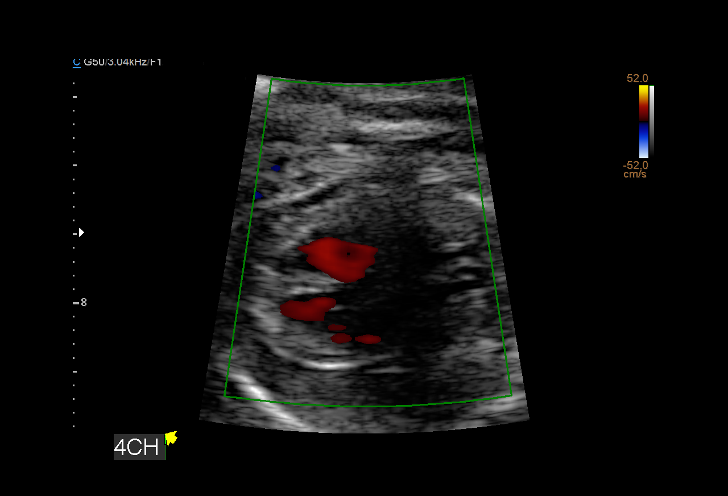
[im 76/79]
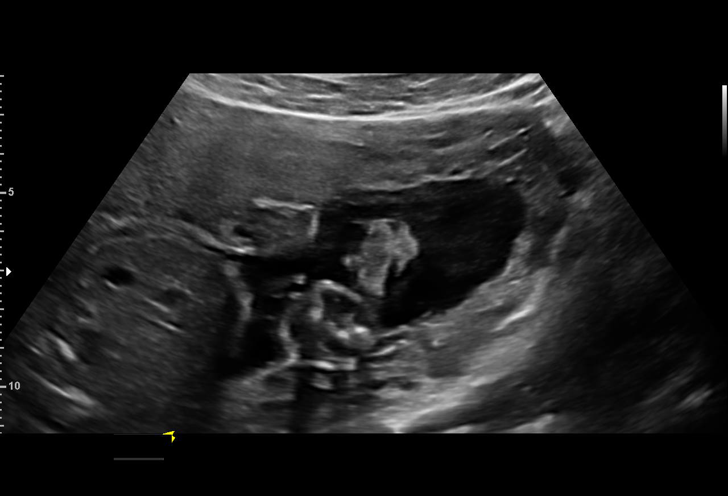

[13 of 28 positions shown; findings below may reference images not displayed]

#130

Indications

 Obesity complicating pregnancy, second
 trimester (pregravid BMI 65)
 Low risk NIPS, neg AFP
 23 weeks gestation of pregnancy
 Encounter for other antenatal screening
 follow-up
Fetal Evaluation

 Num Of Fetuses:         1
 Fetal Heart Rate(bpm):  160
 Cardiac Activity:       Observed
 Presentation:           Cephalic
 Placenta:               Anterior
 P. Cord Insertion:      Visualized

 Amniotic Fluid
 AFI FV:      Subjectively decreased

                             Largest Pocket(cm)

Biometry

 BPD:        55  mm     G. Age:  22w 5d         14  %    CI:        75.32   %    70 - 86
                                                         FL/HC:      18.2   %    18.7 -
 HC:       201   mm     G. Age:  22w 2d        2.2  %    HC/AC:      1.12        1.05 -
 AC:      179.3  mm     G. Age:  22w 6d         16  %    FL/BPD:     66.5   %    71 - 87
 FL:       36.6  mm     G. Age:  21w 4d        1.6  %    FL/AC:      20.4   %    20 - 24
 CER:      23.9  mm     G. Age:  22w 0d         22  %
 LV:        2.4  mm
 CM:        7.5  mm

 Est. FW:     492  gm      1 lb 1 oz    3.5  %
OB History

 Gravidity:    4         Term:   0        Prem:   0        SAB:   2
 TOP:          1       Ectopic:  0        Living: 0
Gestational Age

 LMP:           23w 5d        Date:  12/17/20                 EDD:   09/23/21
 U/S Today:     22w 3d                                        EDD:   10/02/21
 Best:          23w 5d     Det. By:  LMP  (12/17/20)          EDD:   09/23/21
Anatomy

 Cranium:               Appears normal         LVOT:                   Appears normal
 Cavum:                 Not well visualized    Aortic Arch:            Appears normal
 Ventricles:            Appears normal         Ductal Arch:            Not well visualized
 Choroid Plexus:        Appears normal         Diaphragm:              Appears normal
 Cerebellum:            Appears normal         Stomach:                Appears normal, left
                                                                       sided
 Posterior Fossa:       Appears normal         Abdomen:                Appears normal
 Nuchal Fold:           Not applicable (>20    Abdominal Wall:         Previously seen
                        wks GA)
 Face:                  Orbits nl; profile     Cord Vessels:           Previously seen
                        prev seen
 Lips:                  Appears normal         Kidneys:                Appear normal
 Palate:                Not well visualized    Bladder:                Appears normal
 Thoracic:              Not well visualized    Spine:                  Not well visualized
 Heart:                 Not well visualized    Upper Extremities:      Prev Visualized
 RVOT:                  Not well visualized    Lower Extremities:      Prev Visualized

 Other:  Left heel and right hand prev visualized. Nasal bone prev visualized.
         Lenses prev visualized. Technically difficult due to maternal habitus
         and fetal position.
Doppler - Fetal Vessels

 Umbilical Artery
  S/D     %tile      RI    %tile      PI    %tile            ADFV    RDFV
  3.33       40     0.7       44    1.03       29               No      No

Cervix Uterus Adnexa

 Cervix
 Length:            3.7  cm.
 Normal appearance by transabdominal scan.

 Uterus
 No abnormality visualized.

 Right Ovary
 Not visualized.
 Left Ovary
 Within normal limits.

 Cul De Sac
 No free fluid seen.

 Adnexa
 No adnexal mass visualized.
Impression

 Patient returned for completion of fetal anatomy.
 On today's ultrasound, the estimated fetal weight is at the 4th
 percentile.  Amniotic fluid is slightly decreased for this
 gestational age.  Fetal anatomical survey could still not be
 completed because of fetal position and maternal body
 habitus.  Umbilical artery Doppler showed normal forward
 diastolic flow.
 I explained the finding of fetal growth restriction that is difficult
 to differentiate from a constitutionally small fetus. Blood
 pressure today at her office is 135/91 mmHg.  Gestational
 hypertension/preeclampsia can be preceded by fetal growth
 restriction.
 Patient understands that there will be more frequent fetal
 monitoring in future.
 I recommended fetal echocardiography because of possibility
 of fetal growth restriction.
 As maternal obesity limits resolution of images, failure to
 detect anomalies are more common .
Recommendations

 -An appointment was made for her to return in 3 weeks for
 fetal growth assessment
 -We have requested an appointment for fetal
 echocardiography ([HOSPITAL]).
                 Jumper, Blain

## 2022-10-16 ENCOUNTER — Ambulatory Visit (HOSPITAL_COMMUNITY)
Admission: RE | Admit: 2022-10-16 | Discharge: 2022-10-16 | Disposition: A | Discharge status: Home / Self Care | Payer: Federal, State, Local not specified - PPO | Attending: Obstetrics and Gynecology | Admitting: Obstetrics and Gynecology

## 2022-10-16 ENCOUNTER — Other Ambulatory Visit: Payer: Self-pay

## 2022-10-16 ENCOUNTER — Encounter (HOSPITAL_COMMUNITY): Payer: Self-pay | Admitting: Obstetrics and Gynecology

## 2022-10-16 ENCOUNTER — Ambulatory Visit (HOSPITAL_COMMUNITY): Payer: Federal, State, Local not specified - PPO | Admitting: Anesthesiology

## 2022-10-16 ENCOUNTER — Encounter (HOSPITAL_COMMUNITY)
Admission: RE | Disposition: A | Discharge status: Home / Self Care | Payer: Self-pay | Source: Home / Self Care | Attending: Obstetrics and Gynecology

## 2022-10-16 DIAGNOSIS — N939 Abnormal uterine and vaginal bleeding, unspecified: Secondary | ICD-10-CM | POA: Insufficient documentation

## 2022-10-16 DIAGNOSIS — I10 Essential (primary) hypertension: Secondary | ICD-10-CM | POA: Diagnosis not present

## 2022-10-16 DIAGNOSIS — A6000 Herpesviral infection of urogenital system, unspecified: Secondary | ICD-10-CM | POA: Insufficient documentation

## 2022-10-16 DIAGNOSIS — R7303 Prediabetes: Secondary | ICD-10-CM | POA: Insufficient documentation

## 2022-10-16 DIAGNOSIS — N840 Polyp of corpus uteri: Secondary | ICD-10-CM | POA: Diagnosis not present

## 2022-10-16 HISTORY — PX: DILATATION & CURETTAGE/HYSTEROSCOPY WITH MYOSURE: SHX6511

## 2022-10-16 HISTORY — DX: Pneumonia, unspecified organism: J18.9

## 2022-10-16 HISTORY — DX: Other complications of anesthesia, initial encounter: T88.59XA

## 2022-10-16 HISTORY — DX: Prediabetes: R73.03

## 2022-10-16 LAB — POCT PREGNANCY, URINE: Preg Test, Ur: NEGATIVE

## 2022-10-16 LAB — BASIC METABOLIC PANEL
Anion gap: 11 (ref 5–15)
BUN: 15 mg/dL (ref 6–20)
CO2: 22 mmol/L (ref 22–32)
Calcium: 8.8 mg/dL — ABNORMAL LOW (ref 8.9–10.3)
Chloride: 105 mmol/L (ref 98–111)
Creatinine, Ser: 0.89 mg/dL (ref 0.44–1.00)
GFR, Estimated: >60 mL/min (ref >60)
Glucose, Bld: 115 mg/dL — ABNORMAL HIGH (ref 70–99)
Potassium: 3.8 mmol/L (ref 3.5–5.1)
Sodium: 138 mmol/L (ref 135–145)

## 2022-10-16 LAB — CBC
HCT: 39.3 % (ref 36.0–46.0)
Hemoglobin: 12.4 g/dL (ref 12.0–15.0)
MCH: 25.8 pg — ABNORMAL LOW (ref 26.0–34.0)
MCHC: 31.6 g/dL (ref 30.0–36.0)
MCV: 81.7 fL (ref 80.0–100.0)
Platelets: 330 10*3/uL (ref 150–400)
RBC: 4.81 MIL/uL (ref 3.87–5.11)
RDW: 14.6 % (ref 11.5–15.5)
WBC: 6.8 10*3/uL (ref 4.0–10.5)
nRBC: 0 % (ref 0.0–0.2)

## 2022-10-16 SURGERY — DILATATION & CURETTAGE/HYSTEROSCOPY WITH MYOSURE
Anesthesia: General | Site: Uterus

## 2022-10-16 MED ORDER — ONDANSETRON HCL 4 MG/2ML IJ SOLN
INTRAMUSCULAR | Status: AC
  Filled 2022-10-16: qty 2

## 2022-10-16 MED ORDER — DEXAMETHASONE SODIUM PHOSPHATE 10 MG/ML IJ SOLN
INTRAMUSCULAR | Status: AC
  Filled 2022-10-16: qty 1

## 2022-10-16 MED ORDER — PROPOFOL 10 MG/ML IV BOLUS
INTRAVENOUS | Status: DC | PRN
  Administered 2022-10-16: 200 mg via INTRAVENOUS

## 2022-10-16 MED ORDER — LIDOCAINE 2% (20 MG/ML) 5 ML SYRINGE
INTRAMUSCULAR | Status: DC | PRN
  Administered 2022-10-16: 100 mg via INTRAVENOUS

## 2022-10-16 MED ORDER — LIDOCAINE 2% (20 MG/ML) 5 ML SYRINGE
INTRAMUSCULAR | Status: AC
  Filled 2022-10-16: qty 5

## 2022-10-16 MED ORDER — ACETAMINOPHEN 500 MG PO TABS: 1000.0000 mg | ORAL_TABLET | Freq: Once | ORAL | Status: DC | PRN

## 2022-10-16 MED ORDER — OXYCODONE HCL 5 MG/5ML PO SOLN: 5.0000 mg | Freq: Once | ORAL | Status: AC | PRN

## 2022-10-16 MED ORDER — MIDAZOLAM HCL 2 MG/2ML IJ SOLN
INTRAMUSCULAR | Status: AC
  Filled 2022-10-16: qty 2

## 2022-10-16 MED ORDER — OXYCODONE HCL 5 MG PO TABS
5.0000 mg | ORAL_TABLET | Freq: Once | ORAL | Status: AC | PRN
  Administered 2022-10-16: 5 mg via ORAL

## 2022-10-16 MED ORDER — FENTANYL CITRATE (PF) 100 MCG/2ML IJ SOLN: 25.0000 ug | INTRAMUSCULAR | Status: DC | PRN

## 2022-10-16 MED ORDER — FENTANYL CITRATE (PF) 250 MCG/5ML IJ SOLN
INTRAMUSCULAR | Status: AC
  Filled 2022-10-16: qty 5

## 2022-10-16 MED ORDER — OXYCODONE HCL 5 MG PO TABS
ORAL_TABLET | ORAL | Status: AC
  Filled 2022-10-16: qty 1

## 2022-10-16 MED ORDER — ACETAMINOPHEN 160 MG/5ML PO SOLN: 1000.0000 mg | Freq: Once | ORAL | Status: DC | PRN

## 2022-10-16 MED ORDER — ACETAMINOPHEN 10 MG/ML IV SOLN: 1000.0000 mg | Freq: Once | INTRAVENOUS | Status: DC | PRN

## 2022-10-16 MED ORDER — TRANEXAMIC ACID-NACL 1000-0.7 MG/100ML-% IV SOLN
INTRAVENOUS | Status: AC
  Filled 2022-10-16: qty 100

## 2022-10-16 MED ORDER — SUGAMMADEX SODIUM 200 MG/2ML IV SOLN
INTRAVENOUS | Status: DC | PRN
  Administered 2022-10-16: 200 mg via INTRAVENOUS

## 2022-10-16 MED ORDER — PROPOFOL 10 MG/ML IV BOLUS
INTRAVENOUS | Status: AC
  Filled 2022-10-16: qty 20

## 2022-10-16 MED ORDER — LIDOCAINE HCL 1 % IJ SOLN
INTRAMUSCULAR | Status: DC | PRN
  Administered 2022-10-16: 10 mL

## 2022-10-16 MED ORDER — ONDANSETRON HCL 4 MG/2ML IJ SOLN
INTRAMUSCULAR | Status: DC | PRN
  Administered 2022-10-16: 4 mg via INTRAVENOUS

## 2022-10-16 MED ORDER — LIDOCAINE HCL (PF) 1 % IJ SOLN
INTRAMUSCULAR | Status: AC
  Filled 2022-10-16: qty 30

## 2022-10-16 MED ORDER — SUCCINYLCHOLINE CHLORIDE 200 MG/10ML IV SOSY
PREFILLED_SYRINGE | INTRAVENOUS | Status: AC
  Filled 2022-10-16: qty 10

## 2022-10-16 MED ORDER — ORAL CARE MOUTH RINSE: 15.0000 mL | Freq: Once | OROMUCOSAL | Status: AC

## 2022-10-16 MED ORDER — KETOROLAC TROMETHAMINE 30 MG/ML IJ SOLN
INTRAMUSCULAR | Status: AC
  Filled 2022-10-16: qty 1

## 2022-10-16 MED ORDER — KETOROLAC TROMETHAMINE 30 MG/ML IJ SOLN
30.0000 mg | Freq: Once | INTRAMUSCULAR | Status: AC
  Administered 2022-10-16: 30 mg via INTRAVENOUS

## 2022-10-16 MED ORDER — IBUPROFEN 600 MG PO TABS: 600.0000 mg | ORAL_TABLET | Freq: Four times a day (QID) | ORAL | 0 refills | Status: AC | PRN

## 2022-10-16 MED ORDER — POVIDONE-IODINE 10 % EX SWAB
2.0000 | Freq: Once | CUTANEOUS | Status: AC
  Administered 2022-10-16: 2 via TOPICAL

## 2022-10-16 MED ORDER — LACTATED RINGERS IV SOLN: INTRAVENOUS | Status: DC

## 2022-10-16 MED ORDER — DEXAMETHASONE SODIUM PHOSPHATE 4 MG/ML IJ SOLN
INTRAMUSCULAR | Status: DC | PRN
  Administered 2022-10-16: 10 mg via INTRAVENOUS

## 2022-10-16 MED ORDER — TRANEXAMIC ACID-NACL 1000-0.7 MG/100ML-% IV SOLN
INTRAVENOUS | Status: DC | PRN
  Administered 2022-10-16: 1000 mg via INTRAVENOUS

## 2022-10-16 MED ORDER — ROCURONIUM BROMIDE 10 MG/ML (PF) SYRINGE
PREFILLED_SYRINGE | INTRAVENOUS | Status: AC
  Filled 2022-10-16: qty 10

## 2022-10-16 MED ORDER — FENTANYL CITRATE (PF) 100 MCG/2ML IJ SOLN
INTRAMUSCULAR | Status: DC | PRN
  Administered 2022-10-16: 100 ug via INTRAVENOUS

## 2022-10-16 MED ORDER — MIDAZOLAM HCL 5 MG/5ML IJ SOLN
INTRAMUSCULAR | Status: DC | PRN
  Administered 2022-10-16: 2 mg via INTRAVENOUS

## 2022-10-16 MED ORDER — ROCURONIUM BROMIDE 10 MG/ML (PF) SYRINGE
PREFILLED_SYRINGE | INTRAVENOUS | Status: DC | PRN
  Administered 2022-10-16: 30 mg via INTRAVENOUS

## 2022-10-16 MED ORDER — CHLORHEXIDINE GLUCONATE 0.12 % MT SOLN
15.0000 mL | Freq: Once | OROMUCOSAL | Status: AC
  Administered 2022-10-16: 15 mL via OROMUCOSAL
  Filled 2022-10-16: qty 15

## 2022-10-16 MED ORDER — SUCCINYLCHOLINE CHLORIDE 200 MG/10ML IV SOSY
PREFILLED_SYRINGE | INTRAVENOUS | Status: DC | PRN
  Administered 2022-10-16: 180 mg via INTRAVENOUS

## 2022-10-16 MED ORDER — FERROUS SULFATE 325 (65 FE) MG PO TABS: 325.0000 mg | ORAL_TABLET | Freq: Every day | ORAL | 11 refills | Status: AC

## 2022-10-16 MED ORDER — SODIUM CHLORIDE 0.9 % IR SOLN
Status: DC | PRN
  Administered 2022-10-16 (×2): 3000 mL

## 2022-10-16 SURGICAL SUPPLY — 23 items
BIPOLAR CUTTING LOOP 21FR (ELECTRODE)
CATH ROBINSON RED A/P 16FR (CATHETERS) ×1 IMPLANT
DEVICE MYOSURE LITE (MISCELLANEOUS) IMPLANT
DEVICE MYOSURE REACH (MISCELLANEOUS) IMPLANT
DILATOR CANAL MILEX (MISCELLANEOUS) IMPLANT
DRSG TELFA 3X8 NADH STRL (GAUZE/BANDAGES/DRESSINGS) ×1 IMPLANT
GAUZE 4X4 16PLY ~~LOC~~+RFID DBL (SPONGE) ×2 IMPLANT
GLOVE BIO SURGEON STRL SZ 6.5 (GLOVE) ×1 IMPLANT
GLOVE BIOGEL PI IND STRL 7.0 (GLOVE) ×1 IMPLANT
GOWN STRL REUS W/TWL LRG LVL3 (GOWN DISPOSABLE) ×1 IMPLANT
KIT PROCEDURE FLUENT (KITS) ×1 IMPLANT
KIT TURNOVER KIT A (KITS) ×1 IMPLANT
LOOP CUTTING BIPOLAR 21FR (ELECTRODE) IMPLANT
MAT PREVALON FULL STRYKER (MISCELLANEOUS) IMPLANT
MYOSURE XL FIBROID (MISCELLANEOUS)
PACK VAGINAL MINOR WOMEN LF (CUSTOM PROCEDURE TRAY) ×1 IMPLANT
PAD OB MATERNITY 4.3X12.25 (PERSONAL CARE ITEMS) ×1 IMPLANT
PAD PREP 24X48 CUFFED NSTRL (MISCELLANEOUS) ×1 IMPLANT
SEAL ROD LENS SCOPE MYOSURE (ABLATOR) ×1 IMPLANT
SYR 20ML LL LF (SYRINGE) IMPLANT
SYSTEM TISS REMOVAL MYOSURE XL (MISCELLANEOUS) IMPLANT
TOWEL OR 17X26 10 PK STRL BLUE (TOWEL DISPOSABLE) ×2 IMPLANT
WATER STERILE IRR 500ML POUR (IV SOLUTION) ×1 IMPLANT

## 2022-10-16 NOTE — Op Note (Signed)
Pre-operative Diagnosis: Abnormal uterine bleeding Endometrium thickened Morbid obesity Trying to conceive Pre-diabetes Genital herpes simplex  Post-operative Diagnosis: same  Surgeon: Surgeon(s) and Role:    * Josefine Class, MD - Primary   Procedure and Anesthesia:  Procedure(s) and Anesthesia Type:    * DILATATION & CURETTAGE/HYSTEROSCOPY WITH MYOSURE - General  ASA Class: 3  ASSISTANTS: none   ANESTHESIA:   General and paracervical block  ESTIMATED BLOOD LOSS: 50 cc  LOCAL MEDICATIONS USED:  1% xylocaine 10 cc  SPECIMEN:  Source of Specimen:  endometrial curettings  DISPOSITION OF SPECIMEN:  PATHOLOGY  COUNTS Correct:  YES   DICTATION: The patient was taken to the operating room and prepped and draped in a normal sterile fashion. An in out catheter was used to drain the bladder.   A weighted speculum was placed into the vagina and anterior lip of the cervix was grasped with a single-tooth tenaculum.  10 cc of 1% xylocaine was used for cervical block. The uterus sounded to 36mm. The cervix was then dilated with Pratt dilators up to 19. The Myosure hysteroscope was placed into the uterine cavity. The  entire uterus and both ostia were visualized. Endometrium was noted to be thickened and irregular with no active bleeding. Fundal polyp noted in 2:00 position in front of left tubal ostia. Myosure lite device placed in hysteroscopy and curettage was performed. Fluid deficit was 670 mL. Hysteroscope was then removed from the uterus. A sharp curettage was then done with a curette. All the endometrial curettings were sent to pathology. The hysteroscope was not reintroduced. EBL was 50 cc and TXA administered intra-op. The tenaculum was removed from the cervix and hemostasis was noted.    PLAN OF CARE: discharge to home  PATIENT DISPOSITION:  PACU - hemodynamically stable.   Dr. Bing Matter

## 2022-10-16 NOTE — Anesthesia Procedure Notes (Signed)
Procedure Name: Intubation Date/Time: 10/16/2022 7:44 AM  Performed by: Lieutenant Diego, CRNAPre-anesthesia Checklist: Patient identified, Emergency Drugs available, Suction available and Patient being monitored Patient Re-evaluated:Patient Re-evaluated prior to induction Oxygen Delivery Method: Circle system utilized Preoxygenation: Pre-oxygenation with 100% oxygen Induction Type: IV induction Ventilation: Mask ventilation without difficulty Laryngoscope Size: Miller and 2 Grade View: Grade I Tube type: Oral Tube size: 7.0 mm Number of attempts: 1 Airway Equipment and Method: Stylet Placement Confirmation: ETT inserted through vocal cords under direct vision, positive ETCO2 and breath sounds checked- equal and bilateral Secured at: 22 cm Tube secured with: Tape Dental Injury: Teeth and Oropharynx as per pre-operative assessment

## 2022-10-16 NOTE — Transfer of Care (Signed)
Immediate Anesthesia Transfer of Care Note  Patient: AYDE RECORD  Procedure(s) Performed: DILATATION & CURETTAGE/HYSTEROSCOPY WITH MYOSURE (Uterus)  Patient Location: PACU  Anesthesia Type:General  Level of Consciousness: awake  Airway & Oxygen Therapy: Patient Spontanous Breathing and Patient connected to face mask oxygen  Post-op Assessment: Report given to RN and Post -op Vital signs reviewed and stable  Post vital signs: Reviewed and stable  Last Vitals:  Vitals Value Taken Time  BP 152/93 10/16/22 0830  Temp    Pulse 78 10/16/22 0830  Resp 22 10/16/22 0830  SpO2 98 % 10/16/22 0830  Vitals shown include unvalidated device data.  Last Pain:  Vitals:   10/16/22 0645  TempSrc: Oral  PainSc:          Complications: No notable events documented.

## 2022-10-16 NOTE — Anesthesia Preprocedure Evaluation (Signed)
Anesthesia Evaluation  Patient identified by MRN, date of birth, ID band Patient awake    Reviewed: Allergy & Precautions, NPO status , Patient's Chart, lab work & pertinent test results  History of Anesthesia Complications Negative for: history of anesthetic complications  Airway Mallampati: IV  TM Distance: >3 FB Neck ROM: Full    Dental  (+) Teeth Intact, Dental Advisory Given   Pulmonary neg shortness of breath, neg COPD, neg recent URI   breath sounds clear to auscultation       Cardiovascular hypertension, (-) angina (-) Past MI  Rhythm:Regular     Neuro/Psych  Headaches  negative psych ROS   GI/Hepatic negative GI ROS, Neg liver ROS,,,  Endo/Other  diabetes  Morbid obesity  Renal/GU negative Renal ROS     Musculoskeletal negative musculoskeletal ROS (+)    Abdominal   Peds  Hematology negative hematology ROS (+) Lab Results      Component                Value               Date                      WBC                      6.8                 10/16/2022                HGB                      12.4                10/16/2022                HCT                      39.3                10/16/2022                MCV                      81.7                10/16/2022                PLT                      330                 10/16/2022              Anesthesia Other Findings   Reproductive/Obstetrics Lab Results      Component                Value               Date                      PREGTESTUR               NEGATIVE            10/16/2022                PREGSERUM  POSITIVE (A)        07/26/2019                                        Anesthesia Physical Anesthesia Plan  ASA: 3  Anesthesia Plan: General   Post-op Pain Management: Toradol IV (intra-op)* and Ofirmev IV (intra-op)*   Induction: Intravenous and Rapid sequence  PONV Risk Score and Plan: 3 and  Ondansetron and Dexamethasone  Airway Management Planned: Oral ETT  Additional Equipment: None  Intra-op Plan:   Post-operative Plan: Extubation in OR  Informed Consent: I have reviewed the patients History and Physical, chart, labs and discussed the procedure including the risks, benefits and alternatives for the proposed anesthesia with the patient or authorized representative who has indicated his/her understanding and acceptance.     Dental advisory given  Plan Discussed with: CRNA  Anesthesia Plan Comments:        Anesthesia Quick Evaluation

## 2022-10-16 NOTE — H&P (Signed)
Shannon Francis is an 31 y.o. female. Patient has been having abnormal periods since her delivery (c-section) in October 2022. She was originally seen in March to April of this year for work-up and was negative at that time. Ultrasound in March did demonstrate thickened endometrial cavity of 2 cm. Patient was given a course of progesterone and her bleeding stabilized. She did not have a period for a few months and then began having menstrual periods again and they were regular until 08/16/2022 in which she has been having periods of intermittent heavy and light bleeding she will go 1 day without bleeding and thinks that her period is brought in before it starts heavy again the next day. Labs wnl. Pt. not candidate for OCPs, IUD, Depo or other hormonal birth control as she desires pregnancy. Discussed option of repeat course of provera (completed in 02/2022) vs. d/c hysteroscopy to sample lining, remove polyps if present to increase chance of pregnancy, and reset endometrial lining. She had one in the past with removal of endometrial polyps. R/B/A discussed including but not limited to infection, bleeding, damage to surrounding structures, uterine perforations. All question answered to pt. satisfaction. Pt. wishes to proceed. Pt. [place] (10/6); Uterus 9.1 x 6.4 x 4.5 cm, Endometrial lining 15.68mm, RO 2.98 x 2.3 x 1.6 cm, LO 2.8 x 2.2 x 1.6 cm.     Past Medical History:  Diagnosis Date   Complication of anesthesia    after a d/c nose bleed and htey had to get the bleeding to stop before they took out the intubation tube.   Headache    Morbid obesity ([place])    Pneumonia    Pre-diabetes     Past Surgical History:  Procedure Laterality Date   ANTERIOR CRUCIATE LIGAMENT REPAIR     left   CESAREAN SECTION N/A 09/10/2021   Procedure: CESAREAN SECTION;  Surgeon: Everett Graff, MD;  Location: [facility];  Service: Obstetrics;  Laterality: N/A;   DILATATION & CURETTAGE/HYSTEROSCOPY WITH MYOSURE N/A 08/13/2018    Procedure: DILATATION & CURETTAGE/HYSTEROSCOPY WITH MYOSURE;  Surgeon: Brien Few, MD;  Location: [facility];  Service: Gynecology;  Laterality: N/A;   INCISION AND DRAINAGE     chest   KNEE SURGERY      Family History  Problem Relation Age of Onset   Hypertension Mother    Diabetes Mother    Stroke Mother    Cancer Sister    Diabetes Other    Healthy Father     Social History:  reports that she has never smoked. She has never used smokeless tobacco. She reports that she does not drink alcohol and does not use drugs.  Allergies:  Allergies  Allergen Reactions   Hydrocodone Nausea And Vomiting and Other (See Comments)    dizziness    Medications Prior to Admission  Medication Sig Dispense Refill Last Dose   furosemide (LASIX) 20 MG tablet Take 20 mg by mouth daily.   Past Week   albuterol (VENTOLIN HFA) 108 (90 Base) MCG/ACT inhaler Inhale 2 puffs into the lungs every 6 (six) hours as needed for wheezing or shortness of breath.   More than a month   aspirin-acetaminophen-caffeine (EXCEDRIN MIGRAINE) 250-250-65 MG tablet Take 2 tablets by mouth every 8 (eight) hours as needed for headache or migraine.   More than a month   ibuprofen (ADVIL) 600 MG tablet Take 600 mg by mouth every 8 (eight) hours as needed for moderate pain.   More than a month  Review of Systems  Constitutional:        Pertinent ROS noted in HPI  All other systems reviewed and are negative.   Blood pressure (!) 144/81, pulse 71, temperature 98.2 F (36.8 C), temperature source Oral, resp. rate 18, height 5\' 5"  (1.651 m), weight (!) 163.3 kg, last menstrual period 09/10/2022, SpO2 94 %, currently breastfeeding. Physical Exam Vitals reviewed.  Constitutional:      Appearance: She is obese.  Cardiovascular:     Rate and Rhythm: Normal rate.  Pulmonary:     Effort: Pulmonary effort is normal. No respiratory distress.  Abdominal:     General: There is no distension.     Palpations: Abdomen is soft.      Tenderness: There is no abdominal tenderness.  Neurological:     General: No focal deficit present.     Mental Status: She is oriented to person, place, and time.  Psychiatric:        Mood and Affect: Mood normal.     Results for orders placed or performed during the hospital encounter of 10/16/22 (from the past 24 hour(s))  Basic metabolic panel per protocol     Status: Abnormal   Collection Time: 10/16/22  6:12 AM  Result Value Ref Range   Sodium 138 135 - 145 mmol/L   Potassium 3.8 3.5 - 5.1 mmol/L   Chloride 105 98 - 111 mmol/L   CO2 22 22 - 32 mmol/L   Glucose, Bld 115 (H) 70 - 99 mg/dL   BUN 15 6 - 20 mg/dL   Creatinine, Ser 0.89 0.44 - 1.00 mg/dL   Calcium 8.8 (L) 8.9 - 10.3 mg/dL   GFR, Estimated >60 >60 mL/min   Anion gap 11 5 - 15  CBC per protocol     Status: Abnormal   Collection Time: 10/16/22  6:12 AM  Result Value Ref Range   WBC 6.8 4.0 - 10.5 K/uL   RBC 4.81 3.87 - 5.11 MIL/uL   Hemoglobin 12.4 12.0 - 15.0 g/dL   HCT 39.3 36.0 - 46.0 %   MCV 81.7 80.0 - 100.0 fL   MCH 25.8 (L) 26.0 - 34.0 pg   MCHC 31.6 30.0 - 36.0 g/dL   RDW 14.6 11.5 - 15.5 %   Platelets 330 150 - 400 K/uL   nRBC 0.0 0.0 - 0.2 %  Pregnancy, urine POC     Status: None   Collection Time: 10/16/22  6:29 AM  Result Value Ref Range   Preg Test, Ur NEGATIVE NEGATIVE    No results found.  Assessment/Plan: 31yo G1P0 presenting for D&C hysteroscopy with myosure Morbid obesity Abnormal uterine bleeding Endometrium thickened Trying to conceive Pre-diabetes Genital herpes simplex  Plan to proceed with D&C hysteroscopy with myosure to sample lining, remove polyps if present to increase chance of pregnancy, and reset endometrial lining. She had one in the past with removal of endometrial polyps. R/B/A discussed including but not limited to infection, bleeding, damage to surrounding structures, uterine perforations. All question answered to pt. satisfaction. Pt. wishes to proceed.   Josefine Class 10/16/2022, 7:10 AM

## 2022-10-17 ENCOUNTER — Encounter (HOSPITAL_COMMUNITY): Payer: Self-pay | Admitting: Obstetrics and Gynecology

## 2022-10-17 LAB — SURGICAL PATHOLOGY

## 2022-10-17 NOTE — Anesthesia Postprocedure Evaluation (Signed)
Anesthesia Post Note  Patient: Shannon Francis  Procedure(s) Performed: DILATATION & CURETTAGE/HYSTEROSCOPY WITH MYOSURE (Uterus)     Patient location during evaluation: PACU Anesthesia Type: General Level of consciousness: awake and alert Pain management: pain level controlled Vital Signs Assessment: post-procedure vital signs reviewed and stable Respiratory status: spontaneous breathing, nonlabored ventilation, respiratory function stable and patient connected to nasal cannula oxygen Cardiovascular status: blood pressure returned to baseline and stable Postop Assessment: no apparent nausea or vomiting Anesthetic complications: no   No notable events documented.  Last Vitals:  Vitals:   10/16/22 0845 10/16/22 0900  BP: 131/81 (!) 139/94  Pulse: 76 72  Resp: 17 18  Temp:  36.9 C  SpO2: 90% 91%    Last Pain:  Vitals:   10/16/22 0900  TempSrc:   PainSc: 3                  Kimmi Acocella

## 2022-11-24 IMAGING — US US MFM OB FOLLOW-UP
1 series · 14 of 28 positions shown · non-contrast
Comparison: none

[Series 1: us mfm ob follow-up · 14 of 33 slices shown]
[im 2/33]
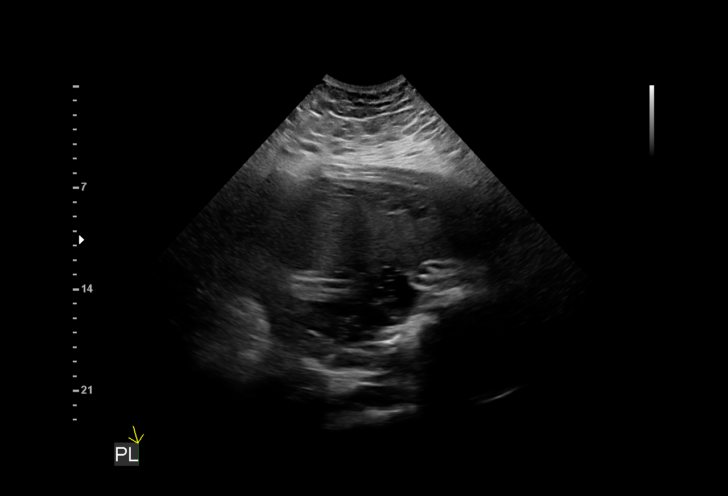
[im 4/33]
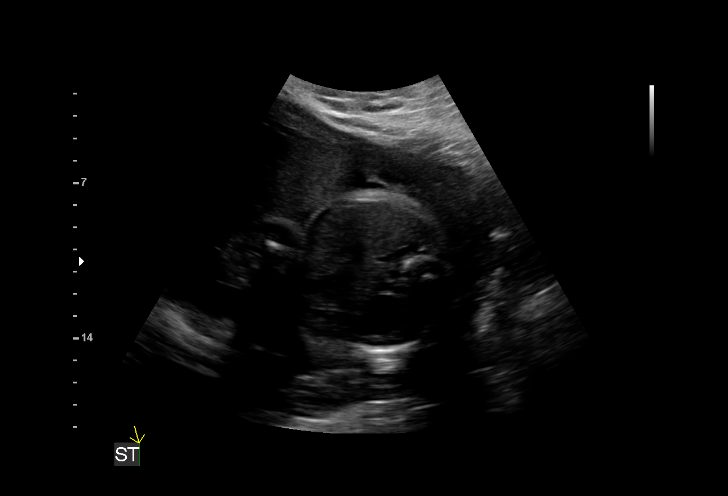
[im 6/33]
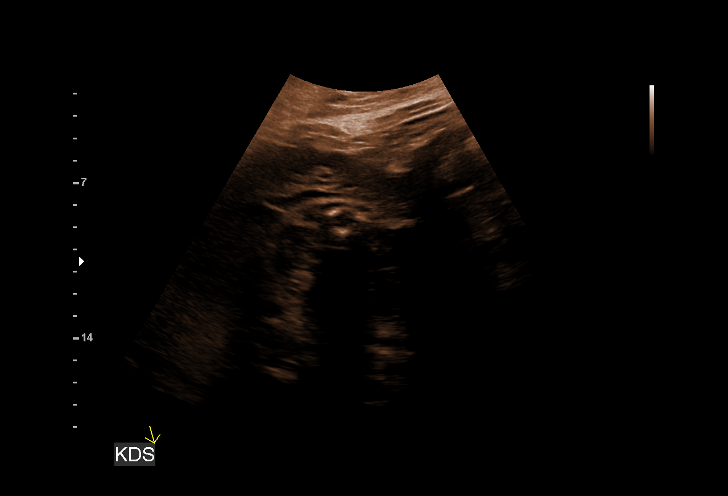
[im 9/33]
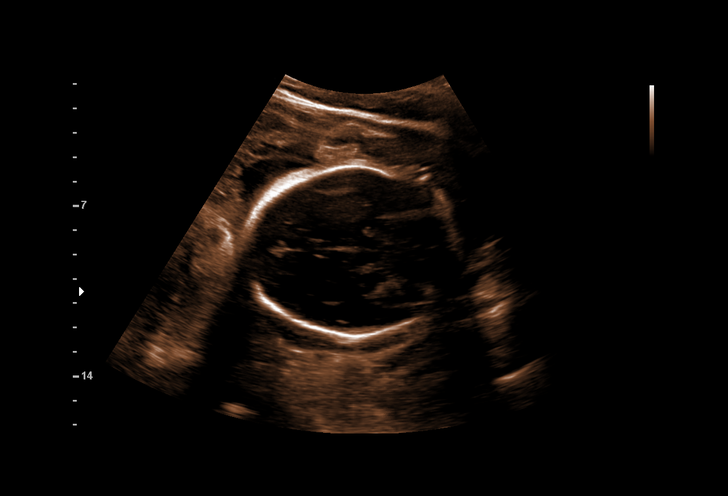
[im 11/33]
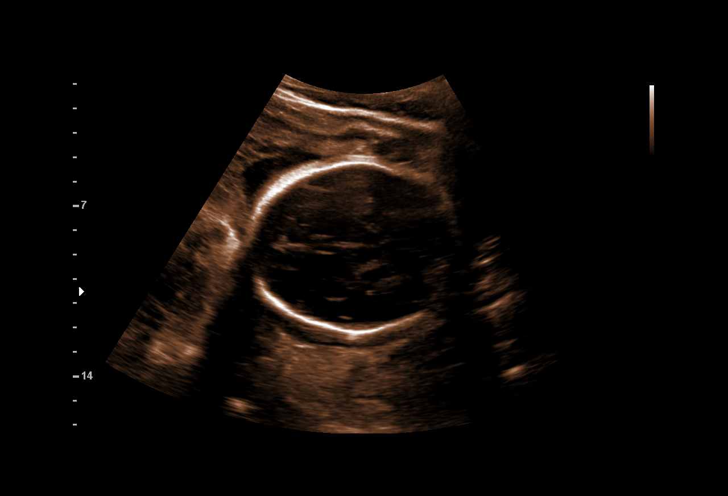
[im 14/33]
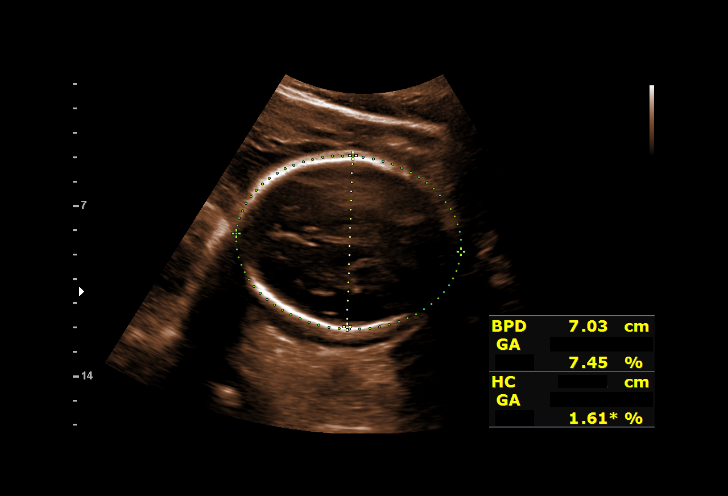
[im 16/33]
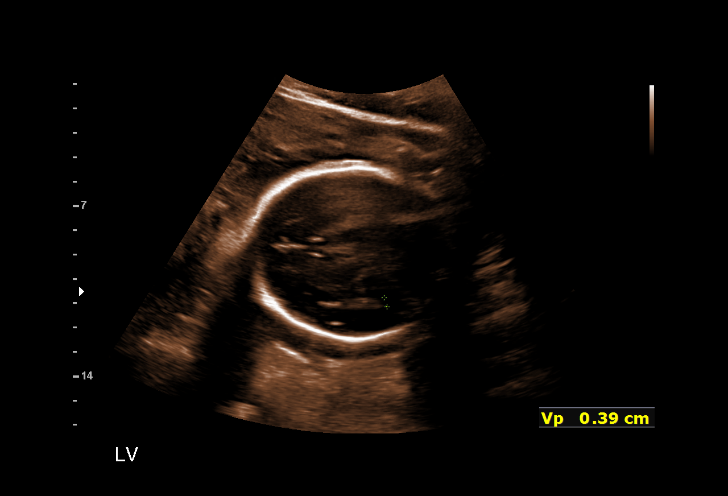
[im 18/33]
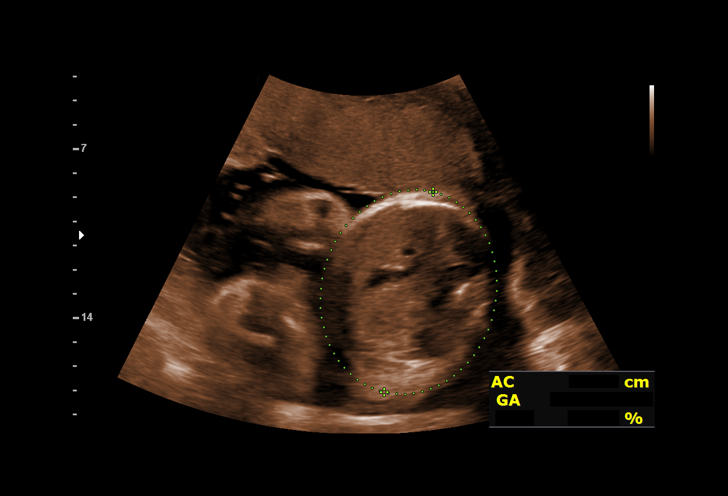
[im 21/33]
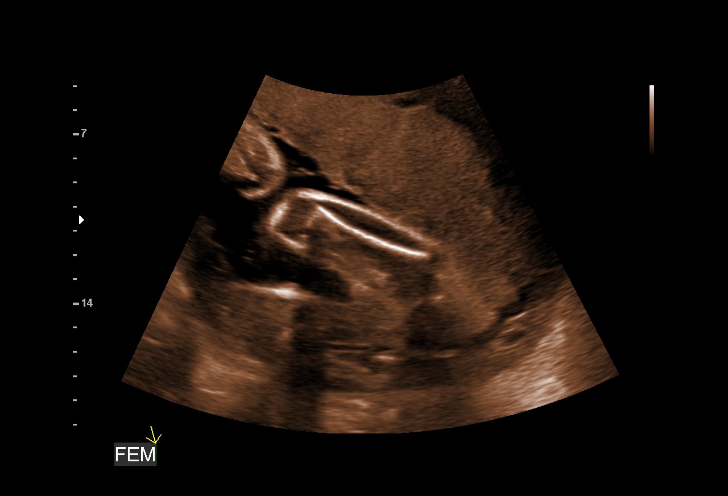
[im 23/33]
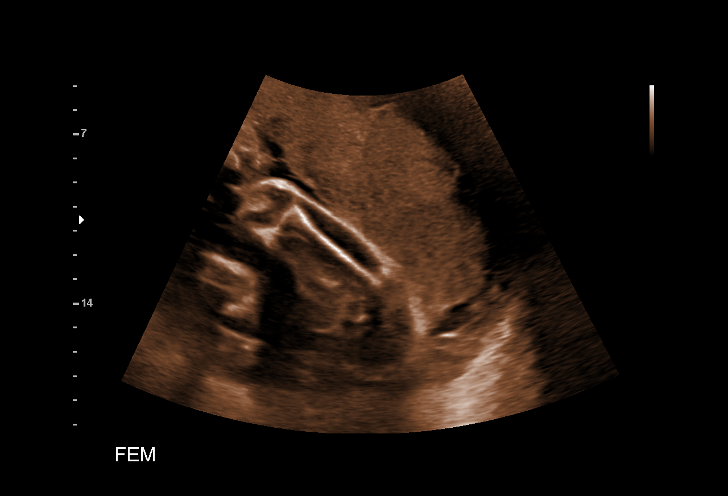
[im 25/33]
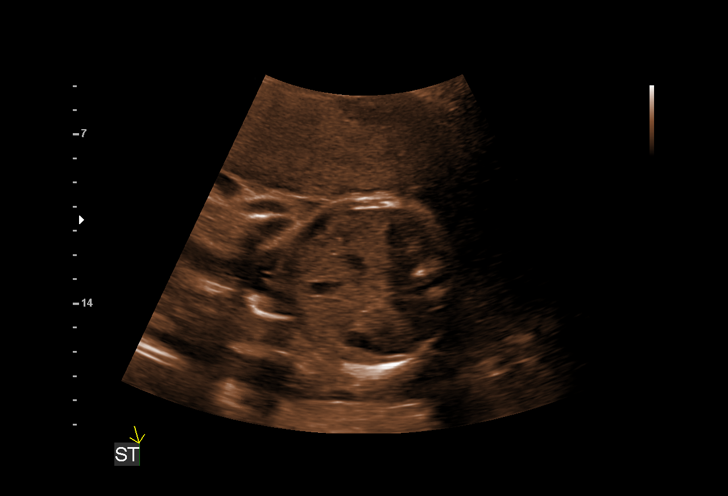
[im 28/33]
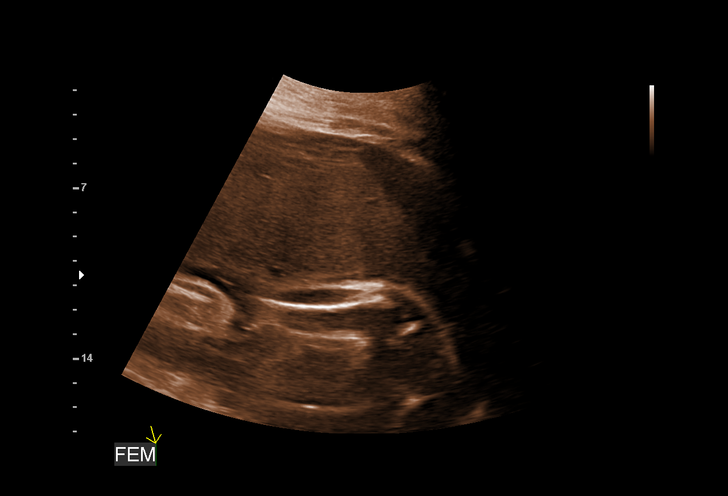
[im 30/33]
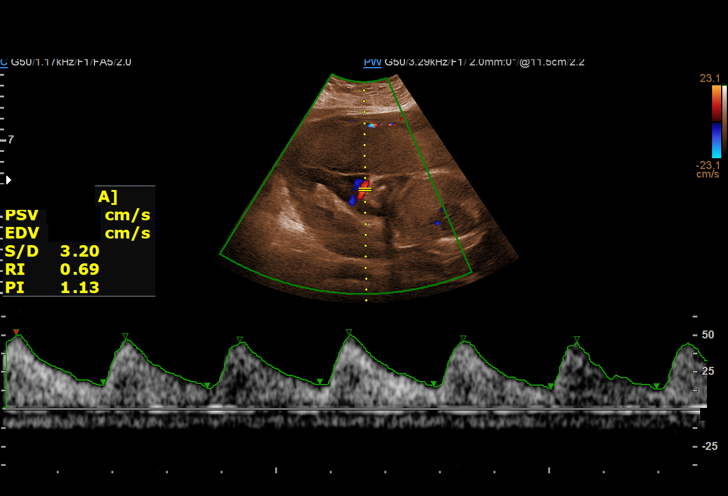
[im 33/33]
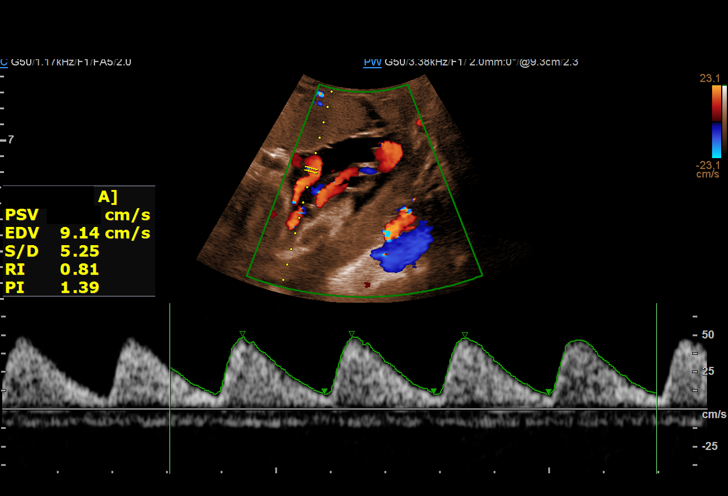

[14 of 28 positions shown; findings below may reference images not displayed]

#130

 1  US MFM UA CORD DOPPLER                76820.02    TELLO
                                                      MELISA F
                                                      MELISA F

Indications

 Maternal care for known or suspected poor
 fetal growth, third trimester, not applicable or
 unspecified IUGR
 29 weeks gestation of pregnancy
 Obesity complicating pregnancy (pregravid
 BMI 65)
 Encounter for other antenatal screening
 follow-up
 Low risk NIPS, neg AFP
Fetal Evaluation

 Num Of Fetuses:         1
 Preg. Location:         Intrauterine
 Fetal Heart Rate(bpm):  145
 Cardiac Activity:       Observed
 Presentation:           Cephalic
 Placenta:               Anterior

 Amniotic Fluid
 AFI FV:      Within normal limits

 AFI Sum(cm)     %Tile       Largest Pocket(cm)
 11.29           23
 RUQ(cm)       RLQ(cm)       LUQ(cm)        LLQ(cm)

Biometry

 BPD:        70  mm     G. Age:  28w 1d          6  %    CI:        74.91   %    70 - 86
                                                         FL/HC:      19.9   %    19.2 -
 HC:      256.6  mm     G. Age:  27w 6d        < 1  %    HC/AC:      1.03        0.99 -
 AC:      249.9  mm     G. Age:  29w 1d         33  %    FL/BPD:     73.0   %    71 - 87
 FL:       51.1  mm     G. Age:  27w 3d        1.7  %    FL/AC:      20.4   %    20 - 24
 HUM:      47.3  mm     G. Age:  27w 6d         13  %

 LV:        3.9  mm

 Est. FW:    1334  gm    2 lb 11 oz       8  %
OB History

 Gravidity:    4         Term:   0        Prem:   0        SAB:   2
 TOP:          1       Ectopic:  0        Living: 0
Gestational Age

 LMP:           29w 4d        Date:  12/17/20                 EDD:   09/23/21
 U/S Today:     28w 1d                                        EDD:   10/03/21
 Best:          29w 4d     Det. By:  LMP  (12/17/20)          EDD:   09/23/21
Anatomy

 Cranium:               Appears normal         Kidneys:                Appear normal
 Cavum:                 Appears normal         Bladder:                Appears normal
 Ventricles:            Appears normal         Upper Extremities:      Visualized
 Heart:                 Limited views          Lower Extremities:      Visualized
 Stomach:               Appears normal, left
                        sided
Doppler - Fetal Vessels

 Umbilical Artery
  S/D     %tile      RI    %tile      PI    %tile            ADFV    RDFV
  3.61       83    0.72       83    1.13       80               No      No

Cervix Uterus Adnexa

 Cervix
 Not adaquately visualized

 Right Ovary
 Not visualized.

 Left Ovary
 Not visualized.
Comments

 This patient was seen for a follow up growth scan due to fetal
 growth restriction noted during her prior ultrasound exams.
 She denies any problems since her last exam and reports
 feeling vigorous fetal movements throughout the day.
 On today's exam, the EFW measures at the 8th percentile for
 her gestational age indicating fetal growth restriction.  The
 fetus has grown close to one pound over the past 3 weeks.
 There was normal amniotic fluid noted.
 The patient had a reactive NST for her gestational age today.
 Doppler studies of the umbilical arteries showed a normal
 S/D ratio of 3.61.  There were no signs of absent or reversed
 end-diastolic flow.
 Due to fetal growth restriction, we will continue to follow her
 with weekly fetal testing and umbilical artery Doppler studies.
 Another umbilical artery Doppler study and NST was
 scheduled in 1 week.

## 2022-12-10 IMAGING — US US MFM UA CORD DOPPLER
1 series · 15 of 28 positions shown · non-contrast
Comparison: none

[Series 1: us mfm ua cord doppler · 36 acquisitions, 15 frames shown]
[im 1/36]
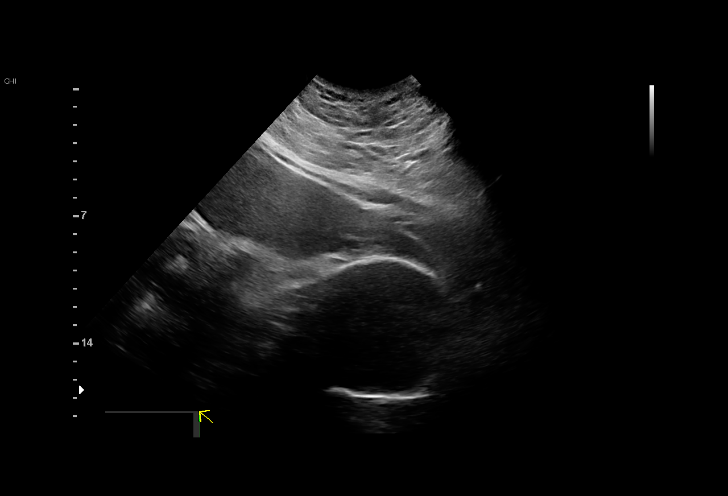
[im 3/36]
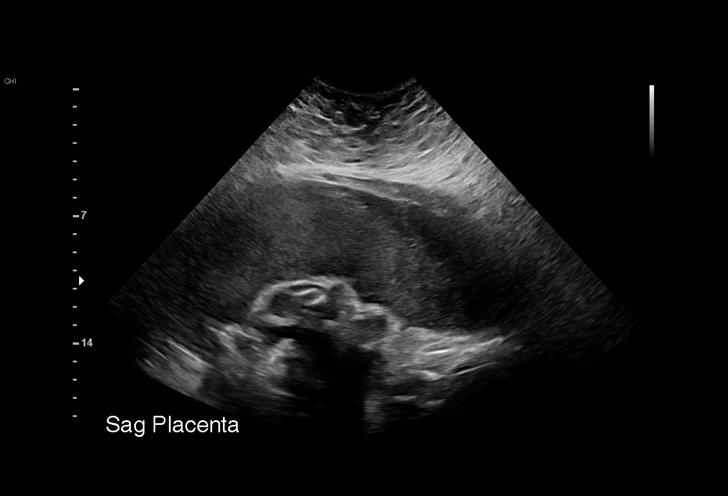
[im 6/36]
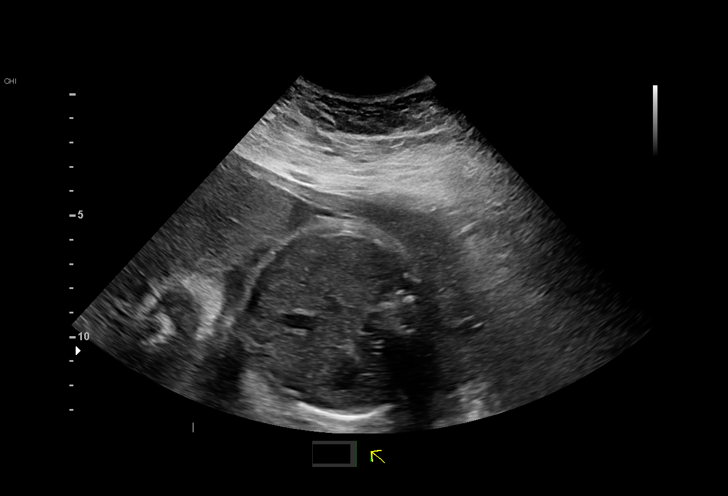
[im 8/36]
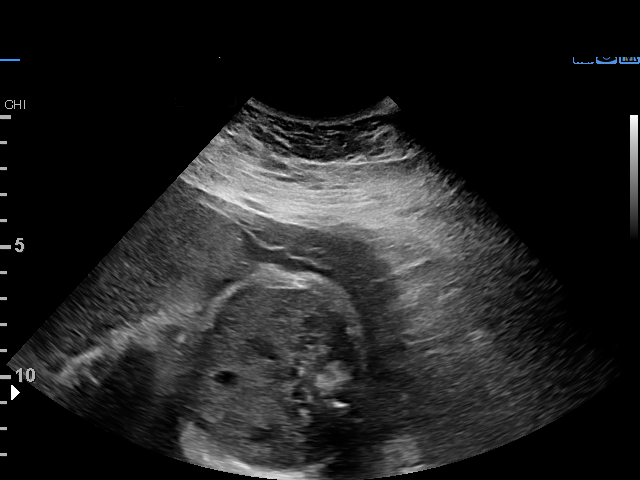
[im 11/36]
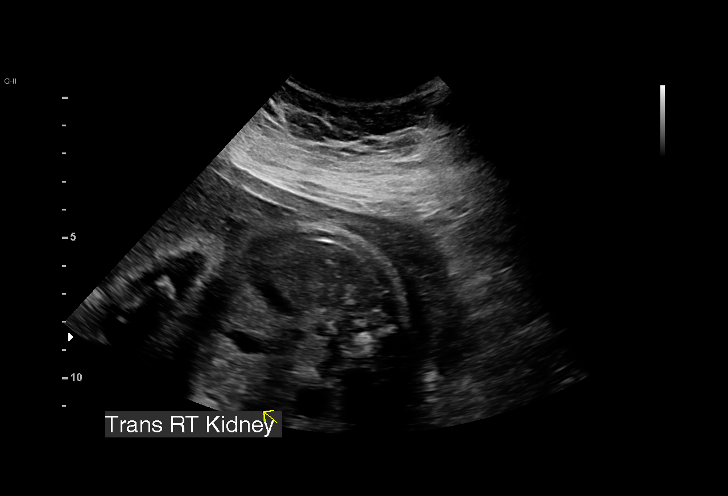
[im 13/36]
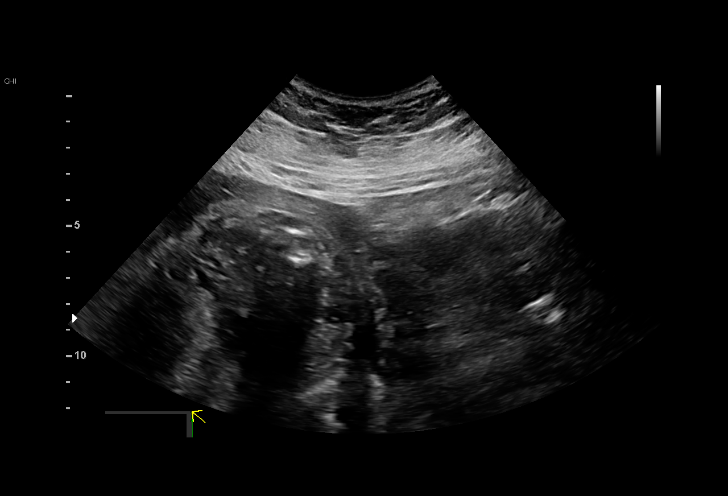
[im 16/36]
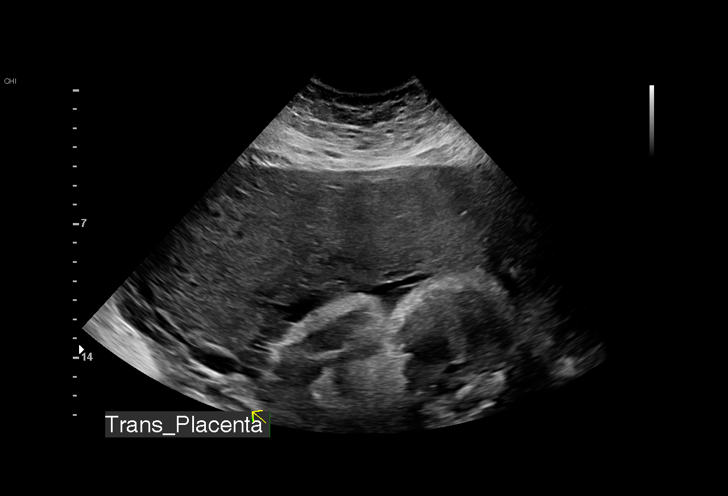
[im 19/36]
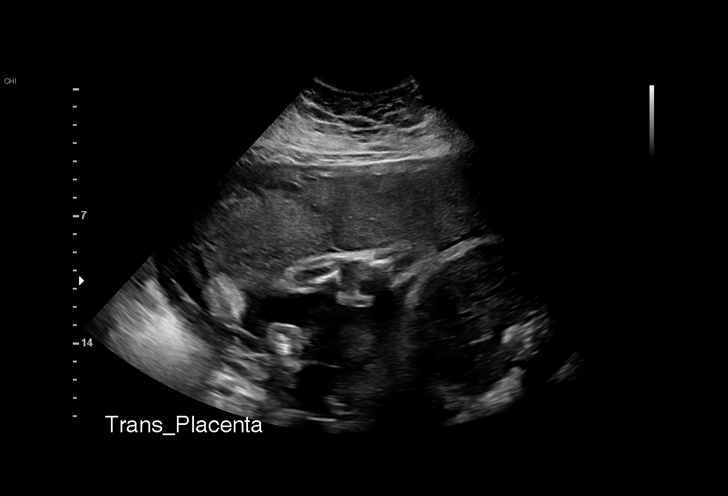
[im 20/36]
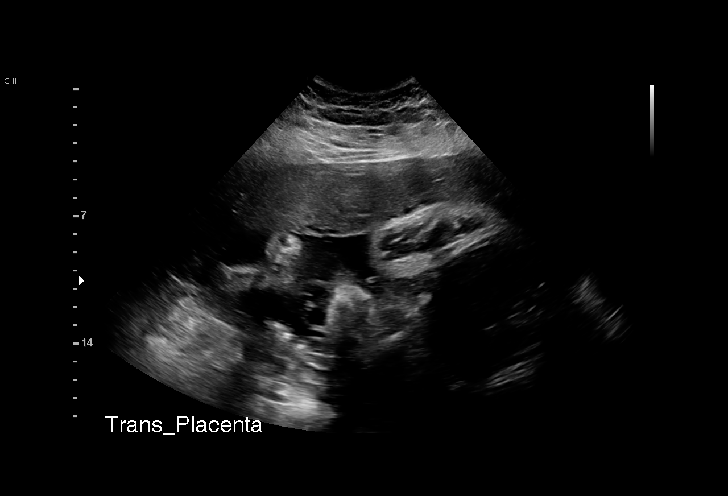
[im 23/36]
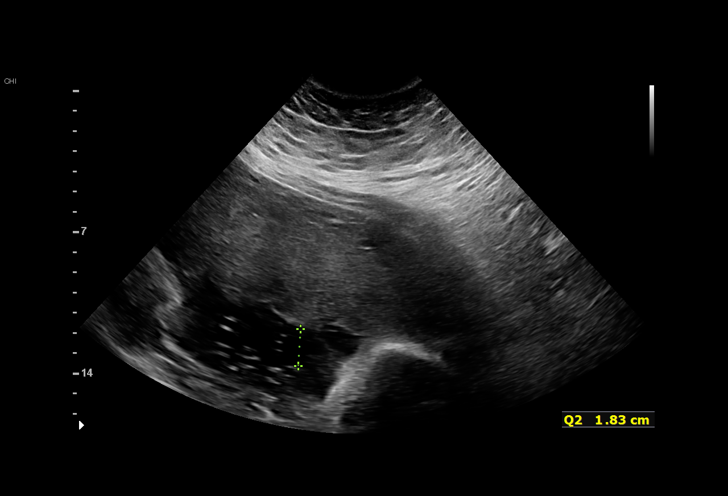
[im 25/36]
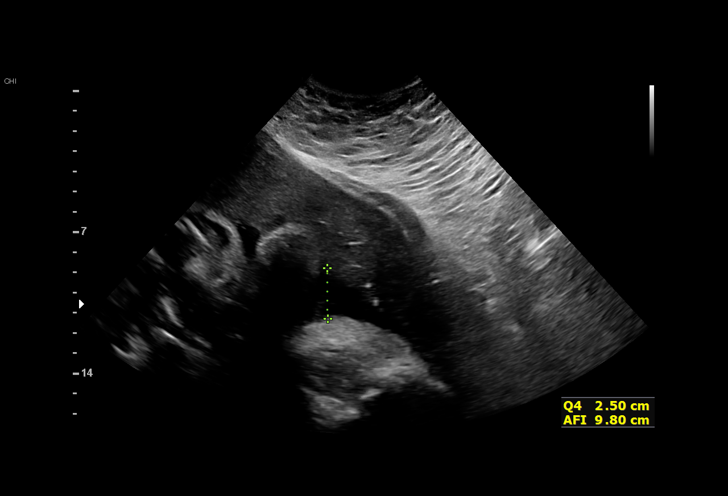
[im 28/36]
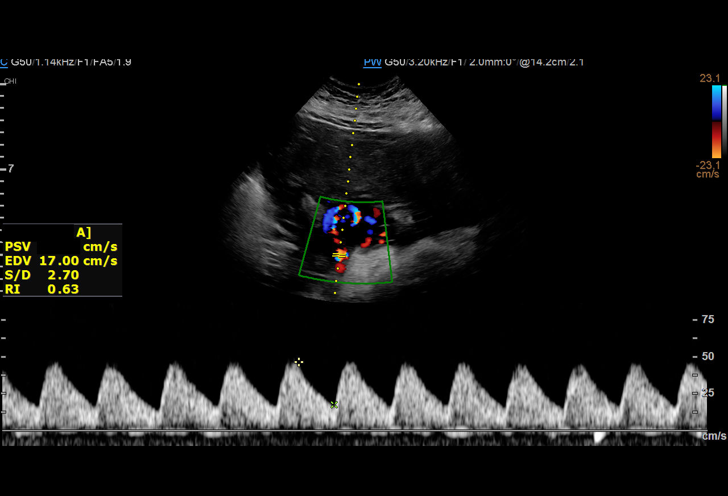
[im 30/36]
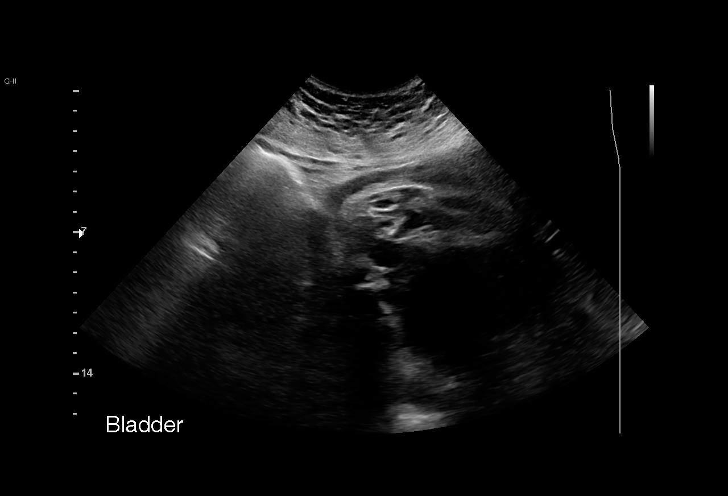
[im 33/36]
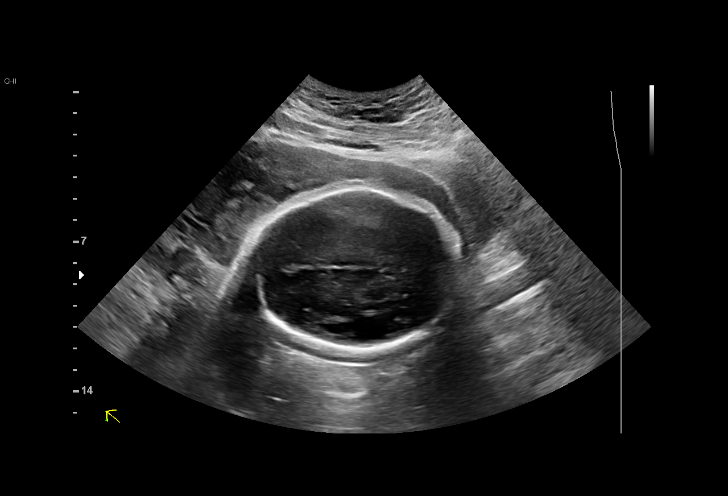
[im 36/36]
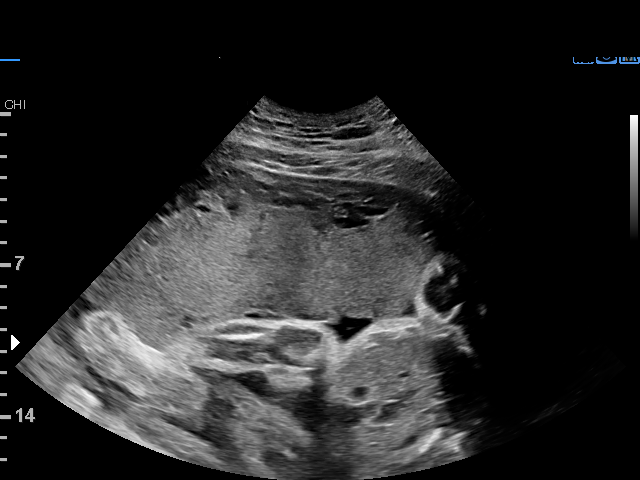

[15 of 28 positions shown; findings below may reference images not displayed]

#130

Indications

 Maternal care for known or suspected poor
 fetal growth, third trimester, not applicable or
 unspecified IUGR
 Obesity complicating pregnancy (pregravid
 BMI 65)
 Low risk NIPS, neg AFP
 31 weeks gestation of pregnancy
Fetal Evaluation

 Num Of Fetuses:         1
 Fetal Heart Rate(bpm):  135
 Cardiac Activity:       Observed
 Presentation:           Cephalic
 Placenta:               Anterior
 P. Cord Insertion:      Previously Visualized

 Amniotic Fluid
 AFI FV:      Within normal limits

 AFI Sum(cm)     %Tile       Largest Pocket(cm)
 9.8             14

 RUQ(cm)       RLQ(cm)       LUQ(cm)        LLQ(cm)

Biophysical Evaluation

 Amniotic F.V:   Pocket => 2 cm             F. Tone:        Observed
 F. Movement:    Observed                   Score:          [DATE]
 F. Breathing:   Observed
OB History

 Gravidity:    4         Term:   0        Prem:   0        SAB:   2
 TOP:          1       Ectopic:  0        Living: 0
Gestational Age

 LMP:           31w 6d        Date:  12/17/20                 EDD:   09/23/21
 Best:          31w 6d     Det. By:  LMP  (12/17/20)          EDD:   09/23/21
Anatomy

 Stomach:               Appears normal, left   Bladder:                Appears normal
                        sided
 Kidneys:               Appear normal
Doppler - Fetal Vessels

 Umbilical Artery
  S/D     %tile      RI    %tile                             ADFV    RDFV
  3.38       83     0.7       84                                No      No

Cervix Uterus Adnexa

 Cervix
 Not visualized (advanced GA >72wks)
Comments

 This patient was seen due to an IUGR fetus.  She denies any
 problems since her last exam.  She reports feeling vigorous
 fetal movements throughout the day.
 A BPP performed today was [DATE].
 There was normal amniotic fluid noted on today's ultrasound
 exam.
 Doppler studies of the umbilical arteries performed due to
 fetal growth restriction showed a normal S/D ratio of 3.38.
 There were no signs of absent or reversed end-diastolic flow
 noted today.
 She will return in 1 week for another umbilical artery Doppler
 study and biophysical profile.

## 2022-12-21 ENCOUNTER — Encounter (HOSPITAL_BASED_OUTPATIENT_CLINIC_OR_DEPARTMENT_OTHER): Payer: Self-pay | Admitting: Emergency Medicine

## 2022-12-21 ENCOUNTER — Other Ambulatory Visit: Payer: Self-pay

## 2022-12-21 ENCOUNTER — Emergency Department (HOSPITAL_BASED_OUTPATIENT_CLINIC_OR_DEPARTMENT_OTHER)
Admission: EM | Admit: 2022-12-21 | Discharge: 2022-12-21 | Disposition: A | Discharge status: Home / Self Care | Payer: Federal, State, Local not specified - PPO | Attending: Emergency Medicine | Admitting: Emergency Medicine

## 2022-12-21 ENCOUNTER — Emergency Department (HOSPITAL_BASED_OUTPATIENT_CLINIC_OR_DEPARTMENT_OTHER): Payer: Federal, State, Local not specified - PPO | Admitting: Radiology

## 2022-12-21 DIAGNOSIS — M25521 Pain in right elbow: Secondary | ICD-10-CM | POA: Diagnosis present

## 2022-12-21 MED ORDER — MELOXICAM 7.5 MG PO TABS: 7.5000 mg | ORAL_TABLET | Freq: Every day | ORAL | 0 refills | Status: AC

## 2022-12-21 MED ORDER — KETOROLAC TROMETHAMINE 30 MG/ML IJ SOLN
30.0000 mg | Freq: Once | INTRAMUSCULAR | Status: AC
  Administered 2022-12-21: 30 mg via INTRAMUSCULAR
  Filled 2022-12-21: qty 1

## 2022-12-21 MED ORDER — DICLOFENAC SODIUM 1 % EX GEL: 2.0000 g | Freq: Four times a day (QID) | CUTANEOUS | 0 refills | Status: AC

## 2022-12-21 NOTE — ED Provider Notes (Signed)
Emergency Department Provider Note   I have reviewed the triage vital signs and the nursing notes.   HISTORY  Chief Complaint Arm Pain   HPI Shannon Francis is a 32 y.o. female past history reviewed below presents emergency department with atraumatic right elbow pain.  Symptoms began in the last 48 hours.  No fevers, chills, overlying skin changes.  No injury.  She initially thought she may have slept on it wrong and took ibuprofen with rest but symptoms seem to be worse.  She is feeling some throbbing/sharp discomfort with occasional radiation into the forearm and wrist.  She denies excessive typing or obvious overuse type mechanism. No numbness. No neck pain. No shoulder pain.    Past Medical History:  Diagnosis Date   Complication of anesthesia    after a d/c nose bleed and htey had to get the bleeding to stop before they took out the intubation tube.   Headache    Morbid obesity ([place])    Pneumonia    Pre-diabetes     Review of Systems  Constitutional: No fever/chills Musculoskeletal: Positive right elbow pain.  Skin: Negative for rash. Neurological: Negative for focal weakness or numbness.   ____________________________________________   PHYSICAL EXAM:  VITAL SIGNS: ED Triage Vitals [12/21/22 0122]  Enc Vitals Group     BP (!) 153/102     Pulse Rate 73     Resp 20     Temp 98.5 F (36.9 C)     Temp Source Oral     SpO2 99 %     Weight (!) 360 lb (163.3 kg)     Height 5\' 5"  (1.651 m)   Constitutional: Alert and oriented. Well appearing and in no acute distress. Eyes: Conjunctivae are normal.  Head: Atraumatic. Nose: No congestion/rhinnorhea. Mouth/Throat: Mucous membranes are moist.   Neck: No stridor.  Cardiovascular: Normal rate, regular rhythm. Good peripheral circulation. 2+ radial pulses with normal capillary refill.  Respiratory: Normal respiratory effort.   Gastrointestinal: No distention.  Musculoskeletal: No obvious joint erythema or warmth  to touch.  Decreased range of motion of the right elbow flexion/extension but preserved range of motion of the right shoulder and wrist.  No forearm tenderness.  Compartments are soft.  No tenderness to the right wrist.  Neurologic:  Normal speech and language. Normal grip strength and sensation in the right forearm/hand. Skin:  Skin is warm, dry and intact. No rash noted.  ____________________________________________  RADIOLOGY  DG Elbow Complete Right  Result Date: 12/21/2022 CLINICAL DATA:  Right elbow pain EXAM: RIGHT ELBOW - COMPLETE 3+ VIEW COMPARISON:  None Available. FINDINGS: Normal alignment. No acute fracture or dislocation. Minimal ulnohumeral degenerative arthritis. No effusion. Mild degenerative enthesopathy involving the insertion of the common flexor tendon upon the medial epicondyle. IMPRESSION: 1. No acute fracture or dislocation. 2. Minimal ulnohumeral degenerative arthritis. Electronically Signed   By: Fidela Salisbury M.D.   On: 12/21/2022 02:47    ____________________________________________   PROCEDURES  Procedure(s) performed:   Procedures  None  ____________________________________________   INITIAL IMPRESSION / ASSESSMENT AND PLAN / ED COURSE  Pertinent labs & imaging results that were available during my care of the patient were reviewed by me and considered in my medical decision making (see chart for details).   This patient is Presenting for Evaluation of elbow pain, which does require a range of treatment options, and is a complaint that involves a moderate risk of morbidity and mortality.  The Differential Diagnoses include tendonitis,  fracture, dislocation, septic joint, etc.  Critical Interventions-    Medications  ketorolac (TORADOL) 30 MG/ML injection 30 mg (30 mg Intramuscular Given 12/21/22 0223)    Reassessment after intervention: pain improved.    I did obtain Additional Historical Information from husband at bedside.   Radiologic Tests  Ordered, included elbow x-ray. I independently interpreted the images and agree with radiology interpretation.   Medical Decision Making: Summary:  Patient presents emergency department with right elbow pain, worse with movement with decreased range of motion.  Exam is not consistent with septic arthritis.  Intact pulses and sensation.  Compartments are soft.  X-ray shows some mild arthritis but no bony abnormality.  Plan for Meloxicam, Voltaren, and orthopedics follow up.    Patient's presentation is most consistent with acute illness / injury with system symptoms.   Disposition: discharge  ____________________________________________  FINAL CLINICAL IMPRESSION(S) / ED DIAGNOSES  Final diagnoses:  Right elbow pain     NEW OUTPATIENT MEDICATIONS STARTED DURING THIS VISIT:  New Prescriptions   DICLOFENAC SODIUM (VOLTAREN) 1 % GEL    Apply 2 g topically 4 (four) times daily.   MELOXICAM (MOBIC) 7.5 MG TABLET    Take 1 tablet (7.5 mg total) by mouth daily for 7 days.    Note:  This document was prepared using Dragon voice recognition software and may include unintentional dictation errors.  Nanda Quinton, MD, [facility] Emergency Medicine    Elon Eoff, [facility], MD 12/21/22 [phone]

## 2022-12-21 NOTE — Discharge Instructions (Signed)
You were seen emergency room today with right elbow pain.  I am sending you home with prescriptions for anti-inflammation medication.  He may also take Tylenol for additional pain relief.  Please try and keep your elbow moving as best you can.  We do see some arthritis on the x-ray which may be causing some symptoms but I would like for you to follow with the orthopedic surgeon listed above.  You can call the office to schedule the first available appointment by phone.  Return to the emergency department if you develop fever, chills, or redness around the elbow joint.

## 2022-12-21 NOTE — ED Triage Notes (Signed)
  Patient comes in with R elbow pain that started yesterday morning.  Patient states she woke up and her R elbow felt like it was on fire and hurt to move.  Patient denies any injury and thought she may have slept on it wrong so she took ibuprofen and tried to rest it.  Pain has gotten worse so she came to be evaluated.  Pain 8/10, sharp/throbbing.

## 2024-01-11 ENCOUNTER — Ambulatory Visit
Admission: EM | Admit: 2024-01-11 | Discharge: 2024-01-11 | Disposition: A | Discharge status: Home / Self Care | Payer: Self-pay | Attending: Family Medicine | Admitting: Family Medicine

## 2024-01-11 ENCOUNTER — Ambulatory Visit: Payer: Self-pay

## 2024-01-11 ENCOUNTER — Other Ambulatory Visit: Payer: Self-pay

## 2024-01-11 DIAGNOSIS — R102 Pelvic and perineal pain: Secondary | ICD-10-CM | POA: Insufficient documentation

## 2024-01-11 DIAGNOSIS — N3001 Acute cystitis with hematuria: Secondary | ICD-10-CM | POA: Insufficient documentation

## 2024-01-11 LAB — POCT URINALYSIS DIP (MANUAL ENTRY)
Bilirubin, UA: NEGATIVE
Glucose, UA: NEGATIVE mg/dL
Ketones, POC UA: NEGATIVE mg/dL
Nitrite, UA: NEGATIVE
Spec Grav, UA: 1.025 (ref 1.010–1.025)
Urobilinogen, UA: 0.2 U/dL
pH, UA: 6.5 (ref 5.0–8.0)

## 2024-01-11 LAB — POCT URINE PREGNANCY: Preg Test, Ur: NEGATIVE

## 2024-01-11 MED ORDER — NAPROXEN 500 MG PO TABS: 500.0000 mg | ORAL_TABLET | Freq: Two times a day (BID) | ORAL | 0 refills | Status: AC

## 2024-01-11 MED ORDER — CEPHALEXIN 500 MG PO CAPS: 500.0000 mg | ORAL_CAPSULE | Freq: Two times a day (BID) | ORAL | 0 refills | Status: DC

## 2024-01-11 NOTE — ED Provider Notes (Signed)
[facility]  Note:  This document was prepared using Conservation officer, historic buildings and may include unintentional dictation errors.  MRN: [id number] DOB: [birth year]  Subjective:   Shannon Francis is a 33 y.o. female presenting for 2-day history of left lower abdominal pain, pelvic pressure, pelvic pain while urinating. Pain alternates between left and right lower abdominal/pelvic areas.  No vaginal discharge but is having vaginal bleeding, believes that her cycle.  Has used ibuprofen.  No concern about STI but is not a posterior vaginal cytology.  Has history of UTI.  No current facility-administered medications for this encounter.  Current Outpatient Medications:    albuterol (VENTOLIN HFA) 108 (90 Base) MCG/ACT inhaler, Inhale 2 puffs into the lungs every 6 (six) hours as needed for wheezing or shortness of breath., Disp: , Rfl:    aspirin-acetaminophen-caffeine (EXCEDRIN MIGRAINE) 250-250-65 MG tablet, Take 2 tablets by mouth every 8 (eight) hours as needed for headache or migraine., Disp: , Rfl:    diclofenac Sodium (VOLTAREN) 1 % GEL, Apply 2 g topically 4 (four) times daily., Disp: 100 g, Rfl: 0   ferrous sulfate (FERROUSUL) 325 (65 FE) MG tablet, Take 1 tablet (325 mg total) by mouth daily with breakfast., Disp: 30 tablet, Rfl: 11   furosemide (LASIX) 20 MG tablet, Take 20 mg by mouth daily., Disp: , Rfl:    Allergies  Allergen Reactions   Hydrocodone Nausea And Vomiting and Other (See Comments)    dizziness    Past Medical History:  Diagnosis Date   Complication of anesthesia    after a d/c nose bleed and htey had to get the bleeding to stop before they took out the intubation tube.   Headache    Morbid obesity ([facility])    Pneumonia    Pre-diabetes      Past Surgical History:  Procedure Laterality Date   ANTERIOR CRUCIATE LIGAMENT REPAIR     left   CESAREAN SECTION N/A 09/10/2021   Procedure: CESAREAN SECTION;  Surgeon: Osborn Coho, MD;   Location: [facility] OR[facility];  Service: Obstetrics;  Laterality: N/A;   DILATATION & CURETTAGE/HYSTEROSCOPY WITH MYOSURE N/A 08/13/2018   Procedure: DILATATION & CURETTAGE/HYSTEROSCOPY WITH MYOSURE;  Surgeon: Olivia Mackie, MD;  Location: [facility] ORS;  Service: Gynecology;  Laterality: N/A;   DILATATION & CURETTAGE/HYSTEROSCOPY WITH MYOSURE N/A 10/16/2022   Procedure: DILATATION & CURETTAGE/HYSTEROSCOPY WITH MYOSURE;  Surgeon: Jackie Plum, MD;  Location: [facility] OR;  Service: Gynecology;  Laterality: N/A;   INCISION AND DRAINAGE     chest   KNEE SURGERY      Family History  Problem Relation Age of Onset   Hypertension Mother    Diabetes Mother    Stroke Mother    Cancer Sister    Diabetes Other    Healthy Father     Social History   Tobacco Use   Smoking status: Never   Smokeless tobacco: Never  Vaping Use   Vaping status: Never Used  Substance Use Topics   Alcohol use: No   Drug use: No    ROS   Objective:   Vitals: BP (!) 170/100 (BP Location: Right Arm)   Pulse 89   Temp 98.9 F (37.2 C) (Oral)   Resp 18   LMP 01/11/2024   SpO2 94%   Physical Exam Constitutional:      General: She is not in acute distress.    Appearance: Normal appearance. She is well-developed. She is not ill-appearing, toxic-appearing or diaphoretic.  HENT:     Head: Normocephalic and atraumatic.     Nose: Nose normal.     Mouth/Throat:     Mouth: Mucous membranes are moist.     Pharynx: Oropharynx is clear.  Eyes:     General: No scleral icterus.       Right eye: No discharge.        Left eye: No discharge.     Extraocular Movements: Extraocular movements intact.     Conjunctiva/sclera: Conjunctivae normal.  Cardiovascular:     Rate and Rhythm: Normal rate.  Pulmonary:     Effort: Pulmonary effort is normal.  Abdominal:     General: Bowel sounds are normal. There is no distension.     Palpations: Abdomen is soft. There is no mass.     Tenderness: There is abdominal tenderness in the right  lower quadrant, periumbilical area, suprapubic area and left lower quadrant. There is no right CVA tenderness, left CVA tenderness, guarding or rebound.  Skin:    General: Skin is warm and dry.  Neurological:     General: No focal deficit present.     Mental Status: She is alert and oriented to person, place, and time.  Psychiatric:        Mood and Affect: Mood normal.        Behavior: Behavior normal.        Thought Content: Thought content normal.        Judgment: Judgment normal.     Results for orders placed or performed during the hospital encounter of 01/11/24 (from the past 24 hours)  POCT urine pregnancy     Status: None   Collection Time: 01/11/24 10:00 AM  Result Value Ref Range   Preg Test, Ur Negative Negative  POCT urinalysis dipstick     Status: Abnormal   Collection Time: 01/11/24 10:00 AM  Result Value Ref Range   Color, UA straw (A) yellow   Clarity, UA turbid (A) clear   Glucose, UA negative negative mg/dL   Bilirubin, UA negative negative   Ketones, POC UA negative negative mg/dL   Spec Grav, UA 1.610 9.604 - 1.025   Blood, UA moderate (A) negative   pH, UA 6.5 5.0 - 8.0   Protein Ur, POC trace (A) negative mg/dL   Urobilinogen, UA 0.2 0.2 or 1.0 E.U./dL   Nitrite, UA Negative Negative   Leukocytes, UA Small (1+) (A) Negative    Assessment and Plan :   PDMP not reviewed this encounter.  1. Acute cystitis with hematuria   2. Suprapubic pain    Start Keflex to cover for acute cystitis, urine culture pending.  Recommended aggressive hydration, limiting urinary irritants. Counseled patient on potential for adverse effects with medications prescribed/recommended today, ER and return-to-clinic precautions discussed, patient verbalized understanding.    Wallis Bamberg, PA-C 01/11/24 1055

## 2024-01-11 NOTE — ED Triage Notes (Signed)
Patient C/O right sided abdominal pain x 2 days. Patient states she has increased pain with laying on her right side and when her bladder is full. Denies dysuria. Patient states she is taking ibuprofen without relief. Patient denies injury.

## 2024-01-11 NOTE — Discharge Instructions (Signed)
Please start Keflex (cephalexin) to address an urinary tract infection. Make sure you hydrate very well with plain water and a quantity of 80 ounces of water a day.  Please limit drinks that are considered urinary irritants such as soda, sweet tea, coffee, energy drinks, alcohol.  These can worsen your urinary and genital symptoms but also be the source of them.  I will let you know about your urine culture and vaginal cytology results through MyChart to see if we need to prescribe or change your antibiotics based off of those results.

## 2024-01-12 ENCOUNTER — Encounter (HOSPITAL_COMMUNITY): Payer: Self-pay | Admitting: *Deleted

## 2024-01-12 ENCOUNTER — Ambulatory Visit: Payer: Self-pay

## 2024-01-12 ENCOUNTER — Other Ambulatory Visit: Payer: Self-pay

## 2024-01-12 ENCOUNTER — Emergency Department (HOSPITAL_COMMUNITY)
Admission: EM | Admit: 2024-01-12 | Discharge: 2024-01-13 | Disposition: A | Discharge status: Home / Self Care | Payer: Self-pay | Attending: Emergency Medicine | Admitting: Emergency Medicine

## 2024-01-12 DIAGNOSIS — K8020 Calculus of gallbladder without cholecystitis without obstruction: Secondary | ICD-10-CM | POA: Diagnosis not present

## 2024-01-12 DIAGNOSIS — D72829 Elevated white blood cell count, unspecified: Secondary | ICD-10-CM | POA: Insufficient documentation

## 2024-01-12 DIAGNOSIS — J189 Pneumonia, unspecified organism: Secondary | ICD-10-CM

## 2024-01-12 DIAGNOSIS — R1011 Right upper quadrant pain: Secondary | ICD-10-CM | POA: Diagnosis present

## 2024-01-12 DIAGNOSIS — N390 Urinary tract infection, site not specified: Secondary | ICD-10-CM

## 2024-01-12 LAB — URINE CULTURE

## 2024-01-12 MED ORDER — OXYCODONE-ACETAMINOPHEN 5-325 MG PO TABS
1.0000 | ORAL_TABLET | Freq: Once | ORAL | Status: AC
  Administered 2024-01-12: 1 via ORAL
  Filled 2024-01-12: qty 1

## 2024-01-12 MED ORDER — ONDANSETRON 4 MG PO TBDP
4.0000 mg | ORAL_TABLET | Freq: Once | ORAL | Status: AC
  Administered 2024-01-12: 4 mg via ORAL
  Filled 2024-01-12: qty 1

## 2024-01-12 NOTE — ED Provider Triage Note (Signed)
Emergency Medicine Provider Triage Evaluation Note  Shannon Francis , a 33 y.o. female  was evaluated in triage.  Pt complains of abdominal pain.  Review of Systems  Positive: RUQ pain, nausea, UTI sxs Negative: Vomiting, bloody stools  Physical Exam  BP (!) 169/122   Pulse 99   Temp 99 F (37.2 C) (Oral)   Resp 20   LMP 01/11/2024   SpO2 95%  Gen:   Awake, no distress  Uncomfortable Resp:  Normal effort  MSK:   Moves extremities without difficulty  Other:  RUQ mildly tender  Medical Decision Making  Medically screening exam initiated at 10:02 PM.  Appropriate orders placed.  Shannon Francis was informed that the remainder of the evaluation will be completed by another provider, this initial triage assessment does not replace that evaluation, and the importance of remaining in the ED until their evaluation is complete.  RUQ pain, pain with full bladder x 2 days. Seen at Urgent Care, on abx for UTI but getting worse. Hurts in RUQ with leaning on the right, deep breath. No post-prandial pain.     Shannon Anis, PA-C 01/12/24 2204

## 2024-01-12 NOTE — ED Triage Notes (Signed)
Pt c/o RUQ pain and bladder fullness. Pain started 2 days ago; was seen at [facility] for same; pain is constant in nature. Intermittent fevers

## 2024-01-13 ENCOUNTER — Emergency Department (HOSPITAL_COMMUNITY): Payer: Self-pay

## 2024-01-13 LAB — CBC WITH DIFFERENTIAL/PLATELET
Abs Immature Granulocytes: 0.03 10*3/uL (ref 0.00–0.07)
Basophils Absolute: 0 10*3/uL (ref 0.0–0.1)
Basophils Relative: 0 %
Eosinophils Absolute: 0.1 10*3/uL (ref 0.0–0.5)
Eosinophils Relative: 1 %
HCT: 39.5 % (ref 36.0–46.0)
Hemoglobin: 12.1 g/dL (ref 12.0–15.0)
Immature Granulocytes: 0 %
Lymphocytes Relative: 17 %
Lymphs Abs: 2.1 10*3/uL (ref 0.7–4.0)
MCH: 25.5 pg — ABNORMAL LOW (ref 26.0–34.0)
MCHC: 30.6 g/dL (ref 30.0–36.0)
MCV: 83.3 fL (ref 80.0–100.0)
Monocytes Absolute: 1 10*3/uL (ref 0.1–1.0)
Monocytes Relative: 8 %
Neutro Abs: 8.8 10*3/uL — ABNORMAL HIGH (ref 1.7–7.7)
Neutrophils Relative %: 74 %
Platelets: 326 10*3/uL (ref 150–400)
RBC: 4.74 MIL/uL (ref 3.87–5.11)
RDW: 16.7 % — ABNORMAL HIGH (ref 11.5–15.5)
WBC: 12 10*3/uL — ABNORMAL HIGH (ref 4.0–10.5)
nRBC: 0 % (ref 0.0–0.2)

## 2024-01-13 LAB — COMPREHENSIVE METABOLIC PANEL
ALT: 21 U/L (ref 0–44)
AST: 18 U/L (ref 15–41)
Albumin: 3.7 g/dL (ref 3.5–5.0)
Alkaline Phosphatase: 103 U/L (ref 38–126)
Anion gap: 11 (ref 5–15)
BUN: 21 mg/dL — ABNORMAL HIGH (ref 6–20)
CO2: 25 mmol/L (ref 22–32)
Calcium: 9.1 mg/dL (ref 8.9–10.3)
Chloride: 103 mmol/L (ref 98–111)
Creatinine, Ser: 0.86 mg/dL (ref 0.44–1.00)
GFR, Estimated: >60 mL/min (ref >60)
Glucose, Bld: 129 mg/dL — ABNORMAL HIGH (ref 70–99)
Potassium: 3.7 mmol/L (ref 3.5–5.1)
Sodium: 139 mmol/L (ref 135–145)
Total Bilirubin: 1.1 mg/dL (ref 0.0–1.2)
Total Protein: 8.7 g/dL — ABNORMAL HIGH (ref 6.5–8.1)

## 2024-01-13 LAB — URINALYSIS, W/ REFLEX TO CULTURE (INFECTION SUSPECTED)
Bilirubin Urine: NEGATIVE
Glucose, UA: NEGATIVE mg/dL
Ketones, ur: NEGATIVE mg/dL
Nitrite: NEGATIVE
Protein, ur: 100 mg/dL — AB
Specific Gravity, Urine: 1.04 — ABNORMAL HIGH (ref 1.005–1.030)
pH: 5 (ref 5.0–8.0)

## 2024-01-13 LAB — LIPASE, BLOOD: Lipase: 37 U/L (ref 11–51)

## 2024-01-13 LAB — PREGNANCY, URINE: Preg Test, Ur: NEGATIVE

## 2024-01-13 MED ORDER — SODIUM CHLORIDE 0.9 % IV BOLUS
1000.0000 mL | Freq: Once | INTRAVENOUS | Status: AC
Start: 2024-01-13 — End: 2024-01-13
  Administered 2024-01-13: 1000 mL via INTRAVENOUS

## 2024-01-13 MED ORDER — ONDANSETRON 8 MG PO TBDP: 8.0000 mg | ORAL_TABLET | Freq: Three times a day (TID) | ORAL | 0 refills | Status: AC | PRN

## 2024-01-13 MED ORDER — ONDANSETRON HCL 4 MG/2ML IJ SOLN
4.0000 mg | Freq: Once | INTRAMUSCULAR | Status: AC
  Administered 2024-01-13: 4 mg via INTRAVENOUS
  Filled 2024-01-13: qty 2

## 2024-01-13 MED ORDER — IOHEXOL 300 MG/ML  SOLN
100.0000 mL | Freq: Once | INTRAMUSCULAR | Status: AC | PRN
  Administered 2024-01-13: 100 mL via INTRAVENOUS

## 2024-01-13 MED ORDER — LEVOFLOXACIN 500 MG PO TABS: 500.0000 mg | ORAL_TABLET | Freq: Every day | ORAL | 0 refills | Status: AC

## 2024-01-13 MED ORDER — HYDROMORPHONE HCL 1 MG/ML IJ SOLN
1.0000 mg | Freq: Once | INTRAMUSCULAR | Status: AC
  Administered 2024-01-13: 1 mg via INTRAVENOUS
  Filled 2024-01-13: qty 1

## 2024-01-13 MED ORDER — CEFTRIAXONE SODIUM 1 G IJ SOLR
1.0000 g | Freq: Once | INTRAMUSCULAR | Status: AC
  Administered 2024-01-13: 1 g via INTRAVENOUS
  Filled 2024-01-13: qty 10

## 2024-01-13 NOTE — ED Provider Notes (Signed)
Patient was initially seen by Dr. Oletta Cohn.  Please see his note.  Plan was to follow-up on the patient's CT scan.  CT scan does not show any acute findings in the abdomen or pelvis.  Patient's ultrasound previously did demonstrate gallstones but no signs of cholecystitis.  It is possible she is having symptoms of biliary colic.  CT scan does suggest the possibility of multifocal pneumonia.  Patient denies any respiratory symptoms.  She says she has had a little bit of congestion.  Will change her antibiotic regimen to the Levaquin as it is possible that she has UTI not responding to the Keflex as well as coverage for pneumonia.  Discussed outpatient follow-up with PCP and general surgery.   Linwood Dibbles, MD 01/13/24 [phone]

## 2024-01-13 NOTE — Discharge Instructions (Addendum)
You can stop taking the Keflex antibiotic and start taking the Levaquin.  Follow-up with a primary care doctor to make sure your symptoms are improving.  We also recommend following up with a general surgeon to discuss further treatment of your gallstones.  Return to the ER for severe pain fevers chills or other concerning symptoms

## 2024-01-13 NOTE — ED Provider Notes (Signed)
[place] EMERGENCY DEPARTMENT AT [facility] Provider Note   CSN: [id number] Arrival date & time: 01/12/24  2146     History  Chief Complaint  Patient presents with   Abdominal Pain    Shannon Francis is a 33 y.o. female.  Patient presents to the emergency department for evaluation of abdominal pain.  Patient reports symptoms have been ongoing for several days.  Pain has been moving around her abdomen but tonight the pain is persistent in the right upper abdominal area.       Home Medications Prior to Admission medications   Medication Sig Start Date End Date Taking? Authorizing Provider  albuterol (VENTOLIN HFA) 108 (90 Base) MCG/ACT inhaler Inhale 2 puffs into the lungs every 6 (six) hours as needed for wheezing or shortness of breath.    [provider]  aspirin-acetaminophen-caffeine (EXCEDRIN MIGRAINE) [phone] MG tablet Take 2 tablets by mouth every 8 (eight) hours as needed for headache or migraine.    [provider]  cephALEXin (KEFLEX) 500 MG capsule Take 1 capsule (500 mg total) by mouth 2 (two) times daily. 01/11/24   Wallis Bamberg, PA-C  diclofenac Sodium (VOLTAREN) 1 % GEL Apply 2 g topically 4 (four) times daily. 12/21/22   Long, Arlyss Repress, MD  ferrous sulfate (FERROUSUL) 325 (65 FE) MG tablet Take 1 tablet (325 mg total) by mouth daily with breakfast. 10/16/22 10/16/23  Jackie Plum, MD  furosemide (LASIX) 20 MG tablet Take 20 mg by mouth daily. 09/08/22   [provider]  naproxen (NAPROSYN) 500 MG tablet Take 1 tablet (500 mg total) by mouth 2 (two) times daily with a meal. 01/11/24   Wallis Bamberg, PA-C      Allergies    Hydrocodone    Review of Systems   Review of Systems  Physical Exam Updated Vital Signs BP (!) 146/82 (BP Location: Right Arm)   Pulse 88   Temp 98.2 F (36.8 C) (Oral)   Resp 18   LMP 01/11/2024   SpO2 95%  Physical Exam Vitals and nursing note reviewed.  Constitutional:      General: She is  not in acute distress.    Appearance: She is well-developed.  HENT:     Head: Normocephalic and atraumatic.     Mouth/Throat:     Mouth: Mucous membranes are moist.  Eyes:     General: Vision grossly intact. Gaze aligned appropriately.     Extraocular Movements: Extraocular movements intact.     Conjunctiva/sclera: Conjunctivae normal.  Cardiovascular:     Rate and Rhythm: Normal rate and regular rhythm.     Pulses: Normal pulses.     Heart sounds: Normal heart sounds, S1 normal and S2 normal. No murmur heard.    No friction rub. No gallop.  Pulmonary:     Effort: Pulmonary effort is normal. No respiratory distress.     Breath sounds: Normal breath sounds.  Abdominal:     General: Bowel sounds are normal.     Palpations: Abdomen is soft.     Tenderness: There is abdominal tenderness in the right upper quadrant. There is no guarding or rebound.     Hernia: No hernia is present.  Musculoskeletal:        General: No swelling.     Cervical back: Full passive range of motion without pain, normal range of motion and neck supple. No spinous process tenderness or muscular tenderness. Normal range of motion.     Right lower leg:  No edema.     Left lower leg: No edema.  Skin:    General: Skin is warm and dry.     Capillary Refill: Capillary refill takes less than 2 seconds.     Findings: No ecchymosis, erythema, rash or wound.  Neurological:     General: No focal deficit present.     Mental Status: She is alert and oriented to person, place, and time.     GCS: GCS eye subscore is 4. GCS verbal subscore is 5. GCS motor subscore is 6.     Cranial Nerves: Cranial nerves 2-12 are intact.     Sensory: Sensation is intact.     Motor: Motor function is intact.     Coordination: Coordination is intact.  Psychiatric:        Attention and Perception: Attention normal.        Mood and Affect: Mood normal.        Speech: Speech normal.        Behavior: Behavior normal.     ED Results /  Procedures / Treatments   Labs (all labs ordered are listed, but only abnormal results are displayed) Labs Reviewed  CBC WITH DIFFERENTIAL/PLATELET - Abnormal; Notable for the following components:      Result Value   WBC 12.0 (*)    MCH 25.5 (*)    RDW 16.7 (*)    Neutro Abs 8.8 (*)    All other components within normal limits  COMPREHENSIVE METABOLIC PANEL - Abnormal; Notable for the following components:   Glucose, Bld 129 (*)    BUN 21 (*)    Total Protein 8.7 (*)    All other components within normal limits  URINALYSIS, W/ REFLEX TO CULTURE (INFECTION SUSPECTED) - Abnormal; Notable for the following components:   APPearance TURBID (*)    Specific Gravity, Urine 1.040 (*)    Hgb urine dipstick MODERATE (*)    Protein, ur 100 (*)    Leukocytes,Ua MODERATE (*)    Bacteria, UA FEW (*)    All other components within normal limits  URINE CULTURE  LIPASE, BLOOD  PREGNANCY, URINE    EKG None  Radiology US ABDOMEN LIMITED RUQ (LIVER/GB) Result Date: 01/13/2024 CLINICAL DATA:  Upper abdominal pain EXAM: ULTRASOUND ABDOMEN LIMITED RIGHT UPPER QUADRANT COMPARISON:  Abdominal CT 12/15/2021 FINDINGS: Gallbladder: Shadowing calculi and diffuse mid level echoes. No gallbladder wall thickening or over distention. No pericholecystic edema or focal tenderness. As confirmed directly with the technologist, no sonographic Murphy sign. Common bile duct: Diameter: 4 mm Liver: No focal lesion identified. Heterogeneous liver without focal lesion seen, relatively echogenic with poor acoustic penetration. Portal vein is patent on color Doppler imaging with normal direction of blood flow towards the liver. IMPRESSION: 1. Cholelithiasis and gallbladder sludge without signs of acute cholecystitis. 2. Hepatocellular disease usually reflecting steatosis. Electronically Signed   By: Tiburcio Pea M.D.   On: 01/13/2024 06:12    Procedures Procedures    Medications Ordered in ED Medications  cefTRIAXone  (ROCEPHIN) 1 g in sodium chloride 0.9 % 100 mL IVPB (1 g Intravenous New Bag/Given 01/13/24 0615)  oxyCODONE-acetaminophen (PERCOCET/ROXICET) 5-325 MG per tablet 1 tablet (1 tablet Oral Given 01/12/24 2207)  ondansetron (ZOFRAN-ODT) disintegrating tablet 4 mg (4 mg Oral Given 01/12/24 2207)  HYDROmorphone (DILAUDID) injection 1 mg (1 mg Intravenous Given 01/13/24 0503)  ondansetron (ZOFRAN) injection 4 mg (4 mg Intravenous Given 01/13/24 0509)  sodium chloride 0.9 % bolus 1,000 mL (1,000 mLs Intravenous New  Bag/Given 01/13/24 1610)    ED Course/ Medical Decision Making/ A&P                                 Medical Decision Making Amount and/or Complexity of Data Reviewed Radiology: ordered.  Risk Prescription drug management.   Differential Diagnosis considered includes, but not limited to: Cholelithiasis; cholecystitis; cholangitis; bowel obstruction; esophagitis; gastritis; peptic ulcer disease; pancreatitis; cardiac.  Presents to the emergency department for evaluation of abdominal pain.  Symptoms have been ongoing for a couple of days.  Reviewing her urgent care records reveals that the pain has jumped around to different areas of her abdomen over the 2 days.  Today she has isolated right upper quadrant pain.  Patient with mild leukocytosis.  LFTs, lipase normal.  No anemia.  Urinalysis suggest infection.  Ministered Rocephin.  Patient given analgesia.  Urinalysis was also concentrated, had some ketones, appears mildly dehydrated.  Given IV fluids.  Upon recheck after analgesia, patient reports feeling much better.  Has slight pain, but much improved.  Upper quadrant ultrasound does show a gallstone without evidence of cholecystitis.  Discussed treatment for early RA colic and UTI versus further evaluation and patient would like to undergo CT scan to ensure there are no other problems.  Will sign out to oncoming ER physician to follow-up on CT, disposition accordingly.        Final  Clinical Impression(s) / ED Diagnoses Final diagnoses:  Calculus of gallbladder without cholecystitis without obstruction  Urinary tract infection without hematuria, site unspecified    Rx / DC Orders ED Discharge Orders     None         Gilda Crease, MD 01/13/24 [phone]

## 2024-01-14 ENCOUNTER — Telehealth (HOSPITAL_COMMUNITY): Payer: Self-pay

## 2024-01-14 LAB — CERVICOVAGINAL ANCILLARY ONLY
Bacterial Vaginitis (gardnerella): POSITIVE — AB
Chlamydia: POSITIVE — AB
Comment: NEGATIVE
Comment: NEGATIVE
Comment: NEGATIVE
Comment: NORMAL
Neisseria Gonorrhea: NEGATIVE
Trichomonas: NEGATIVE

## 2024-01-14 LAB — URINE CULTURE: Culture: NO GROWTH

## 2024-01-14 MED ORDER — DOXYCYCLINE HYCLATE 100 MG PO TABS: 100.0000 mg | ORAL_TABLET | Freq: Two times a day (BID) | ORAL | 0 refills | Status: AC

## 2024-01-14 MED ORDER — METRONIDAZOLE 500 MG PO TABS
500.0000 mg | ORAL_TABLET | Freq: Two times a day (BID) | ORAL | 0 refills | Status: AC
Start: 2024-01-14 — End: 2024-01-21

## 2024-01-14 NOTE — Telephone Encounter (Signed)
Per protocol, pt requires tx with Doxycycline and metronidazole. Attempted to reach patient x1. LVM. Rx sent to pharmacy on file.

## 2025-01-12 ENCOUNTER — Encounter: Payer: Self-pay | Admitting: Obstetrics and Gynecology
